# Patient Record
Sex: Female | Born: 1960 | Race: White | Hispanic: No | Marital: Married | Smoking: Current every day smoker
Health system: Southern US, Community
[De-identification: ages and names within clinical notes are randomized; demographics above are authoritative.]

## PROBLEM LIST (undated history)

## (undated) DIAGNOSIS — F1120 Opioid dependence, uncomplicated: Secondary | ICD-10-CM

## (undated) DIAGNOSIS — M21372 Foot drop, left foot: Secondary | ICD-10-CM

## (undated) DIAGNOSIS — J45909 Unspecified asthma, uncomplicated: Secondary | ICD-10-CM

## (undated) DIAGNOSIS — I10 Essential (primary) hypertension: Secondary | ICD-10-CM

## (undated) DIAGNOSIS — M21371 Foot drop, right foot: Secondary | ICD-10-CM

## (undated) DIAGNOSIS — K219 Gastro-esophageal reflux disease without esophagitis: Secondary | ICD-10-CM

## (undated) DIAGNOSIS — F419 Anxiety disorder, unspecified: Secondary | ICD-10-CM

## (undated) DIAGNOSIS — M549 Dorsalgia, unspecified: Secondary | ICD-10-CM

## (undated) DIAGNOSIS — M1990 Unspecified osteoarthritis, unspecified site: Secondary | ICD-10-CM

## (undated) DIAGNOSIS — R011 Cardiac murmur, unspecified: Secondary | ICD-10-CM

## (undated) DIAGNOSIS — G8929 Other chronic pain: Secondary | ICD-10-CM

## (undated) DIAGNOSIS — F32A Depression, unspecified: Secondary | ICD-10-CM

## (undated) DIAGNOSIS — C4490 Unspecified malignant neoplasm of skin, unspecified: Secondary | ICD-10-CM

## (undated) DIAGNOSIS — Z87891 Personal history of nicotine dependence: Secondary | ICD-10-CM

## (undated) HISTORY — PX: ABDOMINAL HYSTERECTOMY: SHX81

## (undated) HISTORY — DX: Unspecified malignant neoplasm of skin, unspecified: C44.90

## (undated) HISTORY — PX: ANKLE SURGERY: SHX546

## (undated) HISTORY — PX: APPENDECTOMY: SHX54

## (undated) HISTORY — DX: Foot drop, right foot: M21.371

## (undated) HISTORY — DX: Other chronic pain: G89.29

## (undated) HISTORY — DX: Foot drop, right foot: M21.372

## (undated) HISTORY — DX: Unspecified osteoarthritis, unspecified site: M19.90

## (undated) HISTORY — DX: Dorsalgia, unspecified: M54.9

## (undated) HISTORY — DX: Unspecified asthma, uncomplicated: J45.909

## (undated) HISTORY — PX: TENNIS ELBOW RELEASE/NIRSCHEL PROCEDURE: SHX6651

---

## 2004-10-18 ENCOUNTER — Emergency Department: Payer: Self-pay | Admitting: Emergency Medicine

## 2005-03-29 ENCOUNTER — Ambulatory Visit: Payer: Self-pay | Admitting: Family Medicine

## 2005-04-08 ENCOUNTER — Ambulatory Visit: Payer: Self-pay | Admitting: Podiatry

## 2005-08-12 ENCOUNTER — Ambulatory Visit: Payer: Self-pay | Admitting: Otolaryngology

## 2006-03-29 ENCOUNTER — Ambulatory Visit: Payer: Self-pay | Admitting: Podiatry

## 2006-05-31 ENCOUNTER — Ambulatory Visit: Payer: Self-pay | Admitting: Family Medicine

## 2006-06-17 ENCOUNTER — Ambulatory Visit: Payer: Self-pay | Admitting: Podiatry

## 2006-10-11 ENCOUNTER — Ambulatory Visit: Payer: Self-pay | Admitting: Podiatry

## 2006-10-21 ENCOUNTER — Ambulatory Visit: Payer: Self-pay | Admitting: Family Medicine

## 2006-10-25 ENCOUNTER — Ambulatory Visit: Payer: Self-pay | Admitting: Family Medicine

## 2007-01-25 ENCOUNTER — Other Ambulatory Visit: Payer: Self-pay

## 2007-01-25 ENCOUNTER — Ambulatory Visit: Payer: Self-pay | Admitting: Obstetrics and Gynecology

## 2007-02-06 ENCOUNTER — Inpatient Hospital Stay: Payer: Self-pay | Admitting: Obstetrics and Gynecology

## 2007-04-13 ENCOUNTER — Ambulatory Visit: Payer: Self-pay | Admitting: Family Medicine

## 2007-06-07 ENCOUNTER — Ambulatory Visit: Payer: Self-pay | Admitting: Family Medicine

## 2007-06-13 ENCOUNTER — Ambulatory Visit: Payer: Self-pay

## 2007-06-28 ENCOUNTER — Ambulatory Visit: Payer: Self-pay | Admitting: Pain Medicine

## 2007-07-18 ENCOUNTER — Ambulatory Visit: Payer: Self-pay | Admitting: Pain Medicine

## 2007-08-07 ENCOUNTER — Ambulatory Visit: Payer: Self-pay | Admitting: Physician Assistant

## 2007-08-24 ENCOUNTER — Ambulatory Visit: Payer: Self-pay | Admitting: Pain Medicine

## 2007-09-19 ENCOUNTER — Ambulatory Visit: Payer: Self-pay | Admitting: Pain Medicine

## 2007-10-03 ENCOUNTER — Ambulatory Visit: Payer: Self-pay | Admitting: Physician Assistant

## 2007-10-04 ENCOUNTER — Ambulatory Visit: Payer: Self-pay | Admitting: Physician Assistant

## 2007-10-31 ENCOUNTER — Ambulatory Visit: Payer: Self-pay | Admitting: Family Medicine

## 2008-08-09 HISTORY — PX: BACK SURGERY: SHX140

## 2008-11-27 ENCOUNTER — Ambulatory Visit: Payer: Self-pay | Admitting: Family Medicine

## 2009-07-10 ENCOUNTER — Emergency Department: Payer: Self-pay | Admitting: Emergency Medicine

## 2009-10-02 ENCOUNTER — Emergency Department: Payer: Self-pay | Admitting: Emergency Medicine

## 2009-12-02 ENCOUNTER — Ambulatory Visit: Payer: Self-pay | Admitting: Family Medicine

## 2009-12-31 ENCOUNTER — Ambulatory Visit: Payer: Self-pay | Admitting: Unknown Physician Specialty

## 2010-12-07 ENCOUNTER — Ambulatory Visit: Payer: Self-pay | Admitting: Family Medicine

## 2011-05-12 ENCOUNTER — Ambulatory Visit: Payer: Self-pay | Admitting: Bariatrics

## 2011-06-14 ENCOUNTER — Ambulatory Visit: Payer: Self-pay | Admitting: Bariatrics

## 2011-07-10 ENCOUNTER — Ambulatory Visit: Payer: Self-pay | Admitting: Bariatrics

## 2011-07-29 ENCOUNTER — Ambulatory Visit: Payer: Self-pay | Admitting: Gastroenterology

## 2011-08-02 LAB — PATHOLOGY REPORT

## 2011-10-12 ENCOUNTER — Ambulatory Visit: Payer: Self-pay | Admitting: Bariatrics

## 2011-10-19 ENCOUNTER — Inpatient Hospital Stay: Payer: Self-pay | Admitting: Bariatrics

## 2011-10-20 LAB — CBC WITH DIFFERENTIAL/PLATELET
Basophil #: 0 10*3/uL (ref 0.0–0.1)
Basophil %: 0 %
Eosinophil #: 0 10*3/uL (ref 0.0–0.7)
HCT: 39.8 % (ref 35.0–47.0)
HGB: 13.2 g/dL (ref 12.0–16.0)
Lymphocyte #: 1.2 10*3/uL (ref 1.0–3.6)
Lymphocyte %: 7.6 %
MCH: 29 pg (ref 26.0–34.0)
MCHC: 33.1 g/dL (ref 32.0–36.0)
MCV: 88 fL (ref 80–100)
Monocyte %: 4 %
Neutrophil #: 14.3 10*3/uL — ABNORMAL HIGH (ref 1.4–6.5)
RDW: 13.3 % (ref 11.5–14.5)

## 2011-10-20 LAB — BASIC METABOLIC PANEL
BUN: 17 mg/dL (ref 7–18)
Calcium, Total: 8.5 mg/dL (ref 8.5–10.1)
Chloride: 104 mmol/L (ref 98–107)
Co2: 27 mmol/L (ref 21–32)
Creatinine: 1 mg/dL (ref 0.60–1.30)
EGFR (African American): >60
Potassium: 5.5 mmol/L — ABNORMAL HIGH (ref 3.5–5.1)
Sodium: 141 mmol/L (ref 136–145)

## 2011-12-15 ENCOUNTER — Ambulatory Visit: Payer: Self-pay | Admitting: Family Medicine

## 2012-05-02 ENCOUNTER — Emergency Department: Payer: Self-pay | Admitting: Emergency Medicine

## 2012-05-03 LAB — URINALYSIS, COMPLETE
Bacteria: NONE SEEN
Glucose,UR: NEGATIVE mg/dL (ref 0–75)
Leukocyte Esterase: NEGATIVE
Nitrite: NEGATIVE
Specific Gravity: 1.028 (ref 1.003–1.030)
WBC UR: 1 /HPF (ref 0–5)

## 2012-05-03 LAB — DRUG SCREEN, URINE
Amphetamines, Ur Screen: NEGATIVE (ref ?–1000)
Cannabinoid 50 Ng, Ur ~~LOC~~: NEGATIVE (ref ?–50)
MDMA (Ecstasy)Ur Screen: NEGATIVE (ref ?–500)
Methadone, Ur Screen: NEGATIVE (ref ?–300)
Opiate, Ur Screen: POSITIVE (ref ?–300)
Phencyclidine (PCP) Ur S: NEGATIVE (ref ?–25)
Tricyclic, Ur Screen: NEGATIVE (ref ?–1000)

## 2012-05-31 DIAGNOSIS — N809 Endometriosis, unspecified: Secondary | ICD-10-CM | POA: Insufficient documentation

## 2012-05-31 DIAGNOSIS — G8929 Other chronic pain: Secondary | ICD-10-CM | POA: Insufficient documentation

## 2012-05-31 DIAGNOSIS — K219 Gastro-esophageal reflux disease without esophagitis: Secondary | ICD-10-CM | POA: Insufficient documentation

## 2012-05-31 DIAGNOSIS — F17200 Nicotine dependence, unspecified, uncomplicated: Secondary | ICD-10-CM | POA: Insufficient documentation

## 2012-05-31 DIAGNOSIS — I10 Essential (primary) hypertension: Secondary | ICD-10-CM | POA: Insufficient documentation

## 2012-05-31 DIAGNOSIS — C44320 Squamous cell carcinoma of skin of unspecified parts of face: Secondary | ICD-10-CM | POA: Insufficient documentation

## 2012-05-31 DIAGNOSIS — E785 Hyperlipidemia, unspecified: Secondary | ICD-10-CM | POA: Insufficient documentation

## 2013-05-30 ENCOUNTER — Ambulatory Visit: Payer: Self-pay | Admitting: Family Medicine

## 2013-08-22 ENCOUNTER — Ambulatory Visit: Payer: Self-pay | Admitting: Podiatry

## 2013-08-29 ENCOUNTER — Ambulatory Visit: Payer: Self-pay | Admitting: Podiatry

## 2014-10-08 ENCOUNTER — Other Ambulatory Visit: Payer: Self-pay | Admitting: Urgent Care

## 2014-10-24 ENCOUNTER — Ambulatory Visit: Payer: Self-pay | Admitting: Gastroenterology

## 2014-12-01 NOTE — Op Note (Signed)
PATIENT NAME:  Tara Craig, Tara Craig MR#:  [id number] DATE OF BIRTH:  08-04-1961  DATE OF PROCEDURE:  10/19/2011  PROCEDURES PERFORMED:  1. Laparoscopic Roux-en-Y gastric bypass. 2. Repair of hiatal hernia. 3. Lysis of periumbilical and infraumbilical omental adhesions.   PREOPERATIVE DIAGNOSES: Morbid obesity with a BMI of 41 associated with obstructive sleep apnea, hypertension, hyperlipidemia and gastroesophageal reflux disease associated with presence of a hiatal hernia.   ADDITIONAL INTRAOPERATIVE FINDINGS: Omental and small bowel adhesions to the lower anterior abdominal wall associated with prior hysterectomy.   SURGEON: Venia Carbon. Duke Salvia, MD  ASSISTANT: Darrin Luis, PA  SPECIMENS REMOVED: Portion of jejunum.   DESCRIPTION OF PROCEDURE: The patient was brought to the Operating Room, placed in supine position. General anesthesia obtained with oral tracheal intubation. Foley catheter inserted sterilely. TED hose and Thromboguards applied and a foot board placed at the end of the operative bed. The lower chest and abdomen were then sterilely prepped and draped. A 5 mm Optiview trocar introduced under direct visualization in the left upper quadrant and subcostal area. Under direct visualization four additional trocars were introduced across the upper abdomen. Following the last two trocars being introduced patient had division of broad area of omental and small bowel adhesions to the lower central anterior abdominal wall. This was accomplished with introduction of an additional 5 mm trocar in the left lower quadrant of the abdomen to include both exposure and access to these adhesions. There was no evidence of enterotomy in the small bowel which was monitored as the process was ongoing. The division of omentum accomplished by use of the Harmonic scalpel and small bowel being mobilized by sharp dissection. The omentum was elevated in the upper abdomen at completion of introduction of trocars. The  ligament of Treitz was identified. The bowel was followed distally 50 cm at which point it was divided with a white load Ethicon GIA stapler. The arcade vessels being divided by use of the Harmonic scalpel. The bowel was followed distally an additional 100 cm at which point a side-to-side jejunal-jejunal anastomosis was configured. This was accomplished with enterotomy on the antimesenteric border of the biliary limb and common channel portion of jejunum. A white load stapler used to create a common lumen. The resulting enterotomy closed with a repeat firing of white load GIA stapler. The mesenteric window closed with a running 2-0 Ethibond suture and anti-torsion sutures were placed distal to the anastomosis. The transverse colon omentum divided by use of the Harmonic scalpel and the prior mobilized jejunum was secured to the anterior gastric wall for future use. A Nathanson liver retractor was introduced through a subxiphoid wound elevating the left lobe of the liver. Patient noted to have a hiatal hernia as suggested by preoperative upper GI series. Portions of the gastrohepatic ligament incised by use of the Harmonic scalpel and the herniated peritoneum along the anterior hiatus was divided. Blunt dissection was used to separate the herniated peritoneum and the anterior esophagus away from the overlying pericardium. The patient then had division of portions of the phrenoesophageal ligament and the peritoneum overlying the right crus. Circumferential mobilization of the distal esophagus then performed sweeping the esophagus away from the pleural surfaces and the overlying pericardium. Several lymphovascular attachments were divided by use of the Harmonic scalpel. Eventually 2 cm of esophagus lying comfortably in the abdominal cavity. Three interrupted 2-0 Ethibond sutures used to approximate the crural musculature posteriorly. The patient then had division of the arcade vessels along the lesser curvature of the  stomach, approximately 5 cm inferior to be GE junction. The patient then had a gold load GI stapler fired in a transverse pattern to initiate creation of proximal gastric pouch. Two additional staple loads were placed parallel to the lesser curvature creating a small proximal gastric pouch. Seam guard staple reinforcement systems were used. The patient then had creation of a distal posterior gastrojejunal anastomosis accomplished with enterotomies on the distal posterior stomach and the antimesenteric border of the jejunum. A blue load stapler fired at 3 cm mark was used to create the common lumen. The resulting enterotomy closed with an oversewing of 2-0 Vicryl suture. This was accomplished with a 34 French bougie in place. The suture and staple line was then reinforced with an additional 2-0 Vicryl suture. An omental patch was placed over the area of anastomosis using two prior divided limbs of the omentum. This was secured to the anterior gastric wall with a 2-0 Ethibond suture. Petersen defect closed with a running 2-0 Ethibond suture.   It should be mentioned that prior to omental patch being placed an intraoperative endoscopy was performed. There was no evidence of an air leak in the region of the gastrojejunal anastomosis as confirmed by a saline bath. After confirming hemostasis the pneumoperitoneum relieved, the trocars removed. The wounds injected with 0.25% Marcaine with epinephrine. The wound margin closed with 4-0 Monocryl for the dermis followed by Dermabond.    The patient was allowed to recover having tolerated the procedure well. There was minimal blood loss.   ____________________________ Venia Carbon Duke Salvia, MD mat:cms D: 10/25/2011 07:56:27 ET T: 10/25/2011 09:40:18 ET JOB#: [id number]  cc: Legrand Como A. Duke Salvia, MD, <Dictator> Dory Horn. Eliberto Ivory, MD Ladora Daniel MD ELECTRONICALLY SIGNED 11/02/2011 9:42

## 2015-02-01 ENCOUNTER — Emergency Department: Payer: Self-pay

## 2015-02-01 ENCOUNTER — Encounter: Payer: Self-pay | Admitting: Emergency Medicine

## 2015-02-01 ENCOUNTER — Emergency Department
Admission: EM | Admit: 2015-02-01 | Discharge: 2015-02-02 | Disposition: A | Discharge status: Home / Self Care | Payer: Self-pay | Attending: Emergency Medicine | Admitting: Emergency Medicine

## 2015-02-01 DIAGNOSIS — R451 Restlessness and agitation: Secondary | ICD-10-CM | POA: Insufficient documentation

## 2015-02-01 DIAGNOSIS — R4182 Altered mental status, unspecified: Secondary | ICD-10-CM | POA: Insufficient documentation

## 2015-02-01 DIAGNOSIS — R109 Unspecified abdominal pain: Secondary | ICD-10-CM | POA: Insufficient documentation

## 2015-02-01 DIAGNOSIS — R112 Nausea with vomiting, unspecified: Secondary | ICD-10-CM | POA: Insufficient documentation

## 2015-02-01 DIAGNOSIS — Z720 Tobacco use: Secondary | ICD-10-CM | POA: Insufficient documentation

## 2015-02-01 LAB — CBC WITH DIFFERENTIAL/PLATELET
BASOS ABS: 0.1 10*3/uL (ref 0–0.1)
Basophils Relative: 1 %
Eosinophils Absolute: 0.1 10*3/uL (ref 0–0.7)
Eosinophils Relative: 1 %
HCT: 48 % — ABNORMAL HIGH (ref 35.0–47.0)
Hemoglobin: 15.7 g/dL (ref 12.0–16.0)
LYMPHS ABS: 1.3 10*3/uL (ref 1.0–3.6)
LYMPHS PCT: 11 %
MCH: 30 pg (ref 26.0–34.0)
MCHC: 32.7 g/dL (ref 32.0–36.0)
MCV: 91.8 fL (ref 80.0–100.0)
MONO ABS: 0.6 10*3/uL (ref 0.2–0.9)
Monocytes Relative: 5 %
NEUTROS ABS: 10.1 10*3/uL — AB (ref 1.4–6.5)
Neutrophils Relative %: 82 %
PLATELETS: 269 10*3/uL (ref 150–440)
RBC: 5.23 MIL/uL — ABNORMAL HIGH (ref 3.80–5.20)
RDW: 13.7 % (ref 11.5–14.5)
WBC: 12.2 10*3/uL — ABNORMAL HIGH (ref 3.6–11.0)

## 2015-02-01 LAB — URINE DRUG SCREEN, QUALITATIVE (ARMC ONLY)
Amphetamines, Ur Screen: NOT DETECTED
BENZODIAZEPINE, UR SCRN: NOT DETECTED
Barbiturates, Ur Screen: NOT DETECTED
CANNABINOID 50 NG, UR ~~LOC~~: NOT DETECTED
COCAINE METABOLITE, UR ~~LOC~~: NOT DETECTED
MDMA (ECSTASY) UR SCREEN: NOT DETECTED
Methadone Scn, Ur: NOT DETECTED
Opiate, Ur Screen: NOT DETECTED
Phencyclidine (PCP) Ur S: NOT DETECTED
Tricyclic, Ur Screen: NOT DETECTED

## 2015-02-01 LAB — COMPREHENSIVE METABOLIC PANEL
ALK PHOS: 79 U/L (ref 38–126)
ALT: 19 U/L (ref 14–54)
AST: 24 U/L (ref 15–41)
Albumin: 4.7 g/dL (ref 3.5–5.0)
Anion gap: 11 (ref 5–15)
BUN: 11 mg/dL (ref 6–20)
CO2: 24 mmol/L (ref 22–32)
CREATININE: 0.65 mg/dL (ref 0.44–1.00)
Calcium: 9.2 mg/dL (ref 8.9–10.3)
Chloride: 104 mmol/L (ref 101–111)
GFR calc Af Amer: >60 mL/min (ref >60)
GFR calc non Af Amer: >60 mL/min (ref >60)
Glucose, Bld: 113 mg/dL — ABNORMAL HIGH (ref 65–99)
Potassium: 3.9 mmol/L (ref 3.5–5.1)
SODIUM: 139 mmol/L (ref 135–145)
Total Bilirubin: 0.7 mg/dL (ref 0.3–1.2)
Total Protein: 7.5 g/dL (ref 6.5–8.1)

## 2015-02-01 LAB — URINALYSIS COMPLETE WITH MICROSCOPIC (ARMC ONLY)
Bacteria, UA: NONE SEEN
Bilirubin Urine: NEGATIVE
Glucose, UA: NEGATIVE mg/dL
HGB URINE DIPSTICK: NEGATIVE
LEUKOCYTES UA: NEGATIVE
Nitrite: NEGATIVE
Protein, ur: NEGATIVE mg/dL
RBC / HPF: NONE SEEN RBC/hpf (ref 0–5)
Specific Gravity, Urine: 1.011 (ref 1.005–1.030)
pH: 8 (ref 5.0–8.0)

## 2015-02-01 LAB — LIPASE, BLOOD: Lipase: 33 U/L (ref 22–51)

## 2015-02-01 LAB — ETHANOL: Alcohol, Ethyl (B): <5 mg/dL (ref <5)

## 2015-02-01 MED ORDER — ONDANSETRON HCL 4 MG/2ML IJ SOLN
4.0000 mg | Freq: Once | INTRAMUSCULAR | Status: AC
  Administered 2015-02-01: 4 mg via INTRAVENOUS

## 2015-02-01 MED ORDER — NALOXONE HCL 1 MG/ML IJ SOLN
0.4000 mg | Freq: Once | INTRAMUSCULAR | Status: AC
  Administered 2015-02-01: 0.4 mg via INTRAVENOUS

## 2015-02-01 MED ORDER — NALOXONE HCL 1 MG/ML IJ SOLN
INTRAMUSCULAR | Status: AC
  Filled 2015-02-01: qty 2

## 2015-02-01 MED ORDER — ONDANSETRON HCL 4 MG/2ML IJ SOLN
INTRAMUSCULAR | Status: AC
  Administered 2015-02-01: 4 mg via INTRAVENOUS
  Filled 2015-02-01: qty 2

## 2015-02-01 NOTE — ED Notes (Signed)
Pt went to restroom when RN was not in room . Urine sample not collected. Hat placed in toilet. Family at bedside.

## 2015-02-01 NOTE — ED Provider Notes (Signed)
[facility] Emergency Department Provider Note     Time seen: ----------------------------------------- 5:54 PM on 02/01/2015 -----------------------------------------    I have reviewed the triage vital signs and the nursing notes. Level V caveat: Review of systems and history is difficult to obtain. Patient has altered mental status  HISTORY  Chief Complaint Abdominal Pain and Emesis    HPI KATORIA YETMAN is a 54 y.o. female who presents ER for nausea vomiting and upper abdominal pain. Patient is a poor historian, only complains of nausea. Will not describe her pain. She is constipated as being in the middle of her abdomen. She is brought in hypertensive, noted that she does not have a history of hypertension.   No past medical history on file.  There are no active problems to display for this patient.   No past surgical history on file.  Allergies Review of patient's allergies indicates no known allergies.  Social History History  Substance Use Topics  . Smoking status: Current Every Day Smoker  . Smokeless tobacco: Not on file  . Alcohol Use: No    Review of Systems Constitutional: Negative for fever. Eyes: Negative for visual changes. ENT: Negative for sore throat. Cardiovascular: Negative for chest pain. Respiratory: Negative for shortness of breath. Gastrointestinal: Positive for abdominal pain nausea. Genitourinary: Negative for dysuria. Musculoskeletal: Negative for back pain. Skin: Negative for rash. Neurological: Negative for headaches, positive for weakness  10-point ROS otherwise negative.  ____________________________________________   PHYSICAL EXAM:  VITAL SIGNS: ED Triage Vitals  Enc Vitals Group     BP 02/01/15 1743 199/101 mmHg     Pulse Rate 02/01/15 1743 86     Resp 02/01/15 1743 18     Temp 02/01/15 1743 98.1 F (36.7 C)     Temp Source 02/01/15 1743 Oral     SpO2 02/01/15 1743 96 %     Weight --       Height --      Head Cir --      Peak Flow --      Pain Score 02/01/15 1744 10     Pain Loc --      Pain Edu? --      Excl. in [place]? --     Constitutional: Alert and oriented. Well appearing and in no distress. Eyes: Conjunctivae are normal. PERRL. Normal extraocular movements. ENT   Head: Normocephalic and atraumatic.   Nose: No congestion/rhinnorhea.   Mouth/Throat: Mucous membranes are moist.   Neck: No stridor. Hematological/Lymphatic/Immunilogical: No cervical lymphadenopathy. Cardiovascular: Normal rate, regular rhythm. Normal and symmetric distal pulses are present in all extremities. No murmurs, rubs, or gallops. Respiratory: Normal respiratory effort without tachypnea nor retractions. Breath sounds are clear and equal bilaterally. No wheezes/rales/rhonchi. Gastrointestinal: PeriUmbilical tenderness, no rebound or guarding. Normal bowel sounds. Musculoskeletal: Nontender with normal range of motion in all extremities. No joint effusions.  No lower extremity tenderness nor edema. Neurologic:  No gross focal neurologic deficits are appreciated. . Skin:  Skin is warm, dry and intact. No rash noted. Psychiatric: Bizarre mood and affect, patient will not effectively communicate with me. Unclear etiology. ____________________________________________  EKG: Interpreted by me. Normal sinus rhythm with marked sinus arrhythmia, rate is 69, otherwise normal axis normal intervals, no evidence of hypertrophy or acute infarction.  ____________________________________________  ED COURSE:  Pertinent labs & imaging results that were available during my care of the patient were reviewed by me and considered in my medical decision making (see chart for details). Patient  with likely narcotic withdrawal. Reportedly has chronic pain and has run out of her pain medicine ____________________________________________    LABS (pertinent positives/negatives)  Labs Reviewed  CBC WITH  DIFFERENTIAL/PLATELET - Abnormal; Notable for the following:    WBC 12.2 (*)    RBC 5.23 (*)    HCT 48.0 (*)    Neutro Abs 10.1 (*)    All other components within normal limits  COMPREHENSIVE METABOLIC PANEL - Abnormal; Notable for the following:    Glucose, Bld 113 (*)    All other components within normal limits  LIPASE, BLOOD  URINALYSIS COMPLETEWITH MICROSCOPIC ([facility] ONLY)  ETHANOL  URINE DRUG SCREEN, QUALITATIVE ([facility] ONLY)    RADIOLOGY Images were viewed by me  None  FINDINGS: Multiplanar reconstruction was performed to standardize the imaging plane.  The brainstem, cerebellum, cerebral peduncles, thalamus, basal ganglia, basilar cisterns, and ventricular system appear within normal limits. 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  No intracranial hemorrhage, mass lesion, or acute CVA.  Chronic ethmoid and right sphenoid sinusitis.  IMPRESSION: 1. No acute intracranial findings. 2. Chronic ethmoid and right sphenoid sinusitis. 3. 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  CT abdomen/pelvis IMPRESSION: 1. Prominent stool throughout the colon favors constipation. No dilated bowel identified. 2. Postoperative findings from prior gastric bypass. 3. Aortoiliac atherosclerotic vascular disease. 4. Small amount of free pelvic fluid, etiology uncertain. ____________________________________________  FINAL ASSESSMENT AND PLAN  Abdominal pain and nausea, altered mental status, possible overdose  Plan: Suspect narcotic withdrawal in addition to possible ingestion.. Patient with labs as dictated above. Family notes she has taken methocarbamol as well. This is a possible overdose. We'll IVC the patient until her mental status returns to baseline. Earleen Newport, MD   Earleen Newport, MD 02/01/15  [id number]

## 2015-02-01 NOTE — BH Assessment (Signed)
Assessment Note  Tara Craig is an 54 y.o. female. Patient reports to the ED with the complaint of abdominal pain.  Patient was unable to voice her responses to the TTS. Family members provided information on the patients history and symptoms.  Tara Craig is reported as being depressed with her symptoms steadily increasing.  Her family denied knowledge of her being anxious, homicidal or suicidal.  Her family denied Tara Craig had auditory or visual hallucinations.  She is reported as attending a pain clinic in [place] for back pain. She is currently prescribed Oxycotin and has been taking Robaxin (Methocarbinol).  Axis I: Depressive Disorder NOS Axis II: Deferred Axis III: No past medical history on file. Axis IV: other psychosocial or environmental problems and problems with primary support group Axis V: 41-50 serious symptoms  Past Medical History: No past medical history on file.  No past surgical history on file.  Family History: No family history on file.  Social History:  reports that she has been smoking.  She does not have any smokeless tobacco history on file. She reports that she does not drink alcohol or use illicit drugs.  Additional Social History:  Alcohol / Drug Use History of alcohol / drug use?: No history of alcohol / drug abuse  CIWA: CIWA-Ar BP: (!) 189/95 mmHg Pulse Rate: (!) 53 COWS:    Allergies: No Known Allergies  Home Medications:  (Not in a hospital admission)  OB/GYN Status:  No LMP recorded. Patient has had a hysterectomy.  General Assessment Data Location of Assessment: [facility] ED TTS Assessment: In system Is this a Tele or Face-to-Face Assessment?: Face-to-Face Is this an Initial Assessment or a Re-assessment for this encounter?: Initial Assessment Marital status: Married [employer] name: [name] Is patient pregnant?: No Pregnancy Status: No Living Arrangements: Spouse/significant other Can pt return to current living arrangement?:  Yes Admission Status: Voluntary Is patient capable of signing voluntary admission?: Yes Referral Source: MD  Medical Screening Exam ([place]) Medical Exam completed: Yes  Crisis Care Plan Living Arrangements: Spouse/significant other Name of Psychiatrist: None reported Name of Therapist: None reported  Education Status Is patient currently in school?: Yes Current Grade: 9th Highest grade of school patient has completed: 8th Name of school: [employer], [place], [place] Contact person: n/a  Risk to self with the past 6 months Suicidal Ideation: No Has patient been a risk to self within the past 6 months prior to admission? : No Suicidal Intent: No Has patient had any suicidal intent within the past 6 months prior to admission? : No Is patient at risk for suicide?: No Suicidal Plan?: No Has patient had any suicidal plan within the past 6 months prior to admission? : No Access to Means: No What has been your use of drugs/alcohol within the last 12 months?: none Previous Attempts/Gestures: No How many times?: 0 Other Self Harm Risks: None reported Triggers for Past Attempts: None known Family Suicide History: No Recent stressful life event(s):  (None reported) Persecutory voices/beliefs?: No Depression: Yes Depression Symptoms: Feeling angry/irritable, Despondent Substance abuse history and/or treatment for substance abuse?: No Suicide prevention information given to non-admitted patients: Not applicable  Risk to Others within the past 6 months Homicidal Ideation: No Does patient have any lifetime risk of violence toward others beyond the six months prior to admission? : No Thoughts of Harm to Others: No Current Homicidal Intent: No Current Homicidal Plan: No Access to Homicidal Means: No Identified Victim: None reported History of harm to  others?: No Assessment of Violence: None Noted Violent Behavior Description: None reported Does patient have access to weapons?:  No Criminal Charges Pending?: No Does patient have a court date: No Is patient on probation?: No  Psychosis Hallucinations: None noted Delusions: None noted  Mental Status Report Appearance/Hygiene: In scrubs Eye Contact: Poor Motor Activity: Restlessness (Writhing around in pain) Speech: Unable to assess Level of Consciousness: Alert Mood:  (In pain) Affect: Irritable Anxiety Level: None Thought Processes: Unable to Assess Judgement: Unable to Assess Orientation: Unable to assess Obsessive Compulsive Thoughts/Behaviors: Unable to Assess  Cognitive Functioning Sleep: Decreased  ADLScreening [facility] Assessment Services) Patient's cognitive ability adequate to safely complete daily activities?:  (Unknown) Patient able to express need for assistance with ADLs?:  (Not at this time) Independently performs ADLs?: Yes (appropriate for developmental age) (Generally independent, Currently in pain )  Prior Inpatient Therapy Prior Inpatient Therapy: No  Prior Outpatient Therapy Prior Outpatient Therapy: No Does patient have an ACCT team?: No Does patient have Intensive In-House Services?  : No Does patient have [facility] services? : No Does patient have P4CC services?: No  ADL Screening (condition at time of admission) Patient's cognitive ability adequate to safely complete daily activities?:  (Unknown) Patient able to express need for assistance with ADLs?:  (Not at this time) Independently performs ADLs?: Yes (appropriate for developmental age) (Generally independent, Currently in pain )       Abuse/Neglect Assessment (Assessment to be complete while patient is alone) Physical Abuse: Denies Verbal Abuse: Denies Sexual Abuse: Denies Exploitation of patient/patient's resources: Denies Self-Neglect: Denies Values / Beliefs Cultural Requests During Hospitalization: None Spiritual Requests During Hospitalization: None        Additional Information 1:1 In Past 12 Months?:  No CIRT Risk: No Elopement Risk: No Does patient have medical clearance?: Yes     Disposition:  Disposition Initial Assessment Completed for this Encounter: Yes Disposition of Patient:  (To be seen by the psychiatrist)  On Site Evaluation by:   Reviewed with Physician:    Guerry Minors 02/01/2015 11:44 PM

## 2015-02-01 NOTE — ED Notes (Addendum)
Pt currently responding to friends questions through grunting. Pt refusing to talk but will grunt at appropriate times to acknowledge questions for example, friend asked pt "do you know my name" and pt grunted in response. Pt sitting up in bed and gagging. Clear saliva spit produced from gagging.

## 2015-02-01 NOTE — ED Notes (Signed)
MD and RN at bedside. Family asked to step out of room in order for MD to privately talk to pt. Pt unwilling to talk to MD. After multiple sternal rubs pt opened eyes and began answering simple questions such as "do you want to be IVCd?" Pt still unable to verbally tell MD what is causing grunting and iratic behaviors. Pt informed of need for psych consult and family informed of what happened during interaction as well as need for psych evaluation.

## 2015-02-01 NOTE — ED Notes (Signed)
Pt to ED with c/o n,v and upper abd. Pain " for a couple of days", pt hypertensive in triage, no hx of hypertension

## 2015-02-01 NOTE — ED Notes (Signed)
Pt refused to get off toilet. Pt lifted off toilet and assisted to ambulate back into bed. Pt lying in bed at the moment with family at bedside.

## 2015-02-02 MED ORDER — ACETAMINOPHEN 500 MG PO TABS
1000.0000 mg | ORAL_TABLET | Freq: Once | ORAL | Status: AC
  Administered 2015-02-02: 1000 mg via ORAL

## 2015-02-02 MED ORDER — DOCUSATE SODIUM 100 MG PO CAPS: 100.0000 mg | ORAL_CAPSULE | Freq: Every day | ORAL | Status: DC | PRN

## 2015-02-02 MED ORDER — ACETAMINOPHEN 500 MG PO TABS
ORAL_TABLET | ORAL | Status: AC
  Administered 2015-02-02: 1000 mg via ORAL
  Filled 2015-02-02: qty 2

## 2015-02-02 NOTE — ED Notes (Signed)
BEHAVIORAL HEALTH ROUNDING Patient sleeping: Yes.   Patient alert and oriented: not applicable Behavior appropriate: Yes.  ; If no, describe:  Nutrition and fluids offered: No Toileting and hygiene offered: No Sitter present: no Law enforcement present: Yes  

## 2015-02-02 NOTE — ED Provider Notes (Signed)
-----------------------------------------   8:22 AM on 02/02/2015 -----------------------------------------  Haldol given overnight for agitation with good effect. No further events overnight. Patient rested well in no acute distress. Remains under IVC pending psychiatry evaluation this morning.  Paulette Blanch, MD 02/02/15 [phone]

## 2015-02-02 NOTE — ED Notes (Signed)
Pt on cardiac monitor; resting quietly with eyes closed and resp even and nonlabored; rover outside room and can see pt clearly

## 2015-02-02 NOTE — ED Notes (Signed)
Pt ate 60% of lunch

## 2015-02-02 NOTE — ED Notes (Signed)
ENVIRONMENTAL ASSESSMENT Potentially harmful objects out of patient reach: No. Personal belongings secured: Yes.   Patient dressed in hospital provided attire only: Yes.   Plastic bags out of patient reach: Yes.   Patient care equipment (cords, cables, call bells, lines, and drains) shortened, removed, or accounted for: No. Equipment and supplies removed from bottom of stretcher: Yes.   Potentially toxic materials out of patient reach: Yes.   Sharps container removed or out of patient reach: Yes.

## 2015-02-02 NOTE — ED Notes (Signed)
BEHAVIORAL HEALTH ROUNDING Patient sleeping: Yes.   Patient alert and oriented: not applicable Behavior appropriate: Yes.  ; If no, describe: sleeping Nutrition and fluids offered: No Toileting and hygiene offered: No Sitter present: yes Law enforcement present: Yes

## 2015-02-02 NOTE — ED Notes (Signed)
Pt up to bathroom. No distress noted

## 2015-02-02 NOTE — Discharge Instructions (Signed)

## 2015-02-02 NOTE — ED Notes (Signed)
BEHAVIORAL HEALTH ROUNDING Patient sleeping: Yes.   Patient alert and oriented: not applicable SLEEPING Behavior appropriate: Yes.  ; If no, describe: SLEEPING Nutrition and fluids offered: No SLEEPING Toileting and hygiene offered: NoSLEEPING Sitter present: not applicable Law enforcement present: Yes ODS 

## 2015-02-02 NOTE — ED Notes (Signed)
Pt sleeping in bed, respirations even and unlabored

## 2015-02-02 NOTE — ED Notes (Signed)
Into room 17 and introduced self to patient and husband. Pt moaning and writhing around on the bed at this time and not answering questions. Explained to husband about visitation policies for pts under IVC and husband is understanding. Pt husband took jewelry that Larene Beach, RN removed from the patient. Clothes labled and  left and locked in holding room.

## 2015-02-02 NOTE — Consult Note (Signed)
[facility] Consult   Reason for Consult:  Follow up Referring Physician:  ER Patient Identification: Tara HARLIN MRN:  [id number] Principal Diagnosis: <principal problem not specified> Diagnosis:  There are no active problems to display for this patient.   Total Time spent with patient: 45 minutes  Subjective:   Tara Craig is a 54 y.o. female patient admitted who is married for 17 yrs and lives with husband. Pt reports that she had abdominal pain since 2008 and pain got worse and came to Er for help. She c/o-constipation.Marland Kitchen  HPI:  Pt reports that she never had any previous mental illness and she does not remember what she said and wanted to be out of this place. She had abdominal pain since 2008 that was not checked out and staff reports that [place] showed "constipation. Pt reports that she feels that she is ready to be "out of this crazy place and go home,." Denies any substance abuse. HPI Elements:     Past Medical History: No past medical history on file. No past surgical history on file. Family History: No family history on file. Social History:  History  Alcohol Use No     History  Drug Use No    History   Social History  . Marital Status: Married    Spouse Name: N/A  . Number of Children: N/A  . Years of Education: N/A   Social History Main Topics  . Smoking status: Current Every Day Smoker  . Smokeless tobacco: Not on file  . Alcohol Use: No  . Drug Use: No  . Sexual Activity: Not on file   Other Topics Concern  . None   Social History Narrative  . None   Additional Social History:    History of alcohol / drug use?: No history of alcohol / drug abuse                     Allergies:  No Known Allergies  Labs:  Results for orders placed or performed during the hospital encounter of 02/01/15 (from the past 48 hour(s))  CBC with Differential     Status: Abnormal   Collection Time: 02/01/15  5:51 PM  Result Value Ref Range   WBC 12.2 (H) 3.6 - 11.0 K/uL   RBC 5.23 (H) 3.80 - 5.20 MIL/uL   Hemoglobin 15.7 12.0 - 16.0 g/dL   HCT 48.0 (H) 35.0 - 47.0 %   MCV 91.8 80.0 - 100.0 fL   MCH 30.0 26.0 - 34.0 pg   MCHC 32.7 32.0 - 36.0 g/dL   RDW 13.7 11.5 - 14.5 %   Platelets 269 150 - 440 K/uL   Neutrophils Relative % 82 %   Neutro Abs 10.1 (H) 1.4 - 6.5 K/uL   Lymphocytes Relative 11 %   Lymphs Abs 1.3 1.0 - 3.6 K/uL   Monocytes Relative 5 %   Monocytes Absolute 0.6 0.2 - 0.9 K/uL   Eosinophils Relative 1 %   Eosinophils Absolute 0.1 0 - 0.7 K/uL   Basophils Relative 1 %   Basophils Absolute 0.1 0 - 0.1 K/uL  Comprehensive metabolic panel     Status: Abnormal   Collection Time: 02/01/15  5:51 PM  Result Value Ref Range   Sodium 139 135 - 145 mmol/L   Potassium 3.9 3.5 - 5.1 mmol/L   Chloride 104 101 - 111 mmol/L   CO2 24 22 - 32 mmol/L   Glucose, Bld 113 (H) 65 -  99 mg/dL   BUN 11 6 - 20 mg/dL   Creatinine, Ser 0.65 0.44 - 1.00 mg/dL   Calcium 9.2 8.9 - 10.3 mg/dL   Total Protein 7.5 6.5 - 8.1 g/dL   Albumin 4.7 3.5 - 5.0 g/dL   AST 24 15 - 41 U/L   ALT 19 14 - 54 U/L   Alkaline Phosphatase 79 38 - 126 U/L   Total Bilirubin 0.7 0.3 - 1.2 mg/dL   GFR calc non Af Amer >60 >60 mL/min   GFR calc Af Amer >60 >60 mL/min    Comment: (NOTE) The eGFR has been calculated using the CKD EPI equation. This calculation has not been validated in all clinical situations. eGFR's persistently <60 mL/min signify possible Chronic Kidney Disease.    Anion gap 11 5 - 15  Lipase, blood     Status: None   Collection Time: 02/01/15  5:51 PM  Result Value Ref Range   Lipase 33 22 - 51 U/L  Ethanol     Status: None   Collection Time: 02/01/15  5:51 PM  Result Value Ref Range   Alcohol, Ethyl (B) <5 <5 mg/dL    Comment:        LOWEST DETECTABLE LIMIT FOR SERUM ALCOHOL IS 5 mg/dL FOR MEDICAL PURPOSES ONLY   Urinalysis complete, with microscopic     Status: Abnormal   Collection Time: 02/01/15  8:28 PM  Result  Value Ref Range   Color, Urine YELLOW (A) YELLOW   APPearance HAZY (A) CLEAR   Glucose, UA NEGATIVE NEGATIVE mg/dL   Bilirubin Urine NEGATIVE NEGATIVE   Ketones, ur 1+ (A) NEGATIVE mg/dL   Specific Gravity, Urine 1.011 1.005 - 1.030   Hgb urine dipstick NEGATIVE NEGATIVE   pH 8.0 5.0 - 8.0   Protein, ur NEGATIVE NEGATIVE mg/dL   Nitrite NEGATIVE NEGATIVE   Leukocytes, UA NEGATIVE NEGATIVE   RBC / HPF NONE SEEN 0 - 5 RBC/hpf   WBC, UA 0-5 0 - 5 WBC/hpf   Bacteria, UA NONE SEEN NONE SEEN   Squamous Epithelial / LPF 6-30 (A) NONE SEEN  Urine Drug Screen, Qualitative ([facility] only)     Status: None   Collection Time: 02/01/15  8:28 PM  Result Value Ref Range   Tricyclic, Ur Screen NONE DETECTED NONE DETECTED   Amphetamines, Ur Screen NONE DETECTED NONE DETECTED   MDMA (Ecstasy)Ur Screen NONE DETECTED NONE DETECTED   Cocaine Metabolite,Ur [place] NONE DETECTED NONE DETECTED   Opiate, Ur Screen NONE DETECTED NONE DETECTED   Phencyclidine (PCP) Ur S NONE DETECTED NONE DETECTED   Cannabinoid 50 Ng, Ur [place] NONE DETECTED NONE DETECTED   Barbiturates, Ur Screen NONE DETECTED NONE DETECTED   Benzodiazepine, Ur Scrn NONE DETECTED NONE DETECTED   Methadone Scn, Ur NONE DETECTED NONE DETECTED    Comment: (NOTE) 660  Tricyclics, urine               Cutoff 1000 ng/mL 200  Amphetamines, urine             Cutoff 1000 ng/mL 300  MDMA (Ecstasy), urine           Cutoff 500 ng/mL 400  Cocaine Metabolite, urine       Cutoff 300 ng/mL 500  Opiate, urine                   Cutoff 300 ng/mL 600  Phencyclidine (PCP), urine      Cutoff 25 ng/mL 700  Cannabinoid, urine  Cutoff 50 ng/mL 800  Barbiturates, urine             Cutoff 200 ng/mL 900  Benzodiazepine, urine           Cutoff 200 ng/mL 1000 Methadone, urine                Cutoff 300 ng/mL 1100 1200 The urine drug screen provides only a preliminary, unconfirmed 1300 analytical test result and should not be used for non-medical 1400 purposes.  Clinical consideration and professional judgment should 1500 be applied to any positive drug screen result due to possible 1600 interfering substances. A more specific alternate chemical method 1700 must be used in order to obtain a confirmed analytical result.  1800 Gas chromato graphy / mass spectrometry (GC/MS) is the preferred 1900 confirmatory method.     Vitals: Blood pressure 168/89, pulse 100, temperature 98.1 F (36.7 C), temperature source Oral, resp. rate 18, SpO2 95 %.  Risk to Self: Suicidal Ideation: No Suicidal Intent: No Is patient at risk for suicide?: No Suicidal Plan?: No Access to Means: No What has been your use of drugs/alcohol within the last 12 months?: none How many times?: 0 Other Self Harm Risks: None reported Triggers for Past Attempts: None known Risk to Others: Homicidal Ideation: No Thoughts of Harm to Others: No Current Homicidal Intent: No Current Homicidal Plan: No Access to Homicidal Means: No Identified Victim: None reported History of harm to others?: No Assessment of Violence: None Noted Violent Behavior Description: None reported Does patient have access to weapons?: No Criminal Charges Pending?: No Does patient have a court date: No Prior Inpatient Therapy: Prior Inpatient Therapy: No Prior Outpatient Therapy: Prior Outpatient Therapy: No Does patient have an ACCT team?: No Does patient have Intensive In-House Services?  : No Does patient have [facility] services? : No Does patient have P4[facility] services?: No  No current facility-administered medications for this encounter.   No current outpatient prescriptions on file.    Musculoskeletal: Strength & Muscle Tone: within normal limits Gait & Station: normal Patient leans: N/A  Psychiatric Specialty Exam: Physical Exam  Review of Systems  Constitutional: Negative.   HENT: Negative.   Eyes: Negative.   Respiratory: Negative.   Cardiovascular: Negative.   Gastrointestinal:  Negative.   Genitourinary: Negative.   Musculoskeletal: Negative.   Skin: Negative.   Neurological: Negative.   Endo/Heme/Allergies: Negative.   Psychiatric/Behavioral: The patient is nervous/anxious and has insomnia.     Blood pressure 168/89, pulse 100, temperature 98.1 F (36.7 C), temperature source Oral, resp. rate 18, SpO2 95 %.There is no height or weight on file to calculate BMI.  General Appearance: Casual  Eye Contact::  Fair  Speech:  Clear and Coherent  Volume:  Normal  Mood:  Negative  Affect:  Appropriate  Thought Process:  Negative  Orientation:  Full (Time, Place, and Person)  Thought Content:  Negative  Suicidal Thoughts:  No  Homicidal Thoughts:  No  Memory:  Immediate;   Fair Recent;   Fair Remote;   Fair adequate  Judgement:  Intact  Insight:  Fair  Psychomotor Activity:  Normal  Concentration:  Fair  Recall:  [employer] of Knowledge:Fair  Language: Fair  Akathisia:  No  Handed:  Right  AIMS (if indicated):     Assets:  Architect Housing Social Support Transportation  ADL's:  Intact  Cognition: WNL  Sleep:      Medical Decision Making: Established Problem, Stable/Improving (1)  Treatment Plan Summary: Plan D/C IVC and discharge pt home with family and follow up with PCP for chronic abdominal pain and constipation witout any acute problems according to [place]. Discussed with ER Physician.  Plan:  No evidence of imminent risk to self or others at present.   Disposition: as above  ,  K 02/02/2015 12:21 PM

## 2015-02-02 NOTE — ED Notes (Signed)
BEHAVIORAL HEALTH ROUNDING  Patient sleeping: No.  Patient alert and oriented: unable to determine as pt is refusing to talk or answer questions.  Behavior appropriate: No. ; If no, describe: Pt moaning and writhing around on the bed.  Nutrition and fluids offered: Yes  Toileting and hygiene offered: Yes  Sitter present: not applicable  Law enforcement present: Yes ODS

## 2015-02-02 NOTE — ED Notes (Signed)
BEHAVIORAL HEALTH ROUNDING Patient sleeping: Yes.   Patient alert and oriented: yes Behavior appropriate: Yes.   Nutrition and fluids offered: No, pt sleeping Toileting and hygiene offered: Yes  Sitter present: yes Law enforcement present: Yes

## 2015-02-02 NOTE — ED Notes (Signed)
ENVIRONMENTAL ASSESSMENT  Potentially harmful objects out of patient reach: Yes.  Personal belongings secured: Yes.  Patient dressed in hospital provided attire only: Yes.  Plastic bags out of patient reach: Yes.  Patient care equipment (cords, cables, call bells, lines, and drains) shortened, removed, or accounted for: Yes. Pt on monitor and cords accounted for.  Equipment and supplies removed from bottom of stretcher: Yes.  Potentially toxic materials out of patient reach: Yes.  Sharps container removed or out of patient reach: Yes.

## 2015-02-02 NOTE — ED Provider Notes (Addendum)
-----------------------------------------   12:36 PM on 02/02/2015 -----------------------------------------   BP 168/89 mmHg  Pulse 100  Temp(Src) 98.1 F (36.7 C) (Oral)  Resp 18  SpO2 95%  The patient had no acute events since last update.  Calm and cooperative at this time. No further episodes of agitation. CAT scan reveals constipation but patient also had bowel movement yesterday. We'll discharge with Colace. Per psychiatry the patient is to follow-up with her primary care doctor.   Orbie Pyo, MD 02/02/15 1237  IVC rescinded by Dr. Dillard Cannon. Patient not homicidal or suicidal at this time.  Orbie Pyo, MD 02/02/15 [phone]

## 2015-02-02 NOTE — ED Notes (Signed)
Report to Docia Furl, RN

## 2015-02-02 NOTE — ED Notes (Signed)
Pt resting quietly in bed with eyes closed.

## 2015-02-02 NOTE — ED Notes (Signed)

## 2015-02-02 NOTE — ED Notes (Signed)
BEHAVIORAL HEALTH ROUNDING Patient sleeping: No. Patient alert and oriented: yes Behavior appropriate: Yes.   Nutrition and fluids offered: Yes  Toileting and hygiene offered: Yes  Sitter present: yes Law enforcement present: Yes   Pt given lunch tray

## 2015-09-26 ENCOUNTER — Emergency Department: Payer: No Typology Code available for payment source

## 2015-09-26 ENCOUNTER — Encounter: Payer: Self-pay | Admitting: Emergency Medicine

## 2015-09-26 ENCOUNTER — Emergency Department
Admission: EM | Admit: 2015-09-26 | Discharge: 2015-09-26 | Disposition: A | Discharge status: Home / Self Care | Payer: No Typology Code available for payment source | Attending: Emergency Medicine | Admitting: Emergency Medicine

## 2015-09-26 DIAGNOSIS — S6992XA Unspecified injury of left wrist, hand and finger(s), initial encounter: Secondary | ICD-10-CM | POA: Diagnosis present

## 2015-09-26 DIAGNOSIS — Y9389 Activity, other specified: Secondary | ICD-10-CM | POA: Diagnosis not present

## 2015-09-26 DIAGNOSIS — S60312A Abrasion of left thumb, initial encounter: Secondary | ICD-10-CM | POA: Diagnosis not present

## 2015-09-26 DIAGNOSIS — Y92410 Unspecified street and highway as the place of occurrence of the external cause: Secondary | ICD-10-CM | POA: Diagnosis not present

## 2015-09-26 DIAGNOSIS — S60222A Contusion of left hand, initial encounter: Secondary | ICD-10-CM

## 2015-09-26 DIAGNOSIS — Y998 Other external cause status: Secondary | ICD-10-CM | POA: Insufficient documentation

## 2015-09-26 DIAGNOSIS — F17200 Nicotine dependence, unspecified, uncomplicated: Secondary | ICD-10-CM | POA: Insufficient documentation

## 2015-09-26 DIAGNOSIS — S60012A Contusion of left thumb without damage to nail, initial encounter: Secondary | ICD-10-CM | POA: Diagnosis not present

## 2015-09-26 MED ORDER — ACETAMINOPHEN 325 MG PO TABS
650.0000 mg | ORAL_TABLET | Freq: Once | ORAL | Status: AC
  Administered 2015-09-26: 650 mg via ORAL
  Filled 2015-09-26: qty 2
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
## 2015-10-09 ENCOUNTER — Ambulatory Visit (INDEPENDENT_AMBULATORY_CARE_PROVIDER_SITE_OTHER): Payer: Self-pay | Admitting: Family Medicine

## 2015-10-09 ENCOUNTER — Encounter: Payer: Self-pay | Admitting: Family Medicine

## 2015-10-09 VITALS — BP 168/92 | HR 88 | Resp 15 | Ht 63.0 in | Wt 115.4 lb

## 2015-10-09 DIAGNOSIS — S6992XA Unspecified injury of left wrist, hand and finger(s), initial encounter: Secondary | ICD-10-CM

## 2015-10-09 NOTE — Patient Instructions (Addendum)
ACE wrap to L hand/wrist.  May take Aleve or Advil prn
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
## 2016-06-27 IMAGING — CT CT HEAD W/O CM
1 series · 16 of 28 positions shown, 20 images · non-contrast
Comparison: 08/12/2005

CLINICAL DATA: Nausea and vomiting for 2 days.

EXAM:
CT HEAD WITHOUT CONTRAST
TECHNIQUE: Contiguous axial images were obtained from the base of the skull
through the vertex without intravenous contrast.

[Series 4: head wo · axial · 0.41mm/px · z∈[+493,+609]mm · 16 of 28 slices shown, 20 images]
[im 2/28  brain]
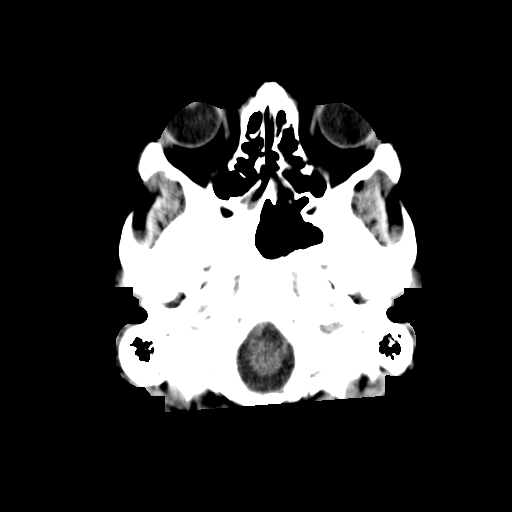
[im 2/28  bone]
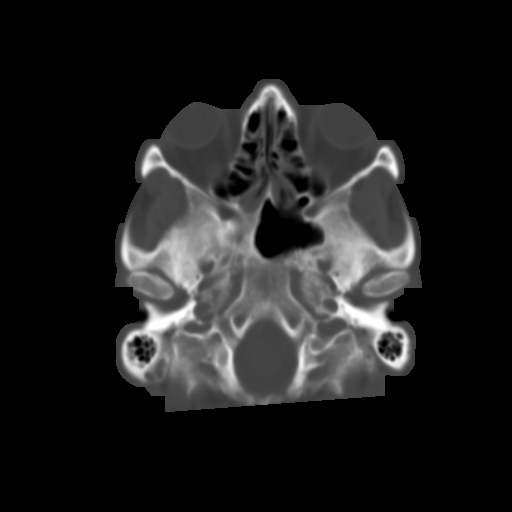
[im 4/28  brain]
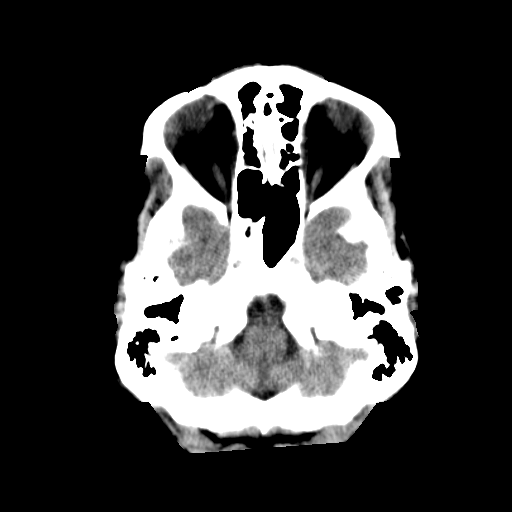
[im 6/28  brain]
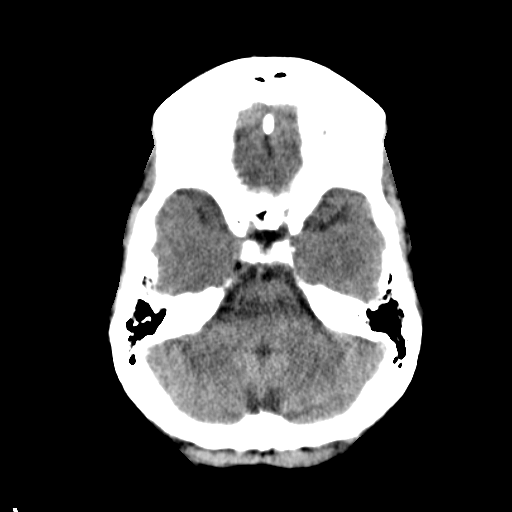
[im 7/28  brain]
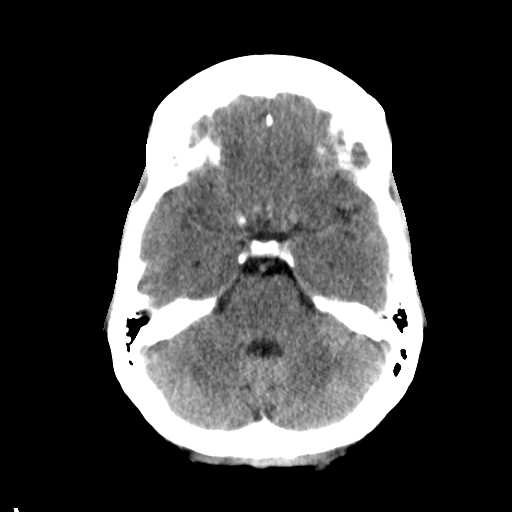
[im 9/28  brain]
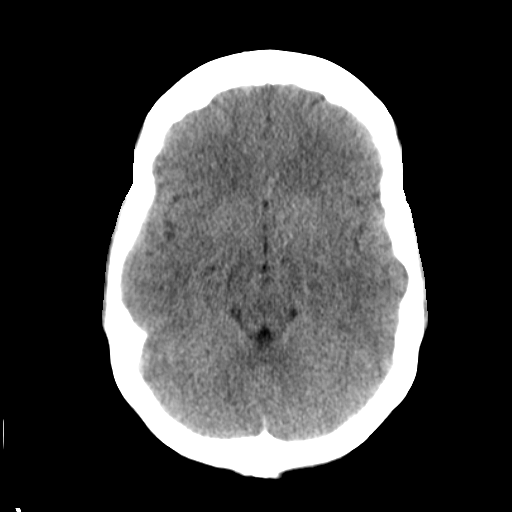
[im 9/28  bone]
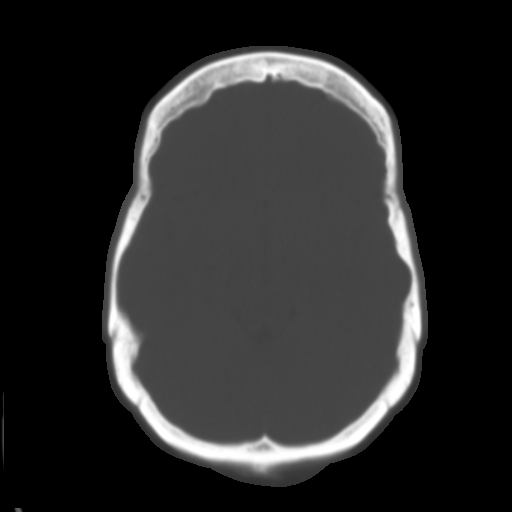
[im 10/28  brain]
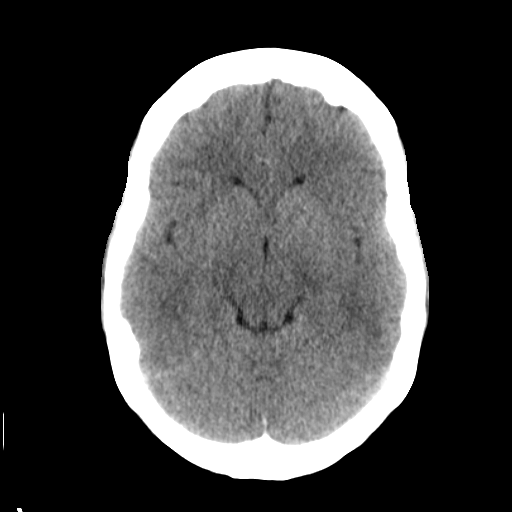
[im 12/28  brain]
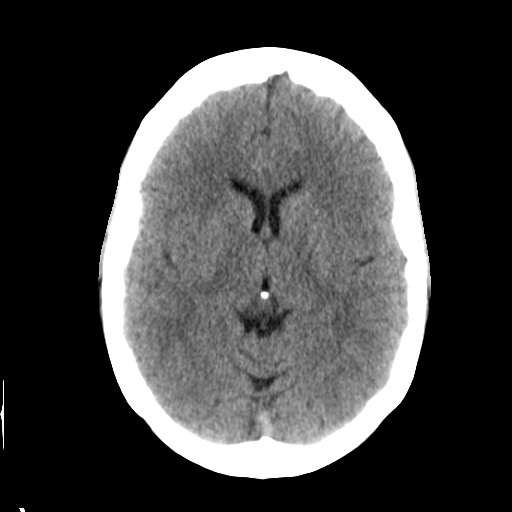
[im 14/28  brain]
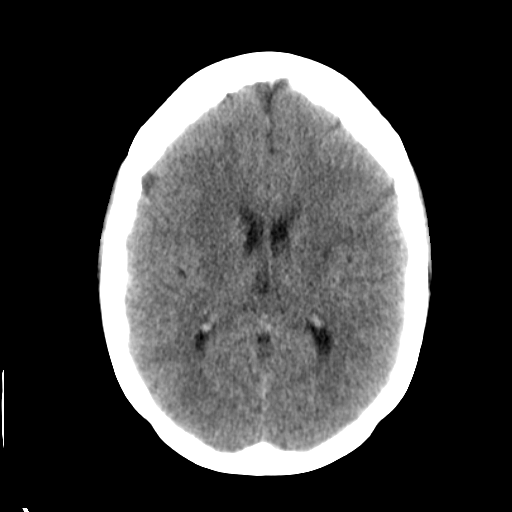
[im 15/28  brain]
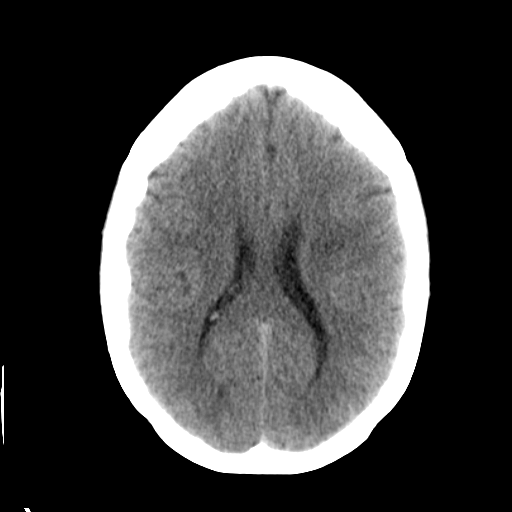
[im 15/28  bone]
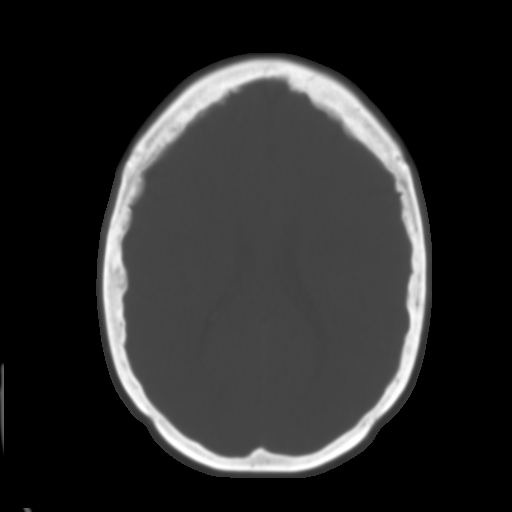
[im 17/28  brain]
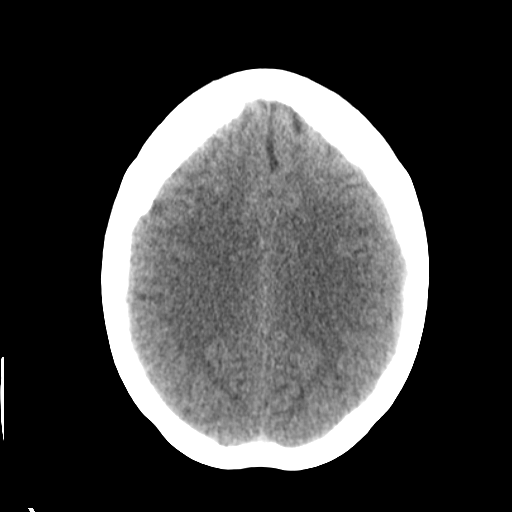
[im 19/28  brain]
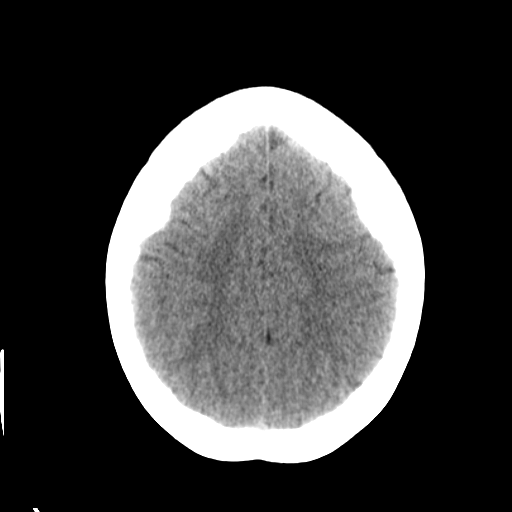
[im 20/28  brain]
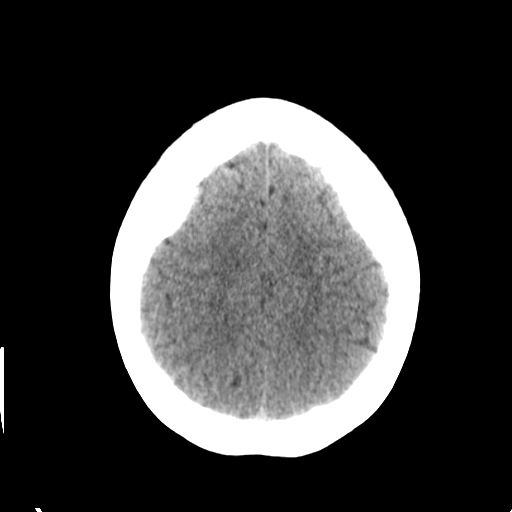
[im 22/28  brain]
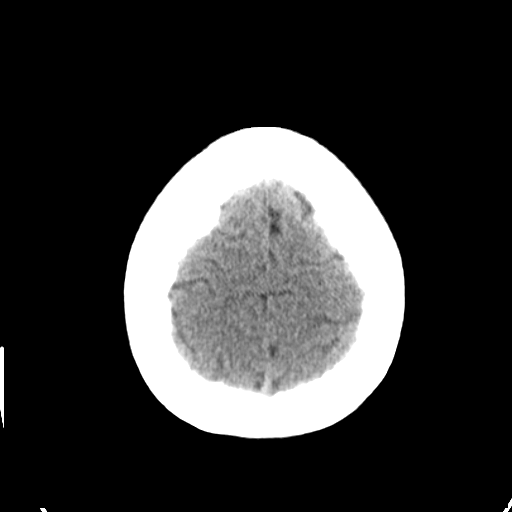
[im 22/28  bone]
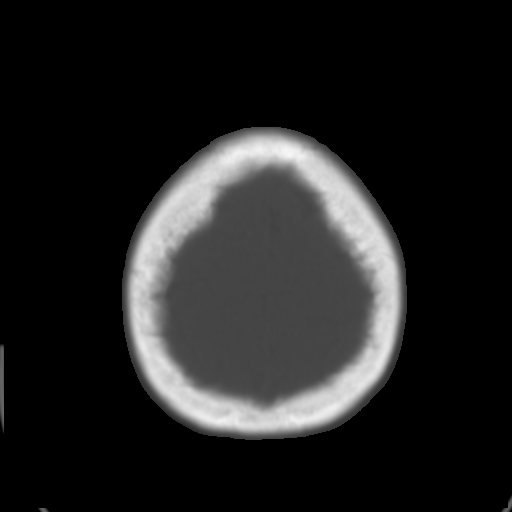
[im 23/28  brain]
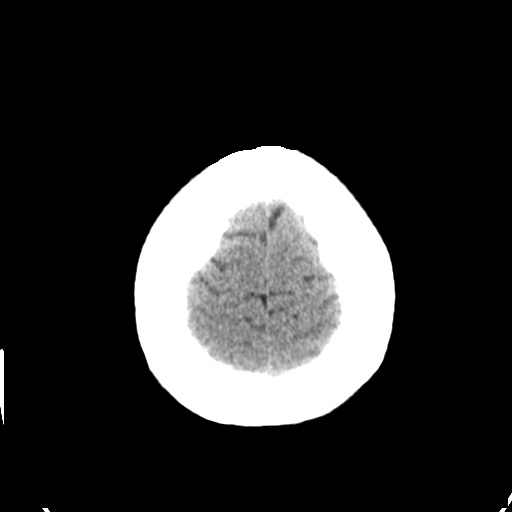
[im 25/28  brain]
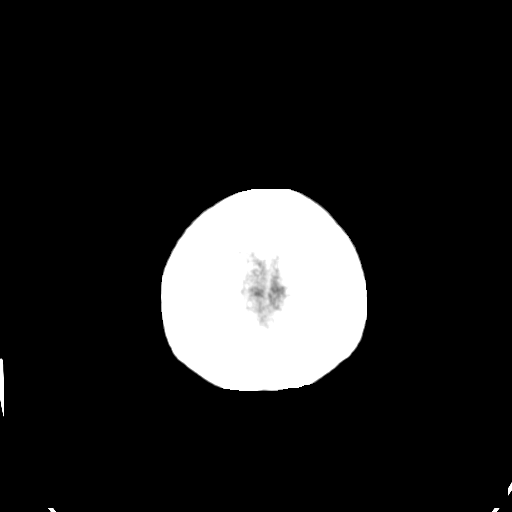
[im 27/28  brain]
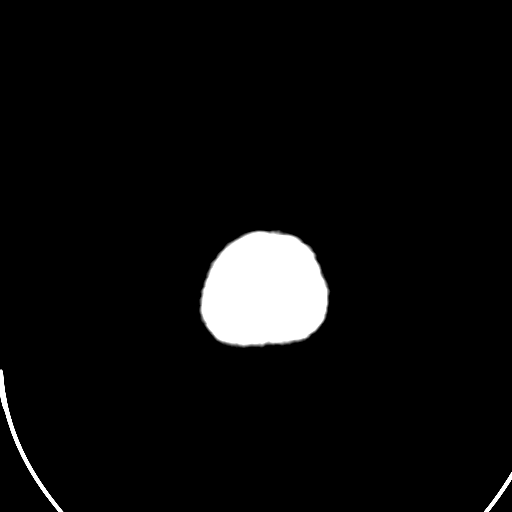

[16 of 28 positions shown; findings below may reference images not displayed]

FINDINGS: Multiplanar reconstruction was performed to standardize the imaging
plane.

The brainstem, cerebellum, cerebral peduncles, thalamus, basal
ganglia, basilar cisterns, and ventricular system appear within
normal limits. Very minimal periventricular white matter hypodensity
is observed, slightly greater on the left than the right, favoring
chronic ischemic microvascular white matter disease.

No intracranial hemorrhage, mass lesion, or acute CVA.

Chronic ethmoid and right sphenoid sinusitis.
IMPRESSION: 1. No acute intracranial findings.
2. Chronic ethmoid and right sphenoid sinusitis.
3. Minimal chronic ischemic microvascular white matter disease.

## 2016-06-27 IMAGING — CT CT ABD-PELV W/O CM
1 of 2 series · 15 of 32 positions shown, 19 images · non-contrast
Comparison: Multiple exams, including 07/10/2009

CLINICAL DATA: Nausea, vomiting, and upper abdominal pain for
several days. Hypertension. History of gastric bypass.

EXAM:
CT ABDOMEN AND PELVIS WITHOUT CONTRAST
TECHNIQUE: Multidetector CT imaging of the abdomen and pelvis was performed
following the standard protocol without IV contrast.

[Series 2: routine abd pel without · axial · non-contrast · 0.63mm/px · z∈[-473,-73]mm · 15 of 88 slices shown, 19 images]
[im 4/88  soft-tissue]
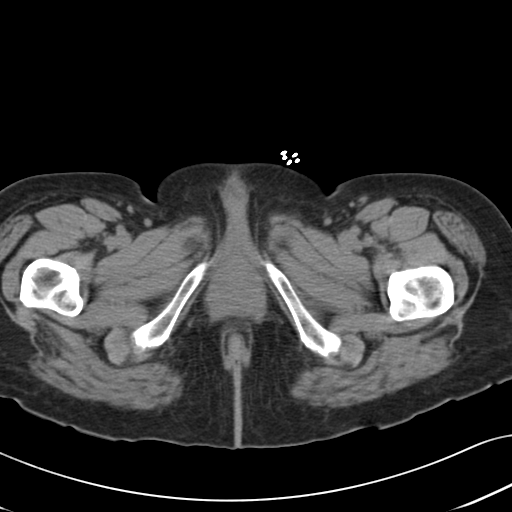
[im 4/88  bone]
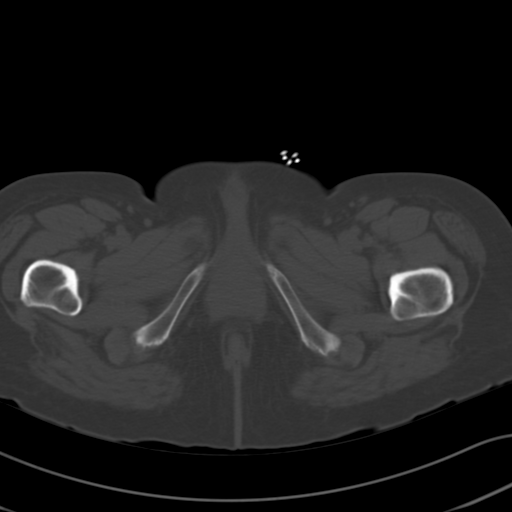
[im 11/88  soft-tissue]
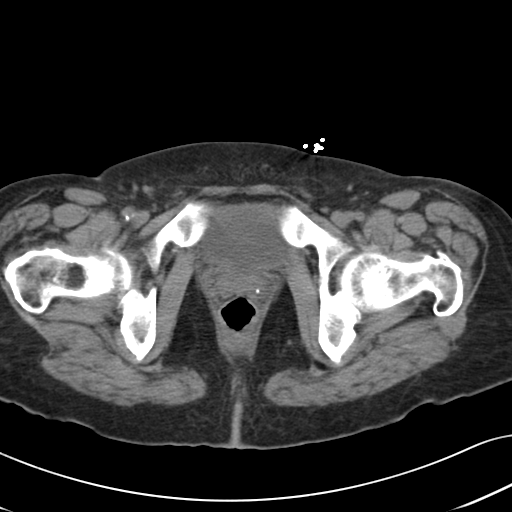
[im 19/88  soft-tissue]
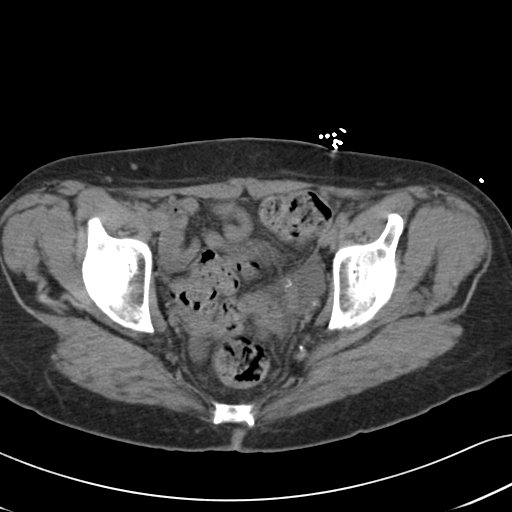
[im 26/88  soft-tissue]
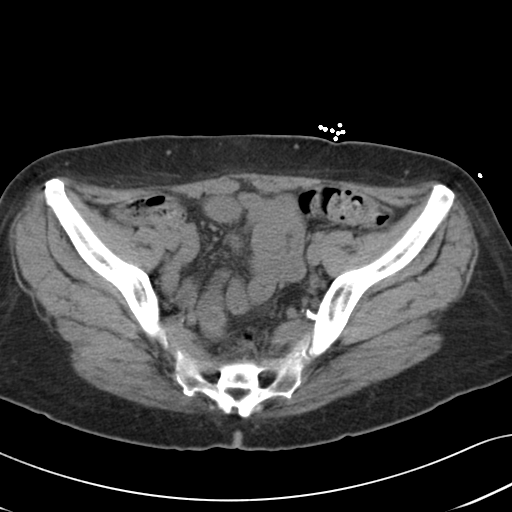
[im 30/88  soft-tissue]
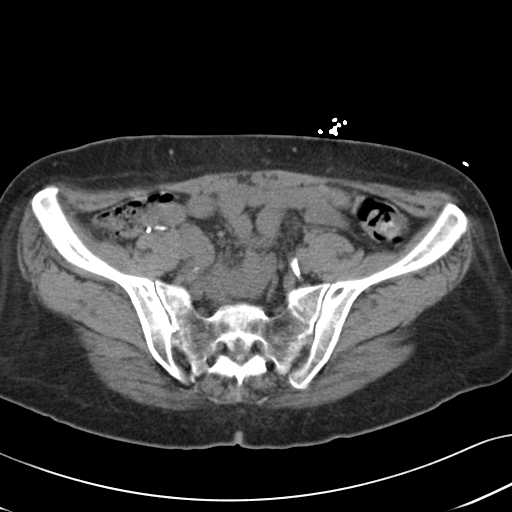
[im 37/88  soft-tissue]
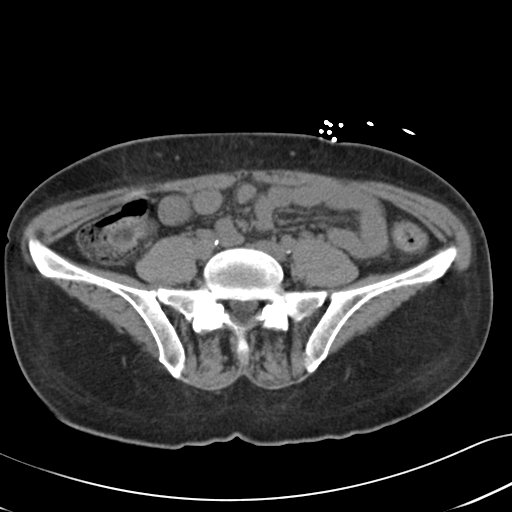
[im 44/88  soft-tissue]
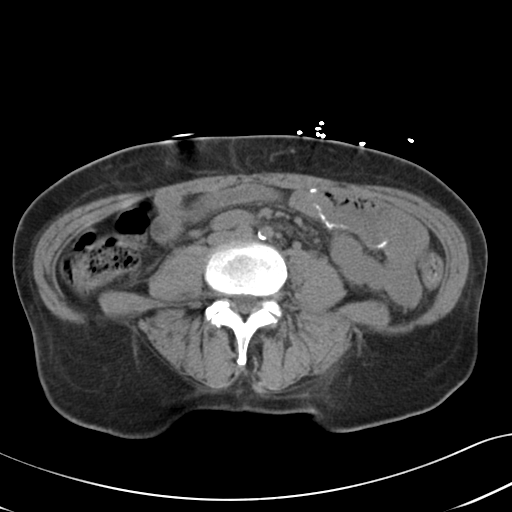
[im 51/88  soft-tissue]
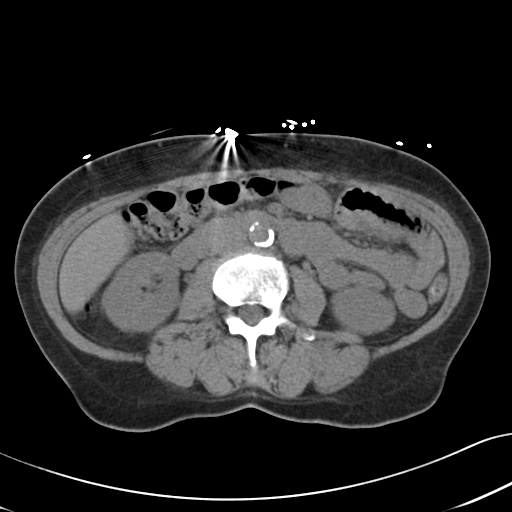
[im 59/88  soft-tissue]
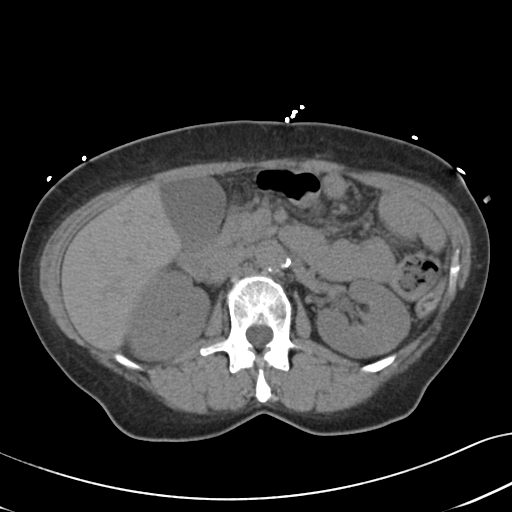
[im 59/88  bone]
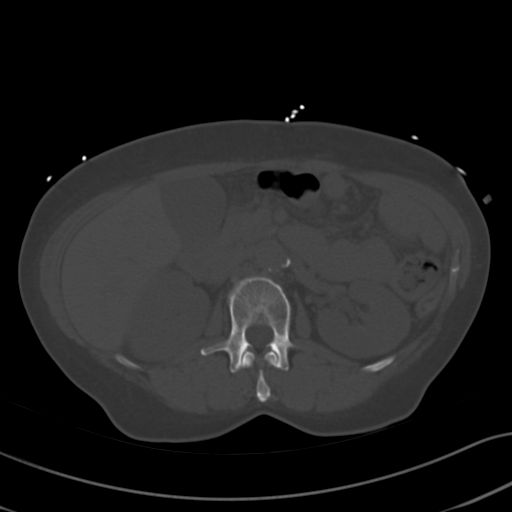
[im 62/88  soft-tissue]
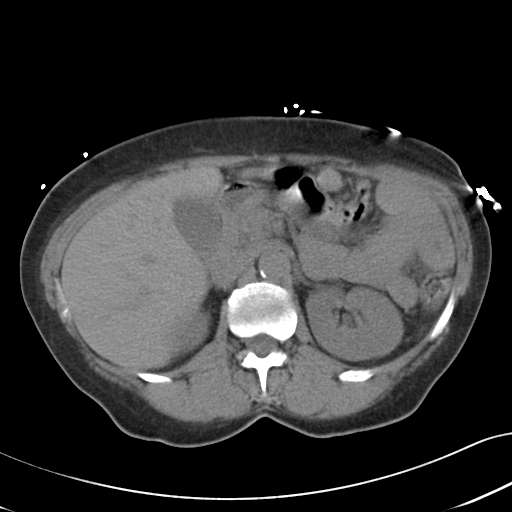
[im 69/88  soft-tissue]
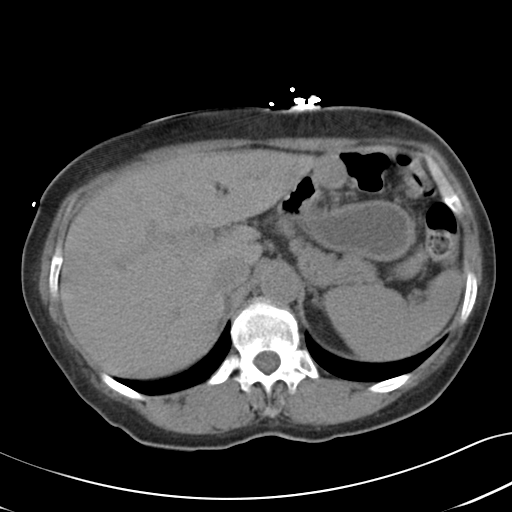
[im 73/88  lung]
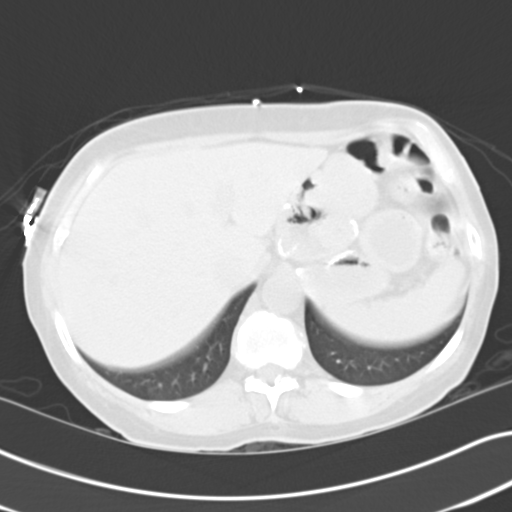
[im 77/88  soft-tissue]
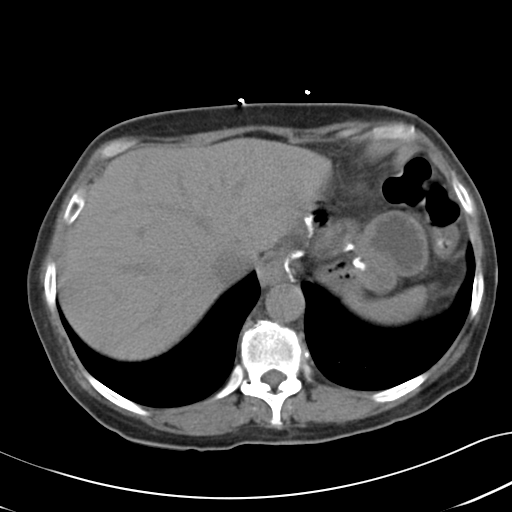
[im 77/88  lung]
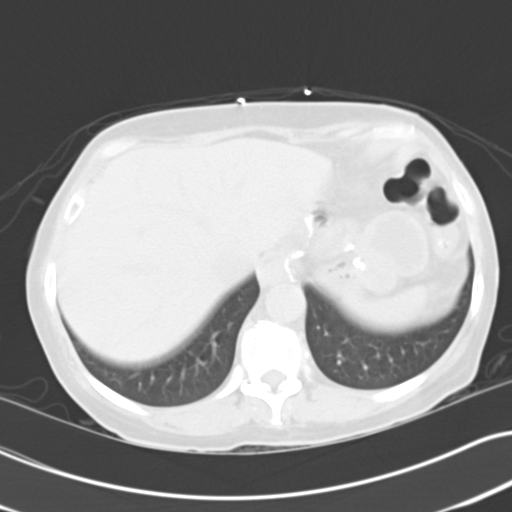
[im 80/88  lung]
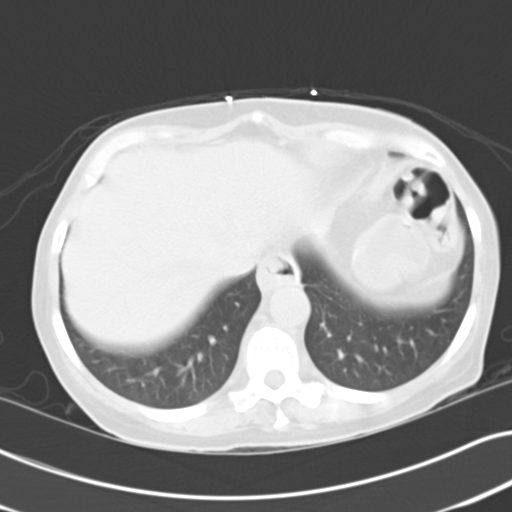
[im 84/88  soft-tissue]
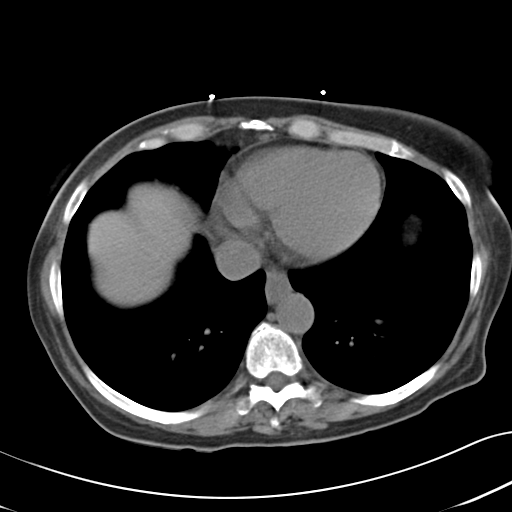
[im 84/88  lung]
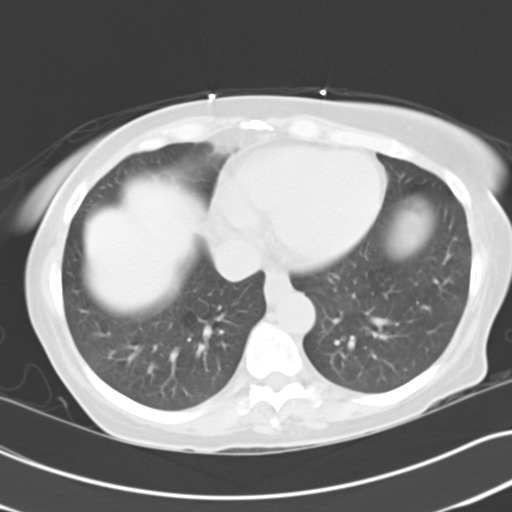

[15 of 32 positions shown; findings below may reference images not displayed]

FINDINGS: Lower chest:  Unremarkable

Hepatobiliary: Unremarkable

Pancreas: Unremarkable

Spleen: Unremarkable

Adrenals/Urinary Tract: Unremarkable

Stomach/Bowel: Postoperative findings from gastric bypass. Appendix
not well seen but no right lower quadrant inflammatory process
noted.

Prominent stool throughout the colon favors constipation.

Vascular/Lymphatic: Aortoiliac atherosclerotic vascular disease.

Reproductive: Uterus absent.  Ovaries not well seen.

Other: Small amount of free pelvic fluid.

Musculoskeletal: Unremarkable
IMPRESSION: 1. Prominent stool throughout the colon favors constipation. No
dilated bowel identified.
2. Postoperative findings from prior gastric bypass.
3.  Aortoiliac atherosclerotic vascular disease.
4. Small amount of free pelvic fluid, etiology uncertain.

## 2017-05-02 DIAGNOSIS — F5101 Primary insomnia: Secondary | ICD-10-CM | POA: Insufficient documentation

## 2017-05-02 DIAGNOSIS — M159 Polyosteoarthritis, unspecified: Secondary | ICD-10-CM | POA: Insufficient documentation

## 2017-05-02 DIAGNOSIS — J4520 Mild intermittent asthma, uncomplicated: Secondary | ICD-10-CM | POA: Diagnosis present

## 2017-05-02 DIAGNOSIS — F332 Major depressive disorder, recurrent severe without psychotic features: Secondary | ICD-10-CM | POA: Insufficient documentation

## 2017-05-02 DIAGNOSIS — F1120 Opioid dependence, uncomplicated: Secondary | ICD-10-CM | POA: Diagnosis present

## 2017-05-03 DIAGNOSIS — Z9884 Bariatric surgery status: Secondary | ICD-10-CM | POA: Insufficient documentation

## 2017-07-30 ENCOUNTER — Encounter: Payer: Self-pay | Admitting: Emergency Medicine

## 2017-07-30 ENCOUNTER — Emergency Department
Admission: EM | Admit: 2017-07-30 | Discharge: 2017-07-31 | Disposition: A | Discharge status: Home / Self Care | Payer: Self-pay | Attending: Emergency Medicine | Admitting: Emergency Medicine

## 2017-07-30 ENCOUNTER — Other Ambulatory Visit: Payer: Self-pay

## 2017-07-30 DIAGNOSIS — Z85828 Personal history of other malignant neoplasm of skin: Secondary | ICD-10-CM | POA: Insufficient documentation

## 2017-07-30 DIAGNOSIS — F10920 Alcohol use, unspecified with intoxication, uncomplicated: Secondary | ICD-10-CM | POA: Insufficient documentation

## 2017-07-30 DIAGNOSIS — J45909 Unspecified asthma, uncomplicated: Secondary | ICD-10-CM | POA: Insufficient documentation

## 2017-07-30 DIAGNOSIS — F17210 Nicotine dependence, cigarettes, uncomplicated: Secondary | ICD-10-CM | POA: Insufficient documentation

## 2017-07-30 DIAGNOSIS — G8929 Other chronic pain: Secondary | ICD-10-CM | POA: Insufficient documentation

## 2017-07-30 DIAGNOSIS — Z79899 Other long term (current) drug therapy: Secondary | ICD-10-CM | POA: Insufficient documentation

## 2017-07-30 DIAGNOSIS — M549 Dorsalgia, unspecified: Secondary | ICD-10-CM | POA: Insufficient documentation

## 2017-07-30 LAB — URINE DRUG SCREEN, QUALITATIVE (ARMC ONLY)
Amphetamines, Ur Screen: NOT DETECTED
BARBITURATES, UR SCREEN: NOT DETECTED
BENZODIAZEPINE, UR SCRN: NOT DETECTED
CANNABINOID 50 NG, UR ~~LOC~~: NOT DETECTED
Cocaine Metabolite,Ur ~~LOC~~: NOT DETECTED
MDMA (Ecstasy)Ur Screen: NOT DETECTED
METHADONE SCREEN, URINE: NOT DETECTED
OPIATE, UR SCREEN: NOT DETECTED
PHENCYCLIDINE (PCP) UR S: NOT DETECTED
Tricyclic, Ur Screen: NOT DETECTED

## 2017-07-30 LAB — CBC WITH DIFFERENTIAL/PLATELET
BASOS PCT: 2 %
Basophils Absolute: 0.1 10*3/uL (ref 0–0.1)
EOS PCT: 5 %
Eosinophils Absolute: 0.3 10*3/uL (ref 0–0.7)
HEMATOCRIT: 43.7 % (ref 35.0–47.0)
Hemoglobin: 14 g/dL (ref 12.0–16.0)
Lymphocytes Relative: 26 %
Lymphs Abs: 1.9 10*3/uL (ref 1.0–3.6)
MCH: 29.4 pg (ref 26.0–34.0)
MCHC: 32.1 g/dL (ref 32.0–36.0)
MCV: 91.6 fL (ref 80.0–100.0)
MONO ABS: 0.5 10*3/uL (ref 0.2–0.9)
MONOS PCT: 7 %
NEUTROS ABS: 4.4 10*3/uL (ref 1.4–6.5)
Neutrophils Relative %: 60 %
PLATELETS: 319 10*3/uL (ref 150–440)
RBC: 4.77 MIL/uL (ref 3.80–5.20)
RDW: 14.4 % (ref 11.5–14.5)
WBC: 7.2 10*3/uL (ref 3.6–11.0)

## 2017-07-30 LAB — COMPREHENSIVE METABOLIC PANEL
ALBUMIN: 4.1 g/dL (ref 3.5–5.0)
ALT: 34 U/L (ref 14–54)
ANION GAP: 7 (ref 5–15)
AST: 29 U/L (ref 15–41)
Alkaline Phosphatase: 77 U/L (ref 38–126)
BILIRUBIN TOTAL: 0.4 mg/dL (ref 0.3–1.2)
BUN: 12 mg/dL (ref 6–20)
CO2: 27 mmol/L (ref 22–32)
Calcium: 8.6 mg/dL — ABNORMAL LOW (ref 8.9–10.3)
Chloride: 108 mmol/L (ref 101–111)
Creatinine, Ser: 0.72 mg/dL (ref 0.44–1.00)
GFR calc non Af Amer: >60 mL/min (ref >60)
GLUCOSE: 102 mg/dL — AB (ref 65–99)
POTASSIUM: 3.8 mmol/L (ref 3.5–5.1)
Sodium: 142 mmol/L (ref 135–145)
TOTAL PROTEIN: 7.3 g/dL (ref 6.5–8.1)

## 2017-07-30 NOTE — ED Triage Notes (Signed)
Pt refusing to get blood work "I don't care I am drunk I don't want blood work" pt complaint with changing to hospital scrubs
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
## 2017-07-31 LAB — ETHANOL: Alcohol, Ethyl (B): 108 mg/dL — ABNORMAL HIGH (ref <10)
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
## 2017-07-31 NOTE — Discharge Instructions (Signed)
You have been seen in the emergency department for a  psychiatric concern. You have been evaluated both medically as well as psychiatrically. Please follow-up with your outpatient resources provided. Return to the emergency department for any worsening symptoms, or any thoughts of hurting yourself or anyone else so that we may attempt to help you. 

## 2017-07-31 NOTE — ED Notes (Signed)
BEHAVIORAL HEALTH ROUNDING  Patient sleeping: No.  Patient alert and oriented: yes  Behavior appropriate: Yes. ; If no, describe:  Nutrition and fluids offered: Yes  Toileting and hygiene offered: Yes  Sitter present: not applicable, Q 15 min safety rounds and observation.  Law enforcement present: Yes ODS  
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
## 2017-07-31 NOTE — ED Notes (Signed)

## 2017-09-27 DIAGNOSIS — M5416 Radiculopathy, lumbar region: Secondary | ICD-10-CM | POA: Insufficient documentation

## 2017-10-17 ENCOUNTER — Emergency Department
Admission: EM | Admit: 2017-10-17 | Discharge: 2017-10-17 | Disposition: A | Discharge status: Home / Self Care | Payer: Self-pay | Attending: Emergency Medicine | Admitting: Emergency Medicine

## 2017-10-17 ENCOUNTER — Emergency Department: Payer: Self-pay

## 2017-10-17 ENCOUNTER — Other Ambulatory Visit: Payer: Self-pay

## 2017-10-17 ENCOUNTER — Encounter: Payer: Self-pay | Admitting: Emergency Medicine

## 2017-10-17 DIAGNOSIS — G8929 Other chronic pain: Secondary | ICD-10-CM | POA: Diagnosis present

## 2017-10-17 DIAGNOSIS — T4271XA Poisoning by unspecified antiepileptic and sedative-hypnotic drugs, accidental (unintentional), initial encounter: Secondary | ICD-10-CM

## 2017-10-17 DIAGNOSIS — F17210 Nicotine dependence, cigarettes, uncomplicated: Secondary | ICD-10-CM | POA: Insufficient documentation

## 2017-10-17 DIAGNOSIS — T887XXA Unspecified adverse effect of drug or medicament, initial encounter: Secondary | ICD-10-CM | POA: Insufficient documentation

## 2017-10-17 DIAGNOSIS — Y69 Unspecified misadventure during surgical and medical care: Secondary | ICD-10-CM | POA: Insufficient documentation

## 2017-10-17 DIAGNOSIS — M549 Dorsalgia, unspecified: Secondary | ICD-10-CM

## 2017-10-17 DIAGNOSIS — R4182 Altered mental status, unspecified: Secondary | ICD-10-CM | POA: Insufficient documentation

## 2017-10-17 DIAGNOSIS — F4325 Adjustment disorder with mixed disturbance of emotions and conduct: Secondary | ICD-10-CM

## 2017-10-17 DIAGNOSIS — T50901A Poisoning by unspecified drugs, medicaments and biological substances, accidental (unintentional), initial encounter: Secondary | ICD-10-CM | POA: Insufficient documentation

## 2017-10-17 DIAGNOSIS — J45909 Unspecified asthma, uncomplicated: Secondary | ICD-10-CM | POA: Insufficient documentation

## 2017-10-17 LAB — COMPREHENSIVE METABOLIC PANEL
ALK PHOS: 77 U/L (ref 38–126)
ALT: 15 U/L (ref 14–54)
ANION GAP: 10 (ref 5–15)
AST: 15 U/L (ref 15–41)
Albumin: 3 g/dL — ABNORMAL LOW (ref 3.5–5.0)
BILIRUBIN TOTAL: 0.7 mg/dL (ref 0.3–1.2)
BUN: 14 mg/dL (ref 6–20)
CALCIUM: 8.6 mg/dL — AB (ref 8.9–10.3)
CO2: 21 mmol/L — ABNORMAL LOW (ref 22–32)
Chloride: 110 mmol/L (ref 101–111)
Creatinine, Ser: 0.82 mg/dL (ref 0.44–1.00)
GFR calc non Af Amer: >60 mL/min (ref >60)
Glucose, Bld: 112 mg/dL — ABNORMAL HIGH (ref 65–99)
Potassium: 3.6 mmol/L (ref 3.5–5.1)
Sodium: 141 mmol/L (ref 135–145)
Total Protein: 7.3 g/dL (ref 6.5–8.1)

## 2017-10-17 LAB — URINE DRUG SCREEN, QUALITATIVE (ARMC ONLY)
Amphetamines, Ur Screen: NOT DETECTED
BENZODIAZEPINE, UR SCRN: NOT DETECTED
Barbiturates, Ur Screen: NOT DETECTED
CANNABINOID 50 NG, UR ~~LOC~~: NOT DETECTED
Cocaine Metabolite,Ur ~~LOC~~: NOT DETECTED
MDMA (Ecstasy)Ur Screen: NOT DETECTED
METHADONE SCREEN, URINE: NOT DETECTED
OPIATE, UR SCREEN: NOT DETECTED
Phencyclidine (PCP) Ur S: NOT DETECTED
Tricyclic, Ur Screen: NOT DETECTED

## 2017-10-17 LAB — ETHANOL: Alcohol, Ethyl (B): <10 mg/dL (ref <10)

## 2017-10-17 LAB — URINALYSIS, COMPLETE (UACMP) WITH MICROSCOPIC
Bacteria, UA: NONE SEEN
Bilirubin Urine: NEGATIVE
GLUCOSE, UA: NEGATIVE mg/dL
Hgb urine dipstick: NEGATIVE
KETONES UR: NEGATIVE mg/dL
LEUKOCYTES UA: NEGATIVE
Nitrite: NEGATIVE
PH: 6 (ref 5.0–8.0)
Protein, ur: NEGATIVE mg/dL
SPECIFIC GRAVITY, URINE: 1.013 (ref 1.005–1.030)
SQUAMOUS EPITHELIAL / LPF: NONE SEEN

## 2017-10-17 LAB — CBC
HEMATOCRIT: 38.4 % (ref 35.0–47.0)
Hemoglobin: 12.5 g/dL (ref 12.0–16.0)
MCH: 28.2 pg (ref 26.0–34.0)
MCHC: 32.6 g/dL (ref 32.0–36.0)
MCV: 86.6 fL (ref 80.0–100.0)
Platelets: 507 10*3/uL — ABNORMAL HIGH (ref 150–440)
RBC: 4.43 MIL/uL (ref 3.80–5.20)
RDW: 15.1 % — ABNORMAL HIGH (ref 11.5–14.5)
WBC: 15.1 10*3/uL — ABNORMAL HIGH (ref 3.6–11.0)

## 2017-10-17 LAB — SALICYLATE LEVEL

## 2017-10-17 LAB — ACETAMINOPHEN LEVEL

## 2017-10-17 MED ORDER — ACETAMINOPHEN 325 MG PO TABS
650.0000 mg | ORAL_TABLET | Freq: Once | ORAL | Status: AC
  Administered 2017-10-17: 650 mg via ORAL
  Filled 2017-10-17: qty 2

## 2017-10-17 MED ORDER — IBUPROFEN 600 MG PO TABS: 600.0000 mg | ORAL_TABLET | Freq: Once | ORAL | Status: DC

## 2017-10-17 MED ORDER — LORAZEPAM 2 MG/ML IJ SOLN
1.0000 mg | Freq: Once | INTRAMUSCULAR | Status: AC
  Administered 2017-10-17: 1 mg via INTRAVENOUS
  Filled 2017-10-17: qty 1
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
## 2017-10-17 NOTE — ED Notes (Signed)
ENVIRONMENTAL ASSESSMENT  Potentially harmful objects out of patient reach: Yes.  Personal belongings secured: Yes.  Patient dressed in hospital provided attire only: Yes.  Plastic bags out of patient reach: Yes.  Patient care equipment (cords, cables, call bells, lines, and drains) shortened, removed, or accounted for: Yes.  Equipment and supplies removed from bottom of stretcher: Yes.  Potentially toxic materials out of patient reach: Yes.  Sharps container removed or out of patient reach: Yes.   BEHAVIORAL HEALTH ROUNDING  Patient sleeping: No.  Patient alert and oriented: yes  Behavior appropriate: Yes. ; If no, describe:  Nutrition and fluids offered: Yes  Toileting and hygiene offered: Yes  Sitter present: not applicable, Q 15 min safety rounds and observation.  Law enforcement present: Yes ODS  Pt ready for discharge. Pt given phone to call her husband to pick her up.

## 2017-10-17 NOTE — Consult Note (Signed)
[facility] Consult   Reason for Consult: Consult for 57 year old woman brought into the emergency room with altered mental status Referring Physician: Jimmye Norman Patient Identification: Tara Craig MRN:  [id number] Principal Diagnosis: Sedative overdose Diagnosis:   Patient Active Problem List   Diagnosis Date Noted  . Sedative overdose [T42.71XA] 10/17/2017  . Adjustment disorder with mixed disturbance of emotions and conduct [F43.25] 10/17/2017  . Back pain, chronic [M54.9, G89.29] 05/31/2012  . Endometriosis [N80.9] 05/31/2012  . Acid reflux [K21.9] 05/31/2012  . HLD (hyperlipidemia) [E78.5] 05/31/2012  . BP (high blood pressure) [I10] 05/31/2012  . Current smoker [F17.200] 05/31/2012  . SCC (squamous cell carcinoma), face [C44.320] 05/31/2012    Total Time spent with patient: 1 hour  Subjective:   Tara Craig is a 56 y.o. female patient admitted with "I go to the pain clinic" patient interviewed chart reviewed this is a 57 year old woman who came into the emergency room when her husband called 911 last night.Marland Kitchen  HPI: Husband gives the history that yesterday afternoon he and the patient were running some errands and she seemed to be in her normal condition.  He thought she was taking her regular medicine and was away from her for a short period of time when he came back she appeared to be intoxicated by his description.  She was nodding off with her head falling down on a table.  Later on she seemed to be staggering and falling against a wall.  Apparently later in the night she got up and actually fell in the bathroom at which point he called 911.  Patient has been quiet and withdrawn here in the emergency room but did communicate with me.  She tells me that she took pills yesterday more than what she normally takes.  She says that she took some "muscle relaxer" that she got from somebody that is not prescribed for her.  She says she did this because she wanted some  relief from her pain.  Patient denies that there was any suicidal thought or intent to it.  Patient does not describe any recent change to her mood.  No evidence psychosis.  She says she has been drinking more alcohol recently than previously but cannot quantify how much yesterday and does not appear to have had any alcohol in her system when she presented.  Patient indicates that she has a stressful life at home.  She has chronic pain issues that she goes to the pain clinic for and gets narcotic pain medicines.  Social history: Does not work outside the home.  Lives with her husband plus what sounds like at least 2 other couples and children all in the same home.  Medical history: Chronic pain issues hyperlipidemia high blood pressure history of squamous cell cancer.  Substance abuse history: Note evidence reported in the chart of treatment for substance abuse problems in the past.  Patient does say she has been drinking recently no evidence of drugs or alcohol in the lab screening.  Past Psychiatric History: Patient does have past psychiatric admissions but there is nothing in our records or in the care everywhere records of any admission.  Denies that she has had any suicidal ideation any time recently or ever tried to kill herself in the past.  Chart indicates that she takes Cymbalta prescribed by Dr. Randel Books.  Risk to Self: Suicidal Ideation: (UTA) Suicidal Intent: (UTA) Is patient at risk for suicide?: (UTA) Suicidal Plan?: (UTA) Access to Means: (UTA) What has been  your use of drugs/alcohol within the last 12 months?: Reports of none How many times?: 0 Other Self Harm Risks: Reports of none Triggers for Past Attempts: None known Intentional Self Injurious Behavior: None Risk to Others: Homicidal Ideation: (UTA) Thoughts of Harm to Others: (UTA) Current Homicidal Intent: (UTA) Current Homicidal Plan: (UTA) Access to Homicidal Means: (UTA) Identified Victim: UTA History of harm to  others?: No(per husband) Assessment of Violence: None Noted Violent Behavior Description: Reports of none, per husband Does patient have access to weapons?: No Criminal Charges Pending?: No Does patient have a court date: No Prior Inpatient Therapy: Prior Inpatient Therapy: No Prior Outpatient Therapy: Prior Outpatient Therapy: Yes Prior Therapy Dates: Currently Prior Therapy Facilty/Provider(s): [facility] Reason for Treatment: Depression Does patient have an ACCT team?: No Does patient have Intensive In-House Services?  : No Does patient have [facility] services? : No Does patient have P4CC services?: No  Past Medical History:  Past Medical History:  Diagnosis Date  . Arthritis   . Asthma   . Back pain, chronic   . Bilateral foot-drop   . Skin cancer     Past Surgical History:  Procedure Laterality Date  . ABDOMINAL HYSTERECTOMY    . APPENDECTOMY    . BACK SURGERY  2010  . TENNIS ELBOW RELEASE/NIRSCHEL PROCEDURE     Family History:  Family History  Problem Relation Age of Onset  . Cancer Mother        lung  . Heart disease Father    Family Psychiatric  History: Does not know of any Social History:  Social History   Substance and Sexual Activity  Alcohol Use No     Social History   Substance and Sexual Activity  Drug Use No    Social History   Socioeconomic History  . Marital status: Married    Spouse name: None  . Number of children: None  . Years of education: None  . Highest education level: None  Social Needs  . Financial resource strain: None  . Food insecurity - worry: None  . Food insecurity - inability: None  . Transportation needs - medical: None  . Transportation needs - non-medical: None  Occupational History  . None  Tobacco Use  . Smoking status: Current Every Day Smoker    Packs/day: 1.50    Types: Cigarettes  . Smokeless tobacco: Never Used  Substance and Sexual Activity  . Alcohol use: No  . Drug use: No  . Sexual activity: None   Other Topics Concern  . None  Social History Narrative  . None   Additional Social History:    Allergies:   Allergies  Allergen Reactions  . Tramadol Nausea And Vomiting    Labs:  Results for orders placed or performed during the hospital encounter of 10/17/17 (from the past 48 hour(s))  Comprehensive metabolic panel     Status: Abnormal   Collection Time: 10/17/17  8:46 AM  Result Value Ref Range   Sodium 141 135 - 145 mmol/L   Potassium 3.6 3.5 - 5.1 mmol/L   Chloride 110 101 - 111 mmol/L   CO2 21 (L) 22 - 32 mmol/L   Glucose, Bld 112 (H) 65 - 99 mg/dL   BUN 14 6 - 20 mg/dL   Creatinine, Ser 0.82 0.44 - 1.00 mg/dL   Calcium 8.6 (L) 8.9 - 10.3 mg/dL   Total Protein 7.3 6.5 - 8.1 g/dL   Albumin 3.0 (L) 3.5 - 5.0 g/dL   AST 15 15 -  41 U/L   ALT 15 14 - 54 U/L   Alkaline Phosphatase 77 38 - 126 U/L   Total Bilirubin 0.7 0.3 - 1.2 mg/dL   GFR calc non Af Amer >60 >60 mL/min   GFR calc Af Amer >60 >60 mL/min    Comment: (NOTE) The eGFR has been calculated using the CKD EPI equation. This calculation has not been validated in all clinical situations. eGFR's persistently <60 mL/min signify possible Chronic Kidney Disease.    Anion gap 10 5 - 15    Comment: Performed at [facility], [address] [place], [place] [address]  Ethanol     Status: None   Collection Time: 10/17/17  8:46 AM  Result Value Ref Range   Alcohol, Ethyl (B) <10 <10 mg/dL    Comment:        LOWEST DETECTABLE LIMIT FOR SERUM ALCOHOL IS 10 mg/dL FOR MEDICAL PURPOSES ONLY Performed at [facility], [address], [place], [place] [address]   Salicylate level     Status: None   Collection Time: 10/17/17  8:46 AM  Result Value Ref Range   Salicylate Lvl <5.6 2.8 - 30.0 mg/dL    Comment: Performed at [facility], [address] [place], [place] [address]  Acetaminophen level     Status: Abnormal   Collection Time: 10/17/17  8:46 AM  Result Value Ref Range    Acetaminophen (Tylenol), Serum <10 (L) 10 - 30 ug/mL    Comment:        THERAPEUTIC CONCENTRATIONS VARY SIGNIFICANTLY. A RANGE OF 10-30 ug/mL MAY BE AN EFFECTIVE CONCENTRATION FOR MANY PATIENTS. HOWEVER, SOME ARE BEST TREATED AT CONCENTRATIONS OUTSIDE THIS RANGE. ACETAMINOPHEN CONCENTRATIONS >150 ug/mL AT 4 HOURS AFTER INGESTION AND >50 ug/mL AT 12 HOURS AFTER INGESTION ARE OFTEN ASSOCIATED WITH TOXIC REACTIONS. Performed at [facility], [address] [place], [place] [address]   cbc     Status: Abnormal   Collection Time: 10/17/17  8:46 AM  Result Value Ref Range   WBC 15.1 (H) 3.6 - 11.0 K/uL   RBC 4.43 3.80 - 5.20 MIL/uL   Hemoglobin 12.5 12.0 - 16.0 g/dL   HCT 38.4 35.0 - 47.0 %   MCV 86.6 80.0 - 100.0 fL   MCH 28.2 26.0 - 34.0 pg   MCHC 32.6 32.0 - 36.0 g/dL   RDW 15.1 (H) 11.5 - 14.5 %   Platelets 507 (H) 150 - 440 K/uL    Comment: Performed at [facility], [address] [place],  [address]  Urine Drug Screen, Qualitative     Status: None   Collection Time: 10/17/17 10:38 AM  Result Value Ref Range   Tricyclic, Ur Screen NONE DETECTED NONE DETECTED   Amphetamines, Ur Screen NONE DETECTED NONE DETECTED   MDMA (Ecstasy)Ur Screen NONE DETECTED NONE DETECTED   Cocaine Metabolite,Ur [place] NONE DETECTED NONE DETECTED   Opiate, Ur Screen NONE DETECTED NONE DETECTED   Phencyclidine (PCP) Ur S NONE DETECTED NONE DETECTED   Cannabinoid 50 Ng, Ur [place] NONE DETECTED NONE DETECTED   Barbiturates, Ur Screen NONE DETECTED NONE DETECTED   Benzodiazepine, Ur Scrn NONE DETECTED NONE DETECTED   Methadone Scn, Ur NONE DETECTED NONE DETECTED    Comment: (NOTE) Tricyclics + metabolites, urine    Cutoff 1000 ng/mL Amphetamines + metabolites, urine  Cutoff 1000 ng/mL MDMA (Ecstasy), urine              Cutoff 500 ng/mL Cocaine Metabolite, urine  Cutoff 300 ng/mL Opiate + metabolites, urine        Cutoff 300 ng/mL Phencyclidine (PCP), urine          Cutoff 25 ng/mL Cannabinoid, urine                 Cutoff 50 ng/mL Barbiturates + metabolites, urine  Cutoff 200 ng/mL Benzodiazepine, urine              Cutoff 200 ng/mL Methadone, urine                   Cutoff 300 ng/mL The urine drug screen provides only a preliminary, unconfirmed analytical test result and should not be used for non-medical purposes. Clinical consideration and professional judgment should be applied to any positive drug screen result due to possible interfering substances. A more specific alternate chemical method must be used in order to obtain a confirmed analytical result. Gas chromatography / mass spectrometry (GC/MS) is the preferred confirmat ory method. Performed at [facility], [address] [place], [place] [address]   Urinalysis, Complete w Microscopic     Status: Abnormal   Collection Time: 10/17/17 10:38 AM  Result Value Ref Range   Color, Urine YELLOW (A) YELLOW   APPearance CLEAR (A) CLEAR   Specific Gravity, Urine 1.013 1.005 - 1.030   pH 6.0 5.0 - 8.0   Glucose, UA NEGATIVE NEGATIVE mg/dL   Hgb urine dipstick NEGATIVE NEGATIVE   Bilirubin Urine NEGATIVE NEGATIVE   Ketones, ur NEGATIVE NEGATIVE mg/dL   Protein, ur NEGATIVE NEGATIVE mg/dL   Nitrite NEGATIVE NEGATIVE   Leukocytes, UA NEGATIVE NEGATIVE   RBC / HPF 0-5 0 - 5 RBC/hpf   WBC, UA 0-5 0 - 5 WBC/hpf   Bacteria, UA NONE SEEN NONE SEEN   Squamous Epithelial / LPF NONE SEEN NONE SEEN    Comment: Performed at [facility], [address] [place], [place] [address]    No current facility-administered medications for this encounter.    Current Outpatient Medications  Medication Sig Dispense Refill  . busPIRone (BUSPAR) 15 MG tablet Take 15 mg by mouth 3 (three) times daily.    . cyanocobalamin 1000 MCG tablet Take 1,000 mcg by mouth daily.    . diclofenac sodium (VOLTAREN) 1 % GEL Apply topically as directed.     . DULoxetine (CYMBALTA) 30 MG capsule Take 30 mg  by mouth daily.    Marland Kitchen gabapentin (NEURONTIN) 300 MG capsule Take 900 mg by mouth 3 (three) times daily.    . mirtazapine (REMERON) 30 MG tablet Take 30 mg by mouth at bedtime.    Marland Kitchen omeprazole (PRILOSEC) 40 MG capsule Take 40 mg by mouth 2 (two) times daily.    Marland Kitchen topiramate (TOPAMAX) 50 MG tablet Take 1 tablets ('75MG'$ ) by mouth every morning and 1 tablet ('50MG'$ ) by mouth every evening      Musculoskeletal: Strength & Muscle Tone: decreased Gait & Station: unable to stand Patient leans: N/A  Psychiatric Specialty Exam: Physical Exam  Nursing note and vitals reviewed. Constitutional: She appears well-developed and well-nourished.  HENT:  Head: Normocephalic and atraumatic.  Eyes: Conjunctivae are normal. Pupils are equal, round, and reactive to light.  Neck: Normal range of motion.  Cardiovascular: Regular rhythm and normal heart sounds.  Respiratory: Effort normal. No respiratory distress.  GI: Soft. She exhibits no distension.  Musculoskeletal: Normal range of motion.  Neurological: She is alert.  Skin: Skin is warm and dry.  Psychiatric: Her affect  is blunt. Her speech is delayed. She is slowed and withdrawn. Thought content is not paranoid. Cognition and memory are impaired. She expresses impulsivity. She expresses no homicidal and no suicidal ideation.    Review of Systems  Constitutional: Negative.   HENT: Negative.   Eyes: Negative.   Respiratory: Negative.   Cardiovascular: Negative.   Gastrointestinal: Negative.   Musculoskeletal: Positive for back pain and joint pain.  Skin: Negative.   Neurological: Negative.   Psychiatric/Behavioral: Positive for memory loss and substance abuse. Negative for depression, hallucinations and suicidal ideas. The patient is not nervous/anxious and does not have insomnia.     Blood pressure (!) 159/93, pulse 80, temperature 98.7 F (37.1 C), temperature source Oral, resp. rate 15, height '5\' 3"'$  (1.6 m), weight 52.2 kg (115 lb), SpO2 99 %.Body  mass index is 20.37 kg/m.  General Appearance: Disheveled  Eye Contact:  Fair  Speech:  Slow  Volume:  Decreased  Mood:  Dysphoric  Affect:  Constricted  Thought Process:  Goal Directed  Orientation:  Full (Time, Place, and Person)  Thought Content:  Logical  Suicidal Thoughts:  No  Homicidal Thoughts:  No  Memory:  Immediate;   Fair Recent;   Fair Remote;   Fair  Judgement:  Fair  Insight:  Fair  Psychomotor Activity:  Decreased  Concentration:  Concentration: Fair  Recall:  [employer] of Knowledge:  Fair  Language:  Fair  Akathisia:  No  Handed:  Right  AIMS (if indicated):     Assets:  Desire for Improvement Housing Resilience Social Support  ADL's:  Intact  Cognition:  Impaired,  Mild  Sleep:        Treatment Plan Summary: Medication management and Plan 57 year old woman with a history of anxiety and depression and appears to have taken an overdose of muscle relaxers but there is no evidence that she was trying to kill herself.  Patient does not meet commitment criteria.  Does not appear to be acutely dangerous.  Patient educated about the dangers of miss use of muscle relaxers and how they can have the same overdose effects of other sedatives.  Case reviewed with emergency room physician.  I suggest patient can be discharged home once she is medically stabilized.  She can follow-up with her usual outpatient provider.  No new prescriptions required.  Disposition: No evidence of imminent risk to self or others at present.   Patient does not meet criteria for psychiatric inpatient admission. Supportive therapy provided about ongoing stressors.  Alethia Berthold, MD 10/17/2017 3:13 PM

## 2017-10-17 NOTE — ED Notes (Signed)
Officer Teague returned receipt and key for pt's jewelry. Key #9 placed in pyxis under "key"

## 2017-10-17 NOTE — ED Notes (Signed)
Pt's jewelry given to Officer Teague to secure.

## 2017-10-17 NOTE — ED Provider Notes (Signed)
[facility] Emergency Department Provider Note       Time seen: ----------------------------------------- 9:14 AM on 10/17/2017 -----------------------------------------   I have reviewed the triage vital signs and the nursing notes.  HISTORY   Chief Complaint Suicidal    HPI Tara Craig is a 57 y.o. female with a history of arthritis, asthma, chronic back pain, GERD who presents to the ED for suicidal ideation and behavioral issues.  Patient's husband called EMS due to her being her head on the table and expressing suicidal ideation all night.  Patient reports to taking 2 pills of someone's muscle relaxant at some point prior to arrival.  She denies overdosing on any other substance.  Past Medical History:  Diagnosis Date  . Arthritis   . Asthma   . Back pain, chronic   . Bilateral foot-drop   . Skin cancer     Patient Active Problem List   Diagnosis Date Noted  . Back pain, chronic 05/31/2012  . Endometriosis 05/31/2012  . Acid reflux 05/31/2012  . HLD (hyperlipidemia) 05/31/2012  . BP (high blood pressure) 05/31/2012  . Current smoker 05/31/2012  . SCC (squamous cell carcinoma), face 05/31/2012    Past Surgical History:  Procedure Laterality Date  . ABDOMINAL HYSTERECTOMY    . APPENDECTOMY    . BACK SURGERY  2010  . TENNIS ELBOW RELEASE/NIRSCHEL PROCEDURE      Allergies Tramadol  Social History Social History   Tobacco Use  . Smoking status: Current Every Day Smoker    Packs/day: 1.50    Types: Cigarettes  . Smokeless tobacco: Never Used  Substance Use Topics  . Alcohol use: No  . Drug use: No   Review of Systems Constitutional: Negative for fever. Cardiovascular: Negative for chest pain. Respiratory: Negative for shortness of breath. Gastrointestinal: Negative for abdominal pain, vomiting and diarrhea. Musculoskeletal: Negative for back pain. Skin: Positive for scalp contusion Neurological: Negative for headaches,  focal weakness or numbness. Psychiatric: Positive for suicidal ideation, possible overdose  All systems negative/normal/unremarkable except as stated in the HPI  ____________________________________________   PHYSICAL EXAM:  VITAL SIGNS: ED Triage Vitals  Enc Vitals Group     BP 10/17/17 0852 (!) 176/114     Pulse Rate 10/17/17 0852 95     Resp 10/17/17 0852 16     Temp 10/17/17 0852 98.9 F (37.2 C)     Temp Source 10/17/17 0852 Oral     SpO2 10/17/17 0847 99 %     Weight 10/17/17 0853 115 lb (52.2 kg)     Height 10/17/17 0853 5\' 3"  (1.6 m)     Head Circumference --      Peak Flow --      Pain Score --      Pain Loc --      Pain Edu? --      Excl. in [place]? --    Constitutional: Alert and oriented.  Tearful, mild distress Eyes: Conjunctivae are normal. Normal extraocular movements. ENT   Head: Normocephalic, left frontal scalp hematoma   Nose: No congestion/rhinnorhea.   Mouth/Throat: Mucous membranes are moist.   Neck: No stridor. Cardiovascular: Normal rate, regular rhythm. No murmurs, rubs, or gallops. Respiratory: Normal respiratory effort without tachypnea nor retractions. Breath sounds are clear and equal bilaterally. No wheezes/rales/rhonchi. Gastrointestinal: Soft and nontender. Normal bowel sounds Musculoskeletal: Nontender with normal range of motion in extremities. No lower extremity tenderness nor edema. Neurologic:  Normal speech and language. No gross focal neurologic deficits are  appreciated.  Skin:  Skin is warm, dry and intact. No rash noted. Psychiatric: Tearful, depressed mood and affect ____________________________________________  ED COURSE:  As part of my medical decision making, I reviewed the following data within the [place] History obtained from family if available, nursing notes, old chart and ekg, as well as notes from prior ED visits. Patient presented for suicidal ideation and possible overdose, we will assess  with labs and imaging as indicated at this time.   Procedures ____________________________________________   LABS (pertinent positives/negatives)  Labs Reviewed  COMPREHENSIVE METABOLIC PANEL - Abnormal; Notable for the following components:      Result Value   CO2 21 (*)    Glucose, Bld 112 (*)    Calcium 8.6 (*)    Albumin 3.0 (*)    All other components within normal limits  ACETAMINOPHEN LEVEL - Abnormal; Notable for the following components:   Acetaminophen (Tylenol), Serum <10 (*)    All other components within normal limits  CBC - Abnormal; Notable for the following components:   WBC 15.1 (*)    RDW 15.1 (*)    Platelets 507 (*)    All other components within normal limits  URINALYSIS, COMPLETE (UACMP) WITH MICROSCOPIC - Abnormal; Notable for the following components:   Color, Urine YELLOW (*)    APPearance CLEAR (*)    All other components within normal limits  ETHANOL  SALICYLATE LEVEL  URINE DRUG SCREEN, QUALITATIVE ([facility] ONLY)    RADIOLOGY Images were viewed by me  CT head   IMPRESSION: Bilateral maxillary mucous retention cysts. No acute intracranial abnormality seen. ____________________________________________  DIFFERENTIAL DIAGNOSIS   Overdose, depression, suicidal ideation, dehydration, subdural hematoma  FINAL ASSESSMENT AND PLAN  Overdose, depression   Plan: The patient had presented for suicidal ideation and behavioral issues and possible overdose. Patient's labs did not reveal any specific etiology. Patient's imaging was negative.  Patient is medically stable for psychiatric evaluation   Laurence Aly, MD   Note: This note was generated in part or whole with voice recognition software. Voice recognition is usually quite accurate but there are transcription errors that can and very often do occur. I apologize for any typographical errors that were not detected and corrected.     Earleen Newport, MD 10/17/17 [phone]

## 2017-10-17 NOTE — ED Notes (Signed)
Pt  Vol  Seen  By  Dr  Weber Cooks

## 2017-10-17 NOTE — ED Notes (Signed)
Husband here and brought pt clean clothes. Pt given clothes to dress.

## 2017-10-17 NOTE — ED Notes (Signed)
Walked patient to bathroom,patient had urine for output.

## 2017-10-17 NOTE — ED Notes (Signed)
Called lab re: adding UA

## 2017-10-17 NOTE — Discharge Instructions (Signed)
Return to the emergency department for any new or worsening symptoms including weakness, lightheadedness, or any mental health symptoms such as feeling like you want to hurt yourself or anyone else.

## 2017-10-17 NOTE — ED Provider Notes (Signed)
-----------------------------------------   4:13 PM on 10/17/2017 -----------------------------------------  I took over care of this patient from Dr. Jimmye Norman.  Per Dr. Jimmye Norman, the patient was medically cleared but awaiting evaluation by psychiatry.  Dr. Weber Cooks from psychiatry evaluated the patient and has cleared her for discharge from a psychiatric perspective.  On reassessment, the patient is awake, but she still appears somewhat sleepy and per RN urinated on the floor.  She is significantly more awake than when she arrived in the ED as the medication wears off.  Based on the results of the workup and recommendations psychiatry, once she is more awake she will be appropriate for discharge home.  We will continue to observe and plan for discharge when she is adequately awake.    ----------------------------------------- 6:56 PM on 10/17/2017 -----------------------------------------  The patient is now fully awake, ate a meal, and appears more alert.  She states she would like to go home, and at this time she appears safe to do so.  Return precautions given and the patient expressed understanding.    Arta Silence, MD 10/17/17 1857

## 2017-10-17 NOTE — ED Notes (Signed)
Pt has started to state that she believes her sister in law is in the area, stating that she can hear her and accusing this nurse of being a bad liar - smiling all the while. She also believes that her husband was in the area and that he left "a few minutes ago." Pt was assured that her husband left hours ago, and she responded, smiling, that "you are not a good liar."

## 2018-10-13 ENCOUNTER — Emergency Department
Admission: EM | Admit: 2018-10-13 | Discharge: 2018-10-14 | Disposition: A | Discharge status: Home / Self Care | Payer: Self-pay | Attending: Emergency Medicine | Admitting: Emergency Medicine

## 2018-10-13 ENCOUNTER — Emergency Department: Payer: Self-pay

## 2018-10-13 ENCOUNTER — Encounter: Payer: Self-pay | Admitting: Emergency Medicine

## 2018-10-13 ENCOUNTER — Other Ambulatory Visit: Payer: Self-pay

## 2018-10-13 DIAGNOSIS — M545 Low back pain, unspecified: Secondary | ICD-10-CM

## 2018-10-13 DIAGNOSIS — F17210 Nicotine dependence, cigarettes, uncomplicated: Secondary | ICD-10-CM | POA: Insufficient documentation

## 2018-10-13 DIAGNOSIS — R51 Headache: Secondary | ICD-10-CM | POA: Insufficient documentation

## 2018-10-13 DIAGNOSIS — W010XXA Fall on same level from slipping, tripping and stumbling without subsequent striking against object, initial encounter: Secondary | ICD-10-CM | POA: Insufficient documentation

## 2018-10-13 DIAGNOSIS — J45909 Unspecified asthma, uncomplicated: Secondary | ICD-10-CM | POA: Insufficient documentation

## 2018-10-13 DIAGNOSIS — W19XXXA Unspecified fall, initial encounter: Secondary | ICD-10-CM

## 2018-10-13 DIAGNOSIS — Z85828 Personal history of other malignant neoplasm of skin: Secondary | ICD-10-CM | POA: Insufficient documentation

## 2018-10-13 LAB — COMPREHENSIVE METABOLIC PANEL
ALK PHOS: 78 U/L (ref 38–126)
ALT: 19 U/L (ref 0–44)
AST: 32 U/L (ref 15–41)
Albumin: 3.8 g/dL (ref 3.5–5.0)
Anion gap: 9 (ref 5–15)
BILIRUBIN TOTAL: 0.5 mg/dL (ref 0.3–1.2)
BUN: 11 mg/dL (ref 6–20)
CO2: 26 mmol/L (ref 22–32)
Calcium: 8.9 mg/dL (ref 8.9–10.3)
Chloride: 111 mmol/L (ref 98–111)
Creatinine, Ser: 0.62 mg/dL (ref 0.44–1.00)
GFR calc Af Amer: >60 mL/min (ref >60)
GFR calc non Af Amer: >60 mL/min (ref >60)
Glucose, Bld: 78 mg/dL (ref 70–99)
Potassium: 4 mmol/L (ref 3.5–5.1)
Sodium: 146 mmol/L — ABNORMAL HIGH (ref 135–145)
TOTAL PROTEIN: 6.9 g/dL (ref 6.5–8.1)

## 2018-10-13 LAB — CBC WITH DIFFERENTIAL/PLATELET
Abs Immature Granulocytes: 0.02 10*3/uL (ref 0.00–0.07)
Basophils Absolute: 0.1 10*3/uL (ref 0.0–0.1)
Basophils Relative: 1 %
Eosinophils Absolute: 0.2 10*3/uL (ref 0.0–0.5)
Eosinophils Relative: 2 %
HEMATOCRIT: 44.5 % (ref 36.0–46.0)
Hemoglobin: 14.4 g/dL (ref 12.0–15.0)
Immature Granulocytes: 0 %
LYMPHS ABS: 2.3 10*3/uL (ref 0.7–4.0)
Lymphocytes Relative: 24 %
MCH: 30 pg (ref 26.0–34.0)
MCHC: 32.4 g/dL (ref 30.0–36.0)
MCV: 92.7 fL (ref 80.0–100.0)
Monocytes Absolute: 0.7 10*3/uL (ref 0.1–1.0)
Monocytes Relative: 7 %
Neutro Abs: 6.5 10*3/uL (ref 1.7–7.7)
Neutrophils Relative %: 66 %
Platelets: 289 10*3/uL (ref 150–400)
RBC: 4.8 MIL/uL (ref 3.87–5.11)
RDW: 13.6 % (ref 11.5–15.5)
WBC: 9.8 10*3/uL (ref 4.0–10.5)
nRBC: 0 % (ref 0.0–0.2)

## 2018-10-13 LAB — TROPONIN I: Troponin I: <0.03 ng/mL (ref <0.03)

## 2018-10-13 LAB — ETHANOL: Alcohol, Ethyl (B): <10 mg/dL (ref <10)

## 2018-10-13 MED ORDER — DIAZEPAM 5 MG PO TABS: 5.0000 mg | ORAL_TABLET | Freq: Three times a day (TID) | ORAL | 0 refills | Status: DC | PRN

## 2018-10-13 MED ORDER — HYDROMORPHONE HCL 1 MG/ML IJ SOLN
1.0000 mg | Freq: Once | INTRAMUSCULAR | Status: AC
  Administered 2018-10-13: 1 mg via INTRAVENOUS
  Filled 2018-10-13: qty 1

## 2018-10-13 MED ORDER — OXYCODONE-ACETAMINOPHEN 5-325 MG PO TABS: 1.0000 | ORAL_TABLET | Freq: Three times a day (TID) | ORAL | 0 refills | Status: DC | PRN

## 2018-10-13 MED ORDER — ONDANSETRON HCL 4 MG/2ML IJ SOLN
4.0000 mg | Freq: Once | INTRAMUSCULAR | Status: AC
  Administered 2018-10-13: 4 mg via INTRAVENOUS
  Filled 2018-10-13: qty 2

## 2018-10-13 NOTE — ED Triage Notes (Addendum)
Patient presents to Emergency Department via [facility] from [employer] with complaints of back pain after falling in water in restaurant near game area.  Pt reports no LOC but did strike head on floor.  EMS gave 100 mcg Fentanyl,   4 mg Zofran IV  Pain on palpation to mid back, surgical scars to lower back  PER EMS: pool of water pt slipped in had no skid mark to determine angle of fall   History of lower back pain and surgery

## 2018-10-13 NOTE — ED Notes (Signed)
2 white colored earrings given to husband

## 2018-10-13 NOTE — ED Notes (Signed)
Pt without obvious deformity, no bleeding, no hematoma to head observed  C-collar on and aligned  Pt takes gabapentin TID, BCs, and tylenol for chronic back pain

## 2018-10-13 NOTE — ED Provider Notes (Signed)
[facility] Emergency Department Provider Note       Time seen: ----------------------------------------- 10:06 PM on 10/13/2018 -----------------------------------------   I have reviewed the triage vital signs and the nursing notes.  HISTORY   Chief Complaint Fall and Back Pain    HPI Tara Craig is a 58 y.o. female with a history of arthritis, asthma, skin cancer, sedative overdose who presents to the ED for severe back pain.  Patient presents from a [employer] after a fall.  Patient states there was water on the floor in the restaurant and she fell.  She reports no loss of consciousness but did strike her head on the floor.  She is complaining of severe low back pain and presents to the ER vomiting.  She reports chronic low back pain with previous back surgery.  Past Medical History:  Diagnosis Date  . Arthritis   . Asthma   . Back pain, chronic   . Bilateral foot-drop   . Skin cancer     Patient Active Problem List   Diagnosis Date Noted  . Sedative overdose 10/17/2017  . 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 Back pain, chronic 05/31/2012  . Endometriosis 05/31/2012  . Acid reflux 05/31/2012  . HLD (hyperlipidemia) 05/31/2012  . BP (high blood pressure) 05/31/2012  . Current smoker 05/31/2012  . SCC (squamous cell carcinoma), face 05/31/2012    Past Surgical History:  Procedure Laterality Date  . ABDOMINAL HYSTERECTOMY    . APPENDECTOMY    . BACK SURGERY  2010  . TENNIS ELBOW RELEASE/NIRSCHEL PROCEDURE      Allergies Tramadol  Social History Social History   Tobacco Use  . Smoking status: Current Every Day Smoker    Packs/day: 1.50    Types: Cigarettes  . Smokeless tobacco: Never Used  Substance Use Topics  . Alcohol use: No  . Drug use: No    Review of Systems Constitutional: Negative for fever. Cardiovascular: Negative for chest pain. Respiratory: Negative for shortness  of breath. Gastrointestinal: Negative for abdominal pain, positive for vomiting Musculoskeletal: Positive for low back pain Skin: Negative for rash. Neurological: Positive for headache  All systems negative/normal/unremarkable except as stated in the HPI  ____________________________________________   PHYSICAL EXAM:  VITAL SIGNS: ED Triage Vitals  Enc Vitals Group     BP      Pulse      Resp      Temp      Temp src      SpO2      Weight      Height      Head Circumference      Peak Flow      Pain Score      Pain Loc      Pain Edu?      Excl. in [place]?    Constitutional: Alert and oriented.  Mild to moderate distress Eyes: Conjunctivae are normal. Normal extraocular movements. ENT      Head: Normocephalic and atraumatic.      Nose: No congestion/rhinnorhea.      Mouth/Throat: Mucous membranes are moist.      Neck: No stridor. Cardiovascular: Normal rate, regular rhythm. No murmurs, rubs, or gallops. Respiratory: Normal respiratory effort without tachypnea nor retractions. Breath sounds are clear and equal bilaterally. No wheezes/rales/rhonchi. Gastrointestinal: Soft and nontender. Normal bowel sounds Musculoskeletal: Nontender with normal range of motion in extremities. No lower extremity tenderness nor edema. Neurologic:  Normal speech and language.  No gross focal neurologic deficits are appreciated.  Skin:  Skin is warm, dry and intact. No rash noted. Psychiatric: Somewhat agitated mood ____________________________________________  EKG: Interpreted by me.  Sinus rhythm the rate of 91 bpm, normal PR interval, normal QRS, normal QT  ____________________________________________  ED COURSE:  As part of my medical decision making, I reviewed the following data within the [place] History obtained from family if available, nursing notes, old chart and ekg, as well as notes from prior ED visits. Patient presented for a fall that occurred while she was  at a [employer], we will assess with labs and imaging as indicated at this time.   Procedures ____________________________________________   LABS (pertinent positives/negatives)  Labs Reviewed  COMPREHENSIVE METABOLIC PANEL - Abnormal; Notable for the following components:      Result Value   Sodium 146 (*)    All other components within normal limits  CBC WITH DIFFERENTIAL/PLATELET  TROPONIN I  ETHANOL  URINE DRUG SCREEN, QUALITATIVE ([facility] ONLY)    RADIOLOGY Images were viewed by me  CT head, C-spine, thoracic spine x-rays, lumbar spine x-rays IMPRESSION: 1. No acute intracranial abnormality. 2. Interbody spacers at C5-6 and C6-7 with ACDF plate and screws at Z6-0. No complicating features. 3. No acute cervical spine fracture or listhesis.  IMPRESSION: No acute lumbar spine fracture or suspicious osseous lesions. Multilevel degenerative facet arthropathy greatest at L5-S1. ____________________________________________   DIFFERENTIAL DIAGNOSIS   Fall, strain, spasm, fracture, subdural  FINAL ASSESSMENT AND PLAN  Fall, back pain   Plan: The patient had presented for slip and fall at a [employer]. Patient's labs did not reveal any acute process. Patient's imaging was reassuring.  She will be discharged with pain medicine and muscle relaxants.  She is cleared for outpatient follow-up.   Laurence Aly, MD    Note: This note was generated in part or whole with voice recognition software. Voice recognition is usually quite accurate but there are transcription errors that can and very often do occur. I apologize for any typographical errors that were not detected and corrected.     Earleen Newport, MD 10/13/18 [phone]

## 2018-10-13 NOTE — ED Notes (Signed)
Family at bedside. 

## 2018-10-13 NOTE — ED Notes (Signed)
ED Provider at bedside.  Family gone to lobby

## 2018-10-13 NOTE — ED Notes (Addendum)
Patient transported to CT and DG 

## 2018-10-14 NOTE — ED Notes (Signed)
Per phone conversation with spouse of the pt. Enquired on if the pt still had the IV in. " no we took it out before we left and put it in the box on the wall"

## 2018-10-14 NOTE — ED Notes (Signed)
DC training and review of prescriptions and medication att, call into room 6 for critical pt coming to room, excused myself from pt's room   initially looked for another RN to conduct DC, none available

## 2018-10-14 NOTE — ED Notes (Signed)
Tara Craig, NT found cleaning room att  Charge, Tara Craig, called  No evidence of IV removal found

## 2018-11-06 ENCOUNTER — Ambulatory Visit: Payer: Self-pay | Admitting: Nurse Practitioner

## 2018-11-19 ENCOUNTER — Encounter: Payer: Self-pay | Admitting: Emergency Medicine

## 2018-11-19 ENCOUNTER — Emergency Department
Admission: EM | Admit: 2018-11-19 | Discharge: 2018-11-19 | Disposition: A | Discharge status: Home / Self Care | Payer: Self-pay | Attending: Emergency Medicine | Admitting: Emergency Medicine

## 2018-11-19 ENCOUNTER — Emergency Department: Payer: Self-pay

## 2018-11-19 DIAGNOSIS — Z20828 Contact with and (suspected) exposure to other viral communicable diseases: Secondary | ICD-10-CM | POA: Insufficient documentation

## 2018-11-19 DIAGNOSIS — R059 Cough, unspecified: Secondary | ICD-10-CM

## 2018-11-19 DIAGNOSIS — R509 Fever, unspecified: Secondary | ICD-10-CM

## 2018-11-19 DIAGNOSIS — F17210 Nicotine dependence, cigarettes, uncomplicated: Secondary | ICD-10-CM | POA: Insufficient documentation

## 2018-11-19 DIAGNOSIS — J189 Pneumonia, unspecified organism: Secondary | ICD-10-CM | POA: Insufficient documentation

## 2018-11-19 DIAGNOSIS — Z85820 Personal history of malignant melanoma of skin: Secondary | ICD-10-CM | POA: Insufficient documentation

## 2018-11-19 DIAGNOSIS — R05 Cough: Secondary | ICD-10-CM

## 2018-11-19 DIAGNOSIS — Z79899 Other long term (current) drug therapy: Secondary | ICD-10-CM | POA: Insufficient documentation

## 2018-11-19 DIAGNOSIS — J45909 Unspecified asthma, uncomplicated: Secondary | ICD-10-CM | POA: Insufficient documentation

## 2018-11-19 LAB — CBC WITH DIFFERENTIAL/PLATELET
Abs Immature Granulocytes: 0.07 10*3/uL (ref 0.00–0.07)
Basophils Absolute: 0.1 10*3/uL (ref 0.0–0.1)
Basophils Relative: 1 %
Eosinophils Absolute: 0.4 10*3/uL (ref 0.0–0.5)
Eosinophils Relative: 3 %
HCT: 35.7 % — ABNORMAL LOW (ref 36.0–46.0)
Hemoglobin: 11.6 g/dL — ABNORMAL LOW (ref 12.0–15.0)
Immature Granulocytes: 1 %
Lymphocytes Relative: 11 %
Lymphs Abs: 1.5 10*3/uL (ref 0.7–4.0)
MCH: 29.9 pg (ref 26.0–34.0)
MCHC: 32.5 g/dL (ref 30.0–36.0)
MCV: 92 fL (ref 80.0–100.0)
Monocytes Absolute: 1.3 10*3/uL — ABNORMAL HIGH (ref 0.1–1.0)
Monocytes Relative: 10 %
Neutro Abs: 9.9 10*3/uL — ABNORMAL HIGH (ref 1.7–7.7)
Neutrophils Relative %: 74 %
Platelets: 460 10*3/uL — ABNORMAL HIGH (ref 150–400)
RBC: 3.88 MIL/uL (ref 3.87–5.11)
RDW: 12.9 % (ref 11.5–15.5)
WBC: 13.1 10*3/uL — ABNORMAL HIGH (ref 4.0–10.5)
nRBC: 0 % (ref 0.0–0.2)

## 2018-11-19 LAB — BASIC METABOLIC PANEL
Anion gap: 12 (ref 5–15)
BUN: 15 mg/dL (ref 6–20)
CO2: 22 mmol/L (ref 22–32)
Calcium: 7.9 mg/dL — ABNORMAL LOW (ref 8.9–10.3)
Chloride: 105 mmol/L (ref 98–111)
Creatinine, Ser: 0.72 mg/dL (ref 0.44–1.00)
GFR calc Af Amer: >60 mL/min (ref >60)
GFR calc non Af Amer: >60 mL/min (ref >60)
Glucose, Bld: 95 mg/dL (ref 70–99)
Potassium: 3.5 mmol/L (ref 3.5–5.1)
Sodium: 139 mmol/L (ref 135–145)

## 2018-11-19 LAB — TROPONIN I: Troponin I: <0.03 ng/mL (ref <0.03)

## 2018-11-19 MED ORDER — KETOROLAC TROMETHAMINE 30 MG/ML IJ SOLN
30.0000 mg | Freq: Once | INTRAMUSCULAR | Status: AC
  Administered 2018-11-19: 30 mg via INTRAVENOUS
  Filled 2018-11-19: qty 1

## 2018-11-19 MED ORDER — SODIUM CHLORIDE 0.9 % IV SOLN
1.0000 g | Freq: Once | INTRAVENOUS | Status: AC
  Administered 2018-11-19: 1 g via INTRAVENOUS

## 2018-11-19 MED ORDER — AZITHROMYCIN 250 MG PO TABS: ORAL_TABLET | ORAL | 0 refills | Status: AC

## 2018-11-19 MED ORDER — SODIUM CHLORIDE 0.9 % IV SOLN
500.0000 mg | Freq: Once | INTRAVENOUS | Status: AC
  Administered 2018-11-19: 02:00:00 500 mg via INTRAVENOUS
  Filled 2018-11-19: qty 500

## 2018-11-19 MED ORDER — AMOXICILLIN-POT CLAVULANATE ER 1000-62.5 MG PO TB12: 1.0000 | ORAL_TABLET | Freq: Two times a day (BID) | ORAL | 0 refills | Status: AC

## 2018-11-19 NOTE — ED Triage Notes (Signed)
Pt arrived via EMS from home where pt reported fever on Wednesday and cough that started on Thursday. Both have continued since. Tonight pt reported fever of 104F at home, pt took tylenol at 2300. 99.28F on arrival. Dry cough on assessment. Diminished breathe sounds in upper fields. MD at bedside.

## 2018-11-19 NOTE — Discharge Instructions (Addendum)
Please talk to your physician about getting a repeat x-ray in 3-4 weeks to make sure the antibiotics have cleared the infection. Please seek medical attention for any high fevers, chest pain, shortness of breath, change in behavior, persistent vomiting, bloody stool or any other new or concerning symptoms.

## 2018-11-19 NOTE — ED Provider Notes (Signed)
[facility] Emergency Department Provider Note  ____________________________________________   I have reviewed the triage vital signs and the nursing notes.   HISTORY  Chief Complaint Fever and Cough   History limited by: Not Limited   HPI Tara Craig is a 58 y.o. female who presents to the emergency department today because of concern for cough and fever. Patient states that her symptoms started 4 days ago. Initial symptoms was fever. She then developed cough 3 days ago. Temp max of 104. The patient has been taking over the counter medication with minimal relief. The patient does have history of asthma has been using an inhaler without any significant relief. She is also complaining of bilateral rib pain.  Denies any known sick contacts.     Records reviewed. Per medical record review patient has a history of asthma.   Past Medical History:  Diagnosis Date  . Arthritis   . Asthma   . Back pain, chronic   . Bilateral foot-drop   . Skin cancer     Patient Active Problem List   Diagnosis Date Noted  . Sedative overdose 10/17/2017  . 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 Back pain, chronic 05/31/2012  . Endometriosis 05/31/2012  . Acid reflux 05/31/2012  . HLD (hyperlipidemia) 05/31/2012  . BP (high blood pressure) 05/31/2012  . Current smoker 05/31/2012  . SCC (squamous cell carcinoma), face 05/31/2012    Past Surgical History:  Procedure Laterality Date  . ABDOMINAL HYSTERECTOMY    . APPENDECTOMY    . BACK SURGERY  2010  . TENNIS ELBOW RELEASE/NIRSCHEL PROCEDURE      Prior to Admission medications   Medication Sig Start Date End Date Taking? Authorizing Provider  amoxicillin-clavulanate (AUGMENTIN XR) 1000-62.5 MG 12 hr tablet Take 1 tablet by mouth 2 (two) times daily for 14 days. 11/19/18 12/03/18  Nance Pear, MD  azithromycin (ZITHROMAX Z-PAK) 250 MG tablet Take 2 tablets (500 mg) on   Day 1,  followed by 1 tablet (250 mg) once daily on Days 2 through 5. 11/19/18 11/24/18  Nance Pear, MD  busPIRone (BUSPAR) 15 MG tablet Take 15 mg by mouth 3 (three) times daily.    [provider]  cyanocobalamin 1000 MCG tablet Take 1,000 mcg by mouth daily.    [provider]  diazepam (VALIUM) 5 MG tablet Take 1 tablet (5 mg total) by mouth every 8 (eight) hours as needed for muscle spasms. 10/13/18   Earleen Newport, MD  diclofenac sodium (VOLTAREN) 1 % GEL Apply topically as directed.     [provider]  DULoxetine (CYMBALTA) 30 MG capsule Take 30 mg by mouth daily.    [provider]  gabapentin (NEURONTIN) 300 MG capsule Take 900 mg by mouth 3 (three) times daily.    Tye Savoy, MD  mirtazapine (REMERON) 30 MG tablet Take 30 mg by mouth at bedtime.    [provider]  omeprazole (PRILOSEC) 40 MG capsule Take 40 mg by mouth 2 (two) times daily.    [provider]  oxyCODONE-acetaminophen (PERCOCET) 5-325 MG tablet Take 1 tablet by mouth every 8 (eight) hours as needed. 10/13/18   Earleen Newport, MD  topiramate (TOPAMAX) 50 MG tablet Take 1 tablets (75MG ) by mouth every morning and 1 tablet (50MG ) by mouth every evening    Orvil Feil Tito Dine, MD    Allergies Pumpkin flavor and Tramadol  Family History  Problem Relation Age of Onset  .  Cancer Mother        lung  . Heart disease Father     Social History Social History   Tobacco Use  . Smoking status: Current Every Day Smoker    Packs/day: 1.50    Types: Cigarettes  . Smokeless tobacco: Never Used  Substance Use Topics  . Alcohol use: No  . Drug use: No    Review of Systems Constitutional: Positive for fever. Eyes: No visual changes. ENT: No sore throat. Cardiovascular: Positive for bilateral lower chest pain.  Respiratory: Positive for cough, shortness of breath. Gastrointestinal: No abdominal pain.  No nausea, no vomiting.  No diarrhea.    Genitourinary: Negative for dysuria. Musculoskeletal: Negative for back pain. Skin: Negative for rash. Neurological: Negative for headaches, focal weakness or numbness.  ____________________________________________   PHYSICAL EXAM:  VITAL SIGNS: ED Triage Vitals [11/19/18 0104]  Enc Vitals Group     BP 129/73     Pulse Rate 95     Resp 20     Temp 99.2 F (37.3 C)     Temp Source Oral     SpO2 98 %   Constitutional: Alert and oriented.  Eyes: Conjunctivae are normal.  ENT      Head: Normocephalic and atraumatic.      Nose: No congestion/rhinnorhea.      Mouth/Throat: Mucous membranes are moist.      Neck: No stridor. Hematological/Lymphatic/Immunilogical: No cervical lymphadenopathy. Cardiovascular: Normal rate, regular rhythm.  No murmurs, rubs, or gallops.  Respiratory: Normal respiratory effort without tachypnea nor retractions. Breath sounds are clear and equal bilaterally. No wheezes/rales/rhonchi. Gastrointestinal: Soft and non tender. No rebound. No guarding.  Genitourinary: Deferred Musculoskeletal: Normal range of motion in all extremities. No lower extremity edema. Neurologic:  Normal speech and language. No gross focal neurologic deficits are appreciated.  Skin:  Skin is warm, dry and intact. No rash noted. Psychiatric: Mood and affect are normal. Speech and behavior are normal. Patient exhibits appropriate insight and judgment.  ____________________________________________    LABS (pertinent positives/negatives)  Trop <0.03 BMP wnl except ca 7.9 CBC wbc 13.1, hgb 11.6, plt 460  ____________________________________________   EKG  I, Nance Pear, attending physician, personally viewed and interpreted this EKG  EKG Time: 0053 Rate: 94 Rhythm: sinus rhythm Axis: normal Intervals: qtc 410 QRS: narrow ST changes: no st elevation Impression: normal ekg  ____________________________________________    RADIOLOGY  CXR Right sided opacity  concerning for pneumonia  ___________________________________________   PROCEDURES  Procedures  ____________________________________________   INITIAL IMPRESSION / ASSESSMENT AND PLAN / ED COURSE  Pertinent labs & imaging results that were available during my care of the patient were reviewed by me and considered in my medical decision making (see chart for details).   Patient presented to the emergency department because of concern for fever, cough and shortness of breath. X-ray is concerning for pneumonia. Slight leukocytosis on blood work but no fever. No oxygen requirement. Patient was given iv antibiotics in the emergency department. At this point do feel she is a good candidate for outpatient treatment. Discussed findings of pneumonia with patient.   ____________________________________________   FINAL CLINICAL IMPRESSION(S) / ED DIAGNOSES  Final diagnoses:  Pneumonia of right lung due to infectious organism, unspecified part of lung  Cough  Fever, unspecified fever cause     Note: This dictation was prepared with Dragon dictation. Any transcriptional errors that result from this process are unintentional     Nance Pear, MD 11/19/18 [phone]

## 2018-11-19 NOTE — ED Notes (Signed)
Crosby (Husband)

## 2018-11-20 LAB — SARS CORONAVIRUS 2 BY RT PCR (HOSPITAL ORDER, PERFORMED IN ~~LOC~~ HOSPITAL LAB): SARS Coronavirus 2: NEGATIVE

## 2018-11-21 ENCOUNTER — Telehealth: Payer: Self-pay | Admitting: Emergency Medicine

## 2018-11-21 NOTE — Telephone Encounter (Signed)
Called patient and informed her of negative covid 19 test.

## 2018-11-27 ENCOUNTER — Telehealth (HOSPITAL_COMMUNITY): Payer: Self-pay | Admitting: *Deleted

## 2021-06-30 DIAGNOSIS — I471 Supraventricular tachycardia, unspecified: Secondary | ICD-10-CM | POA: Diagnosis present

## 2022-12-17 DIAGNOSIS — R296 Repeated falls: Secondary | ICD-10-CM | POA: Insufficient documentation

## 2023-02-25 ENCOUNTER — Inpatient Hospital Stay
Admission: EM | Admit: 2023-02-25 | Discharge: 2023-03-03 | DRG: 480 | Disposition: A | Discharge status: Home Health | Payer: 59 | Attending: Internal Medicine | Admitting: Internal Medicine

## 2023-02-25 ENCOUNTER — Encounter: Payer: Self-pay | Admitting: Internal Medicine

## 2023-02-25 ENCOUNTER — Other Ambulatory Visit: Payer: Self-pay

## 2023-02-25 ENCOUNTER — Emergency Department: Payer: 59

## 2023-02-25 DIAGNOSIS — B3781 Candidal esophagitis: Secondary | ICD-10-CM | POA: Diagnosis present

## 2023-02-25 DIAGNOSIS — I471 Supraventricular tachycardia, unspecified: Secondary | ICD-10-CM | POA: Diagnosis present

## 2023-02-25 DIAGNOSIS — M5430 Sciatica, unspecified side: Secondary | ICD-10-CM | POA: Insufficient documentation

## 2023-02-25 DIAGNOSIS — Z888 Allergy status to other drugs, medicaments and biological substances status: Secondary | ICD-10-CM

## 2023-02-25 DIAGNOSIS — R55 Syncope and collapse: Secondary | ICD-10-CM | POA: Diagnosis not present

## 2023-02-25 DIAGNOSIS — E785 Hyperlipidemia, unspecified: Secondary | ICD-10-CM | POA: Diagnosis present

## 2023-02-25 DIAGNOSIS — I4719 Other supraventricular tachycardia: Secondary | ICD-10-CM | POA: Diagnosis present

## 2023-02-25 DIAGNOSIS — Z980 Intestinal bypass and anastomosis status: Secondary | ICD-10-CM

## 2023-02-25 DIAGNOSIS — Z85828 Personal history of other malignant neoplasm of skin: Secondary | ICD-10-CM | POA: Diagnosis not present

## 2023-02-25 DIAGNOSIS — K289 Gastrojejunal ulcer, unspecified as acute or chronic, without hemorrhage or perforation: Secondary | ICD-10-CM

## 2023-02-25 DIAGNOSIS — R911 Solitary pulmonary nodule: Secondary | ICD-10-CM | POA: Diagnosis not present

## 2023-02-25 DIAGNOSIS — F1120 Opioid dependence, uncomplicated: Secondary | ICD-10-CM | POA: Diagnosis present

## 2023-02-25 DIAGNOSIS — S72142A Displaced intertrochanteric fracture of left femur, initial encounter for closed fracture: Principal | ICD-10-CM | POA: Diagnosis present

## 2023-02-25 DIAGNOSIS — I10 Essential (primary) hypertension: Secondary | ICD-10-CM | POA: Diagnosis present

## 2023-02-25 DIAGNOSIS — W19XXXA Unspecified fall, initial encounter: Secondary | ICD-10-CM | POA: Diagnosis present

## 2023-02-25 DIAGNOSIS — D62 Acute posthemorrhagic anemia: Secondary | ICD-10-CM | POA: Diagnosis not present

## 2023-02-25 DIAGNOSIS — S72145A Nondisplaced intertrochanteric fracture of left femur, initial encounter for closed fracture: Secondary | ICD-10-CM | POA: Diagnosis not present

## 2023-02-25 DIAGNOSIS — Z5986 Financial insecurity: Secondary | ICD-10-CM

## 2023-02-25 DIAGNOSIS — J4520 Mild intermittent asthma, uncomplicated: Secondary | ICD-10-CM | POA: Diagnosis present

## 2023-02-25 DIAGNOSIS — M51369 Other intervertebral disc degeneration, lumbar region without mention of lumbar back pain or lower extremity pain: Secondary | ICD-10-CM | POA: Insufficient documentation

## 2023-02-25 DIAGNOSIS — F32A Depression, unspecified: Secondary | ICD-10-CM | POA: Diagnosis present

## 2023-02-25 DIAGNOSIS — Z885 Allergy status to narcotic agent status: Secondary | ICD-10-CM

## 2023-02-25 DIAGNOSIS — Z9884 Bariatric surgery status: Secondary | ICD-10-CM | POA: Diagnosis not present

## 2023-02-25 DIAGNOSIS — M797 Fibromyalgia: Secondary | ICD-10-CM | POA: Insufficient documentation

## 2023-02-25 DIAGNOSIS — Z9102 Food additives allergy status: Secondary | ICD-10-CM | POA: Diagnosis not present

## 2023-02-25 DIAGNOSIS — F17210 Nicotine dependence, cigarettes, uncomplicated: Secondary | ICD-10-CM | POA: Diagnosis present

## 2023-02-25 DIAGNOSIS — M5136 Other intervertebral disc degeneration, lumbar region: Secondary | ICD-10-CM | POA: Insufficient documentation

## 2023-02-25 DIAGNOSIS — C3490 Malignant neoplasm of unspecified part of unspecified bronchus or lung: Secondary | ICD-10-CM | POA: Diagnosis present

## 2023-02-25 DIAGNOSIS — E876 Hypokalemia: Secondary | ICD-10-CM | POA: Diagnosis not present

## 2023-02-25 DIAGNOSIS — S72002A Fracture of unspecified part of neck of left femur, initial encounter for closed fracture: Secondary | ICD-10-CM

## 2023-02-25 DIAGNOSIS — Z8249 Family history of ischemic heart disease and other diseases of the circulatory system: Secondary | ICD-10-CM

## 2023-02-25 DIAGNOSIS — K922 Gastrointestinal hemorrhage, unspecified: Secondary | ICD-10-CM | POA: Diagnosis not present

## 2023-02-25 DIAGNOSIS — G4733 Obstructive sleep apnea (adult) (pediatric): Secondary | ICD-10-CM | POA: Diagnosis present

## 2023-02-25 DIAGNOSIS — S7290XA Unspecified fracture of unspecified femur, initial encounter for closed fracture: Secondary | ICD-10-CM

## 2023-02-25 DIAGNOSIS — M461 Sacroiliitis, not elsewhere classified: Secondary | ICD-10-CM | POA: Insufficient documentation

## 2023-02-25 DIAGNOSIS — F419 Anxiety disorder, unspecified: Secondary | ICD-10-CM | POA: Diagnosis present

## 2023-02-25 DIAGNOSIS — I959 Hypotension, unspecified: Secondary | ICD-10-CM

## 2023-02-25 DIAGNOSIS — Z79899 Other long term (current) drug therapy: Secondary | ICD-10-CM | POA: Diagnosis not present

## 2023-02-25 DIAGNOSIS — M76899 Other specified enthesopathies of unspecified lower limb, excluding foot: Secondary | ICD-10-CM | POA: Insufficient documentation

## 2023-02-25 DIAGNOSIS — K284 Chronic or unspecified gastrojejunal ulcer with hemorrhage: Secondary | ICD-10-CM | POA: Diagnosis not present

## 2023-02-25 LAB — PHOSPHORUS: Phosphorus: 4.1 mg/dL (ref 2.5–4.6)

## 2023-02-25 LAB — CBC
HCT: 36.3 % (ref 36.0–46.0)
Hemoglobin: 11.6 g/dL — ABNORMAL LOW (ref 12.0–15.0)
MCH: 30 pg (ref 26.0–34.0)
MCHC: 32 g/dL (ref 30.0–36.0)
MCV: 93.8 fL (ref 80.0–100.0)
Platelets: 300 10*3/uL (ref 150–400)
RBC: 3.87 MIL/uL (ref 3.87–5.11)
RDW: 12 % (ref 11.5–15.5)
WBC: 10.2 10*3/uL (ref 4.0–10.5)
nRBC: 0 % (ref 0.0–0.2)

## 2023-02-25 LAB — BASIC METABOLIC PANEL
Anion gap: 8 (ref 5–15)
BUN: 26 mg/dL — ABNORMAL HIGH (ref 6–20)
CO2: 27 mmol/L (ref 22–32)
Calcium: 8.4 mg/dL — ABNORMAL LOW (ref 8.9–10.3)
Chloride: 101 mmol/L (ref 98–111)
Creatinine, Ser: 0.75 mg/dL (ref 0.44–1.00)
GFR, Estimated: >60 mL/min (ref >60)
Glucose, Bld: 83 mg/dL (ref 70–99)
Potassium: 3.9 mmol/L (ref 3.5–5.1)
Sodium: 136 mmol/L (ref 135–145)

## 2023-02-25 LAB — URINE DRUG SCREEN, QUALITATIVE (ARMC ONLY)
Amphetamines, Ur Screen: NOT DETECTED
Barbiturates, Ur Screen: NOT DETECTED
Benzodiazepine, Ur Scrn: NOT DETECTED
Cannabinoid 50 Ng, Ur ~~LOC~~: NOT DETECTED
Cocaine Metabolite,Ur ~~LOC~~: NOT DETECTED
MDMA (Ecstasy)Ur Screen: NOT DETECTED
Methadone Scn, Ur: NOT DETECTED
Opiate, Ur Screen: NOT DETECTED
Phencyclidine (PCP) Ur S: NOT DETECTED
Tricyclic, Ur Screen: NOT DETECTED

## 2023-02-25 LAB — URINALYSIS, ROUTINE W REFLEX MICROSCOPIC
Bilirubin Urine: NEGATIVE
Glucose, UA: NEGATIVE mg/dL
Hgb urine dipstick: NEGATIVE
Ketones, ur: NEGATIVE mg/dL
Leukocytes,Ua: NEGATIVE
Nitrite: NEGATIVE
Protein, ur: NEGATIVE mg/dL
Specific Gravity, Urine: 1.01 (ref 1.005–1.030)
pH: 5 (ref 5.0–8.0)

## 2023-02-25 LAB — PROTIME-INR
INR: 1.1 (ref 0.8–1.2)
Prothrombin Time: 13.9 seconds (ref 11.4–15.2)

## 2023-02-25 LAB — TYPE AND SCREEN
ABO/RH(D): O POS
Antibody Screen: NEGATIVE

## 2023-02-25 LAB — MAGNESIUM: Magnesium: 2.4 mg/dL (ref 1.7–2.4)

## 2023-02-25 LAB — TSH: TSH: 1.506 u[IU]/mL (ref 0.350–4.500)

## 2023-02-25 LAB — TROPONIN I (HIGH SENSITIVITY): Troponin I (High Sensitivity): 4 ng/L (ref <18)

## 2023-02-25 MED ORDER — GABAPENTIN 100 MG PO CAPS: 200.0000 mg | ORAL_CAPSULE | Freq: Two times a day (BID) | ORAL | Status: DC

## 2023-02-25 MED ORDER — NICOTINE 14 MG/24HR TD PT24: 14.0000 mg | MEDICATED_PATCH | Freq: Every day | TRANSDERMAL | Status: DC | PRN

## 2023-02-25 MED ORDER — ACETAMINOPHEN 650 MG RE SUPP: 650.0000 mg | Freq: Four times a day (QID) | RECTAL | Status: DC | PRN

## 2023-02-25 MED ORDER — ONDANSETRON HCL 4 MG/2ML IJ SOLN
4.0000 mg | Freq: Once | INTRAMUSCULAR | Status: AC
  Administered 2023-02-25: 4 mg via INTRAVENOUS
  Filled 2023-02-25: qty 2

## 2023-02-25 MED ORDER — SODIUM CHLORIDE 0.9 % IV SOLN: INTRAVENOUS | Status: DC

## 2023-02-25 MED ORDER — OXYCODONE HCL 5 MG PO TABS
5.0000 mg | ORAL_TABLET | Freq: Four times a day (QID) | ORAL | Status: DC | PRN
  Administered 2023-02-25: 5 mg via ORAL
  Filled 2023-02-25: qty 1

## 2023-02-25 MED ORDER — HYDROMORPHONE HCL 1 MG/ML IJ SOLN
1.0000 mg | INTRAMUSCULAR | Status: DC | PRN
  Administered 2023-02-25 – 2023-02-26 (×2): 1 mg via INTRAVENOUS
  Filled 2023-02-25 (×2): qty 1

## 2023-02-25 MED ORDER — CEFAZOLIN SODIUM-DEXTROSE 2-4 GM/100ML-% IV SOLN
2.0000 g | INTRAVENOUS | Status: AC
  Administered 2023-02-26: 2 g via INTRAVENOUS

## 2023-02-25 MED ORDER — MORPHINE SULFATE (PF) 4 MG/ML IV SOLN
4.0000 mg | Freq: Once | INTRAVENOUS | Status: AC
  Administered 2023-02-25: 4 mg via INTRAVENOUS
  Filled 2023-02-25: qty 1

## 2023-02-25 MED ORDER — MELOXICAM 7.5 MG PO TABS
15.0000 mg | ORAL_TABLET | Freq: Once | ORAL | Status: AC
  Administered 2023-02-25: 15 mg via ORAL
  Filled 2023-02-25 (×2): qty 2

## 2023-02-25 MED ORDER — GABAPENTIN 300 MG PO CAPS
600.0000 mg | ORAL_CAPSULE | Freq: Two times a day (BID) | ORAL | Status: DC
  Administered 2023-02-25 – 2023-03-03 (×9): 600 mg via ORAL
  Filled 2023-02-25 (×10): qty 2

## 2023-02-25 MED ORDER — CELECOXIB 100 MG PO CAPS
100.0000 mg | ORAL_CAPSULE | Freq: Two times a day (BID) | ORAL | Status: DC
  Administered 2023-02-26 – 2023-03-03 (×8): 100 mg via ORAL
  Filled 2023-02-25 (×11): qty 1

## 2023-02-25 MED ORDER — BUSPIRONE HCL 10 MG PO TABS
15.0000 mg | ORAL_TABLET | Freq: Two times a day (BID) | ORAL | Status: DC
  Administered 2023-02-26 – 2023-03-03 (×8): 15 mg via ORAL
  Filled 2023-02-25 (×9): qty 2

## 2023-02-25 MED ORDER — VENLAFAXINE HCL ER 150 MG PO CP24
150.0000 mg | ORAL_CAPSULE | Freq: Every day | ORAL | Status: DC
  Administered 2023-02-27 – 2023-03-03 (×4): 150 mg via ORAL
  Filled 2023-02-25 (×6): qty 1

## 2023-02-25 MED ORDER — MORPHINE SULFATE (PF) 2 MG/ML IV SOLN: 1.0000 mg | INTRAVENOUS | Status: DC | PRN

## 2023-02-25 MED ORDER — POLYETHYLENE GLYCOL 3350 17 G PO PACK: 17.0000 g | PACK | Freq: Every day | ORAL | Status: DC | PRN

## 2023-02-25 MED ORDER — HYDROCODONE-ACETAMINOPHEN 5-325 MG PO TABS: 1.0000 | ORAL_TABLET | ORAL | Status: DC | PRN

## 2023-02-25 MED ORDER — ACETAMINOPHEN 325 MG PO TABS: 650.0000 mg | ORAL_TABLET | Freq: Four times a day (QID) | ORAL | Status: DC | PRN

## 2023-02-25 MED ORDER — BUPRENORPHINE HCL-NALOXONE HCL 8-2 MG SL SUBL
1.0000 | SUBLINGUAL_TABLET | Freq: Three times a day (TID) | SUBLINGUAL | Status: DC
  Filled 2023-02-25 (×2): qty 1

## 2023-02-25 MED ORDER — TRANEXAMIC ACID-NACL 1000-0.7 MG/100ML-% IV SOLN
1000.0000 mg | INTRAVENOUS | Status: AC
  Administered 2023-02-26: 1000 mg via INTRAVENOUS
  Filled 2023-02-25: qty 100

## 2023-02-25 MED ORDER — CHLORHEXIDINE GLUCONATE 4 % EX SOLN: Freq: Once | CUTANEOUS | Status: DC

## 2023-02-25 MED ORDER — LISINOPRIL 20 MG PO TABS
40.0000 mg | ORAL_TABLET | Freq: Every day | ORAL | Status: DC
  Administered 2023-02-27 – 2023-02-28 (×2): 40 mg via ORAL
  Filled 2023-02-25 (×3): qty 2

## 2023-02-25 NOTE — Assessment & Plan Note (Signed)
No wheezing at this time, nor reported shortness of breath.  - DuoNebs as needed for shortness of breath

## 2023-02-25 NOTE — Assessment & Plan Note (Signed)
EKG with sinus tachycardia at this time.   - Telemetry monitoring

## 2023-02-25 NOTE — Assessment & Plan Note (Signed)
-   Continue home regimen 

## 2023-02-25 NOTE — H&P (Addendum)
History and Physical    Patient: Tara Craig ZOX:[id number] DOB: 1960-08-17 DOA: 02/25/2023 DOS: the patient was seen and examined on 02/25/2023 PCP: [name], MD  Patient coming from: Home  Chief Complaint:  Chief Complaint  Patient presents with   Loss of Consciousness   HPI: Tara Craig is a 62 y.o. female with medical history significant of hypertension, paroxysmal SVT, mild intermittent asthma, opioid dependence on Suboxone, depression, anxiety, hyperlipidemia, recent diagnosis of mild OSA, candida esophagitis, who presents to the ED due to syncopal episode.  Tara Craig states that she has been experiencing syncopal episodes for approximately 1 year now.  They occur at random times and usually when she has been standing still.  She denies any prodromal symptoms including dizziness, headache, palpitations, chest pain, shortness of breath.  After she comes to, she is awake and aware and back to her baseline, but usually notes pain somewhere due to an injury.  She denies any urinary incontinence or tongue biting.  Her husband at bedside states that it looks like she just closes her eyes and falls down; he denies any jerking of the limbs during the syncopal episodes but states that sometimes when she is awake and starting to doze off, her limbs will shake.  Tara Craig states she has been seeing her PCP for this and has undergone a sleep study that showed mild OSA.  She also had a CT of her head with no acute changes either.    Today at approximately 1 to 2 AM, she had another 1 of these episodes, and when she came to, she was having severe left hip pain.  She was unable to bear weight and laid on the ground until EMS was called.  ED course: On arrival to the ED, patient was normotensive at 122/78 with heart rate of 82.  She was saturating at 100% on room air. Initial workup notable for hemoglobin of 11.6, WBC of 10.2, BUN of 26, creatinine 0.75 with GFR above 60.  INR 1.1.   Chest x-ray was obtained that demonstrated no active disease.  Hip x-ray was obtained that demonstrated displaced comminuted intertrochanteric fracture of the left femur.  Orthopedic surgery consulted.  [facility] contacted for admission.  Review of Systems: As mentioned in the history of present illness. All other systems reviewed and are negative.  Past Medical History:  Diagnosis Date   Arthritis    Asthma    Back pain, chronic    Bilateral foot-drop    Skin cancer    Past Surgical History:  Procedure Laterality Date   ABDOMINAL HYSTERECTOMY     APPENDECTOMY     BACK SURGERY  2010   TENNIS ELBOW RELEASE/NIRSCHEL PROCEDURE     Social History:  reports that she has been smoking cigarettes. She has never used smokeless tobacco. She reports that she does not drink alcohol and does not use drugs.  Allergies  Allergen Reactions   Butrans [Buprenorphine] Other (See Comments)    Patient states she developed a severe rash to the adhesive on the patch. She tolerates Buprenorphine    Pumpkin Flavor    Tramadol Nausea And Vomiting    Family History  Problem Relation Age of Onset   Cancer Mother        lung   Heart disease Father     Prior to Admission medications   Medication Sig Start Date End Date Taking? Authorizing Provider  busPIRone (BUSPAR) 15 MG tablet Take 15 mg by mouth 3 (  three) times daily.    [provider]  cyanocobalamin 1000 MCG tablet Take 1,000 mcg by mouth daily.    [provider]  diazepam (VALIUM) 5 MG tablet Take 1 tablet (5 mg total) by mouth every 8 (eight) hours as needed for muscle spasms. 10/13/18   Emily Filbert, MD  diclofenac sodium (VOLTAREN) 1 % GEL Apply topically as directed.     [provider]  DULoxetine (CYMBALTA) 30 MG capsule Take 30 mg by mouth daily.    [provider]  gabapentin (NEURONTIN) 300 MG capsule Take 900 mg by mouth 3 (three) times daily.    [name], MD  mirtazapine (REMERON) 30  MG tablet Take 30 mg by mouth at bedtime.    [provider]  omeprazole (PRILOSEC) 40 MG capsule Take 40 mg by mouth 2 (two) times daily.    [provider]  oxyCODONE-acetaminophen (PERCOCET) 5-325 MG tablet Take 1 tablet by mouth every 8 (eight) hours as needed. 10/13/18   Emily Filbert, MD  topiramate (TOPAMAX) 50 MG tablet Take 1 tablets (75MG ) by mouth every morning and 1 tablet (50MG ) by mouth every evening    [name], MD    Physical Exam: Vitals:   02/25/23 1618 02/25/23 1920 02/25/23 2024 02/25/23 2036  BP:  111/78 (!) 166/98   Pulse:  80 95   Resp:  16 18   Temp:  98.5 F (36.9 C) 97.8 F (36.6 C)   SpO2:  100% 96%   Weight: 57.2 kg   57 kg  Height: 5' (1.524 m)   5' (1.524 m)   Physical Exam Vitals and nursing note reviewed.  Constitutional:      General: She is not in acute distress.    Appearance: She is normal weight. She is not toxic-appearing.  HENT:     Head: Normocephalic and atraumatic.     Mouth/Throat:     Mouth: Mucous membranes are moist.     Pharynx: Oropharynx is clear.  Eyes:     Extraocular Movements: Extraocular movements intact.     Conjunctiva/sclera: Conjunctivae normal.     Pupils: Pupils are equal, round, and reactive to light.  Cardiovascular:     Rate and Rhythm: Normal rate and regular rhythm.     Pulses:          Dorsalis pedis pulses are 2+ on the right side and 1+ on the left side.     Heart sounds: Murmur (2/6 systolic murmur) heard.  Pulmonary:     Effort: Pulmonary effort is normal. No respiratory distress.     Breath sounds: Normal breath sounds. No wheezing, rhonchi or rales.  Abdominal:     General: Bowel sounds are normal.     Palpations: Abdomen is soft.  Musculoskeletal:     Right lower leg: No edema.     Left lower leg: No edema.  Skin:    General: Skin is warm and dry.  Neurological:     General: No focal deficit present.     Mental Status: She is alert and oriented to person,  place, and time.  Psychiatric:        Mood and Affect: Mood normal.        Behavior: Behavior normal.        Thought Content: Thought content normal.        Judgment: Judgment normal.     Data Reviewed: CBC with WBC of 10.2, hemoglobin 11.6, MCV of 93, platelets  of 300 BMP with potassium of 3.9, sodium 136, bicarb 27, glucose 83, BUN 26, creatinine 0.75 with GFR above 60 Troponin 4 INR 1.1  EKG personally reviewed.  Sinus tachycardia with rate of 105.  Motion artifact noted but otherwise no ST or T wave changes consistent with acute ischemia.  QTc 477.  DG Chest 1 View  Result Date: 02/25/2023 CLINICAL DATA:  Trauma, fall, left femoral fracture. EXAM: CHEST  1 VIEW COMPARISON:  None Available. FINDINGS: The heart size and mediastinal contours are within normal limits. Both lungs are clear. Anterior cervical discectomy and fusion hardware. Right shoulder reverse arthroplasty. No acute osseous abnormality. IMPRESSION: No active disease. Electronically Signed   By: Larose Hires D.O.   On: 02/25/2023 16:56   DG Hip Unilat W or Wo Pelvis 2-3 Views Left  Result Date: 02/25/2023 CLINICAL DATA:  Shortened and rotated station of the left leg. Patient reports fall due to syncope. EXAM: DG HIP (WITH OR WITHOUT PELVIS) 2-3V LEFT COMPARISON:  None Available. FINDINGS: There is displaced comminuted intertrochanteric fracture of the left femur with superolateral displacement of the distal femur. IMPRESSION: Displaced comminuted intertrochanteric fracture of the left femur. Electronically Signed   By: Larose Hires D.O.   On: 02/25/2023 16:55    Results are pending, will review when available.  Assessment and Plan:  * Syncope Patient reports she has been having syncopal episodes for 1 year now and has followed up with her PCP.  Unclear etiology.  Echocardiogram in October 2022 demonstrated normal LV and RV size and function.  History of paroxysmal SVTs.  Patient is also on multiple centrally acting  medications and including multiple antihypertensives, placing her at risk of syncope, particularly the combination of hydrochlorothiazide and chlorthalidone.   - Telemetry monitoring - Echocardiogram ordered - Obtain orthostatic vital signs when able - Hold home HCTZ and chlorthalidone  Femur fracture (HCC) Patient is presenting with a left femur fracture after having a syncopal episode.  - Orthopedic surgery consulted; appreciate their recommendations - N.p.o. after midnight - Oxycodone and Dilaudid for pain control - Nonweightbearing until postoperative - PT/OT after surgery per orthopedic surgery  Paroxysmal SVT (supraventricular tachycardia) EKG with sinus tachycardia at this time.   - Telemetry monitoring  Opioid dependence on maintenance agonist therapy, no symptoms (HCC) - Continue home Suboxone  Essential hypertension Blood pressure is within goal at this time.  Patient is on both chlorthalidone and HCTZ.  This places her at risk of orthostatic hypotension, so we will hold at this time  - Continue home lisinopril - Hold home thiazides  Candida esophagitis (HCC) Per chart review, patient recently had an endoscopy during which pathology demonstrated active esophagitis with abundant fungal yeast forms compatible with Candida.  Previous workup for HIV was negative.  Patient has been started on fluconazole.  - Continue daily fluconazole 100 mg  Anxiety and depression - Continue home regimen  Lung nodule Patient recently had a CT of the chest that demonstrated a new spiculated nodule of the right upper lobe concerning for primary malignancy.  She has been referred to oncology and has a PET scan scheduled.  She continues to smoke 2 packs a day we discussed the importance of cessation.  Mild intermittent asthma without complication No wheezing at this time, nor reported shortness of breath.  - DuoNebs as needed for shortness of breath  Advance Care Planning:   Code  Status: Full Code verified by patient  Consults: Orthopedic Surgery  Family Communication: Patient's husband updated  at bedside  Severity of Illness: The appropriate patient status for this patient is INPATIENT. Inpatient status is judged to be reasonable and necessary in order to provide the required intensity of service to ensure the patient's safety. The patient's presenting symptoms, physical exam findings, and initial radiographic and laboratory data in the context of their chronic comorbidities is felt to place them at high risk for further clinical deterioration. Furthermore, it is not anticipated that the patient will be medically stable for discharge from the hospital within 2 midnights of admission.   * I certify that at the point of admission it is my clinical judgment that the patient will require inpatient hospital care spanning beyond 2 midnights from the point of admission due to high intensity of service, high risk for further deterioration and high frequency of surveillance required.*  Author: Verdene Lennert, MD 02/25/2023 8:38 PM  For on call review [email or web address].

## 2023-02-25 NOTE — ED Triage Notes (Signed)
Pt arrives via EMS from home. Per EMS pt sts that she has been having multiple syncopal episodes over the years. Last night pt had a syncoptal episode and since than has been laying on the floor with family taking care of her. Pt has been in pain. Pt has a shorten and rotated left foot. Pt has had of fentanyl via a 20g in a LFA.

## 2023-02-25 NOTE — Plan of Care (Signed)
Patient A&Ox4, from home, independent in room  

## 2023-02-25 NOTE — Assessment & Plan Note (Addendum)
Blood pressure is within goal at this time.  Patient is on both chlorthalidone and HCTZ.  This places her at risk of orthostatic hypotension, so we will hold at this time  - Continue home lisinopril - Hold home thiazides

## 2023-02-25 NOTE — ED Provider Notes (Signed)
[facility] Provider Note    Event Date/Time   First MD Initiated Contact with Patient 02/25/23 1616     (approximate)   History   Loss of Consciousness   HPI  Tara Craig is a 62 y.o. female  who presents to the emergency department today because of concern for left hip pain after a syncopal episode last night. The patient has states that for the past year she has been having syncopal episodes. She has no warnings that its about to happen. She has been seeing her doctors about this issue. Last night she had another episode and felt she landed on her left hip. Having severe pain to that area. Denies any other significant injuries. Denies any recent illness.      Physical Exam   Triage Vital Signs: ED Triage Vitals [02/25/23 1618]  Encounter Vitals Group     BP      Systolic BP Percentile      Diastolic BP Percentile      Pulse      Resp      Temp      Temp src      SpO2      Weight 126 lb (57.2 kg)     Height 5' (1.524 m)     Head Circumference      Peak Flow      Pain Score 7     Pain Loc      Pain Education      Exclude from Growth Chart     Most recent vital signs: There were no vitals filed for this visit.  General: Awake, alert, oriented. CV:  Good peripheral perfusion.  Resp:  Normal effort.  Abd:  No distention.  Other:  Left leg shortened and externally rotated. NV intact. Tender to palpation of the left hip.   ED Results / Procedures / Treatments   Labs (all labs ordered are listed, but only abnormal results are displayed) Labs Reviewed  BASIC METABOLIC PANEL - Abnormal; Notable for the following components:      Result Value   BUN 26 (*)    Calcium 8.4 (*)    All other components within normal limits  CBC - Abnormal; Notable for the following components:   Hemoglobin 11.6 (*)    All other components within normal limits  PROTIME-INR  URINALYSIS, ROUTINE W REFLEX MICROSCOPIC  TYPE AND SCREEN  TYPE AND SCREEN   TROPONIN I (HIGH SENSITIVITY)  TROPONIN I (HIGH SENSITIVITY)     EKG  I, Phineas Semen, attending physician, personally viewed and interpreted this EKG  EKG Time: 1630 Rate: 105 Rhythm: sinus tachycardia Axis: normal Intervals: qtc 477 QRS: narrow ST changes: no st elevation Impression: abnormal ekg   RADIOLOGY I independently interpreted and visualized the left hip. My interpretation: hip fracture Radiology interpretation:  IMPRESSION:  Displaced comminuted intertrochanteric fracture of the left femur.      PROCEDURES:  Critical Care performed: No   MEDICATIONS ORDERED IN ED: Medications  morphine (PF) 4 MG/ML injection 4 mg (has no administration in time range)  ondansetron (ZOFRAN) injection 4 mg (has no administration in time range)     IMPRESSION / MDM / ASSESSMENT AND PLAN / ED COURSE  I reviewed the triage vital signs and the nursing notes.                              Differential diagnosis includes,  but is not limited to, fracture, dislocation, contusion  Patient's presentation is most consistent with acute presentation with potential threat to life or bodily function.   The patient is on the cardiac monitor to evaluate for evidence of arrhythmia and/or significant heart rate changes.  Patient presented to the emergency department today because of concerns for left hip pain after a syncopal episode last night.  Patient with history of syncope.  On exam there is high concern for possible fracture with external rotation and shortening of the left hip.  Will obtain x-ray to evaluate.  X-ray is consistent with left hip fracture.  Discussed with Dr. Hyacinth Meeker with orthopedics.  Discussed findings and plan for OR with the patient.  Discussed with Dr. Huel Cote with the hospitalist service who will evaluate for admission.      FINAL CLINICAL IMPRESSION(S) / ED DIAGNOSES   Final diagnoses:  Syncope, unspecified syncope type  Closed fracture of left hip,  initial encounter [facility])     Note:  This document was prepared using Dragon voice recognition software and may include unintentional dictation errors.    Phineas Semen, MD 02/25/23 [phone]

## 2023-02-25 NOTE — Assessment & Plan Note (Signed)
Patient recently had a CT of the chest that demonstrated a new spiculated nodule of the right upper lobe concerning for primary malignancy.  She has been referred to oncology and has a PET scan scheduled.  She continues to smoke 2 packs a day we discussed the importance of cessation.

## 2023-02-25 NOTE — Assessment & Plan Note (Addendum)
Per chart review, patient recently had an endoscopy during which pathology demonstrated active esophagitis with abundant fungal yeast forms compatible with Candida.  Previous workup for HIV was negative.  Patient has been started on fluconazole.  - Continue daily fluconazole 100 mg

## 2023-02-25 NOTE — Assessment & Plan Note (Addendum)
Patient reports she has been having syncopal episodes for 1 year now and has followed up with her PCP.  Unclear etiology.  Echocardiogram in October 2022 demonstrated normal LV and RV size and function.  History of paroxysmal SVTs.  Patient is also on multiple centrally acting medications and including multiple antihypertensives, placing her at risk of syncope, particularly the combination of hydrochlorothiazide and chlorthalidone.   - Telemetry monitoring - Echocardiogram ordered - Obtain orthostatic vital signs when able - Hold home HCTZ and chlorthalidone

## 2023-02-25 NOTE — Assessment & Plan Note (Signed)
-   Continue home Suboxone 

## 2023-02-25 NOTE — Assessment & Plan Note (Addendum)
Patient is presenting with a left femur fracture after having a syncopal episode.  - Orthopedic surgery consulted; appreciate their recommendations - N.p.o. after midnight - Oxycodone and Dilaudid for pain control - Nonweightbearing until postoperative - PT/OT after surgery per orthopedic surgery

## 2023-02-26 ENCOUNTER — Encounter
Admission: EM | Disposition: A | Discharge status: Home Health | Payer: Self-pay | Source: Home / Self Care | Attending: Internal Medicine

## 2023-02-26 ENCOUNTER — Inpatient Hospital Stay: Payer: 59

## 2023-02-26 ENCOUNTER — Inpatient Hospital Stay (HOSPITAL_COMMUNITY)
Admit: 2023-02-26 | Discharge: 2023-02-26 | Disposition: A | Discharge status: Home / Self Care | Payer: 59 | Attending: Internal Medicine | Admitting: Internal Medicine

## 2023-02-26 ENCOUNTER — Inpatient Hospital Stay: Payer: 59 | Admitting: Anesthesiology

## 2023-02-26 ENCOUNTER — Other Ambulatory Visit: Payer: Self-pay

## 2023-02-26 DIAGNOSIS — S72145A Nondisplaced intertrochanteric fracture of left femur, initial encounter for closed fracture: Secondary | ICD-10-CM | POA: Diagnosis not present

## 2023-02-26 DIAGNOSIS — R55 Syncope and collapse: Secondary | ICD-10-CM | POA: Diagnosis not present

## 2023-02-26 HISTORY — PX: INTRAMEDULLARY (IM) NAIL INTERTROCHANTERIC: SHX5875

## 2023-02-26 LAB — ECHOCARDIOGRAM COMPLETE
AR max vel: 1.52 cm2
AV Peak grad: 8.9 mmHg
Ao pk vel: 1.49 m/s
Area-P 1/2: 4.06 cm2
Height: 60 in
S' Lateral: 2.7 cm
Weight: 2010.6 [oz_av]

## 2023-02-26 LAB — CBC
HCT: 34.3 % — ABNORMAL LOW (ref 36.0–46.0)
Hemoglobin: 11.3 g/dL — ABNORMAL LOW (ref 12.0–15.0)
MCH: 30.2 pg (ref 26.0–34.0)
MCHC: 32.9 g/dL (ref 30.0–36.0)
MCV: 91.7 fL (ref 80.0–100.0)
Platelets: 281 10*3/uL (ref 150–400)
RBC: 3.74 MIL/uL — ABNORMAL LOW (ref 3.87–5.11)
RDW: 12 % (ref 11.5–15.5)
WBC: 8.3 10*3/uL (ref 4.0–10.5)
nRBC: 0 % (ref 0.0–0.2)

## 2023-02-26 LAB — COMPREHENSIVE METABOLIC PANEL WITH GFR
ALT: 17 U/L (ref 0–44)
AST: 16 U/L (ref 15–41)
Albumin: 2.9 g/dL — ABNORMAL LOW (ref 3.5–5.0)
Alkaline Phosphatase: 54 U/L (ref 38–126)
Anion gap: 5 (ref 5–15)
BUN: 21 mg/dL — ABNORMAL HIGH (ref 6–20)
CO2: 29 mmol/L (ref 22–32)
Calcium: 8.3 mg/dL — ABNORMAL LOW (ref 8.9–10.3)
Chloride: 106 mmol/L (ref 98–111)
Creatinine, Ser: 0.74 mg/dL (ref 0.44–1.00)
GFR, Estimated: >60 mL/min
Glucose, Bld: 93 mg/dL (ref 70–99)
Potassium: 4 mmol/L (ref 3.5–5.1)
Sodium: 140 mmol/L (ref 135–145)
Total Bilirubin: 0.3 mg/dL (ref 0.3–1.2)
Total Protein: 5.3 g/dL — ABNORMAL LOW (ref 6.5–8.1)

## 2023-02-26 LAB — SURGICAL PCR SCREEN
MRSA, PCR: NEGATIVE
Staphylococcus aureus: POSITIVE — AB

## 2023-02-26 SURGERY — FIXATION, FRACTURE, INTERTROCHANTERIC, WITH INTRAMEDULLARY ROD
Anesthesia: Spinal | Laterality: Left

## 2023-02-26 MED ORDER — FENTANYL CITRATE (PF) 100 MCG/2ML IJ SOLN
INTRAMUSCULAR | Status: DC | PRN
  Administered 2023-02-26 (×2): 25 ug via INTRAVENOUS

## 2023-02-26 MED ORDER — ACETAMINOPHEN 325 MG PO TABS: 325.0000 mg | ORAL_TABLET | Freq: Four times a day (QID) | ORAL | Status: DC | PRN

## 2023-02-26 MED ORDER — PHENYLEPHRINE HCL (PRESSORS) 10 MG/ML IV SOLN
INTRAVENOUS | Status: DC | PRN
  Administered 2023-02-26 (×2): 80 ug via INTRAVENOUS

## 2023-02-26 MED ORDER — ONDANSETRON HCL 4 MG/2ML IJ SOLN
INTRAMUSCULAR | Status: DC | PRN
  Administered 2023-02-26: 4 mg via INTRAVENOUS

## 2023-02-26 MED ORDER — PROPOFOL 10 MG/ML IV BOLUS
INTRAVENOUS | Status: DC | PRN
Start: 2023-02-26 — End: 2023-02-26
  Administered 2023-02-26: 50 mg via INTRAVENOUS

## 2023-02-26 MED ORDER — METOCLOPRAMIDE HCL 5 MG/ML IJ SOLN
5.0000 mg | Freq: Three times a day (TID) | INTRAMUSCULAR | Status: DC | PRN
  Administered 2023-02-28 (×2): 5 mg via INTRAVENOUS
  Administered 2023-03-01: 10 mg via INTRAVENOUS
  Filled 2023-02-26 (×3): qty 2

## 2023-02-26 MED ORDER — BUPRENORPHINE HCL-NALOXONE HCL 2-0.5 MG SL SUBL
4.0000 | SUBLINGUAL_TABLET | Freq: Three times a day (TID) | SUBLINGUAL | Status: DC
  Administered 2023-02-26 – 2023-03-03 (×12): 4 via SUBLINGUAL
  Filled 2023-02-26 (×13): qty 4

## 2023-02-26 MED ORDER — BUPIVACAINE-EPINEPHRINE (PF) 0.25% -1:200000 IJ SOLN
INTRAMUSCULAR | Status: DC | PRN
  Administered 2023-02-26: 30 mL

## 2023-02-26 MED ORDER — PHENOL 1.4 % MT LIQD: 1.0000 | OROMUCOSAL | Status: DC | PRN

## 2023-02-26 MED ORDER — MIDAZOLAM HCL 5 MG/5ML IJ SOLN
INTRAMUSCULAR | Status: DC | PRN
  Administered 2023-02-26: 2 mg via INTRAVENOUS

## 2023-02-26 MED ORDER — FENTANYL CITRATE (PF) 100 MCG/2ML IJ SOLN
INTRAMUSCULAR | Status: AC
  Filled 2023-02-26: qty 2

## 2023-02-26 MED ORDER — OXYCODONE HCL 5 MG/5ML PO SOLN: 5.0000 mg | Freq: Once | ORAL | Status: DC | PRN

## 2023-02-26 MED ORDER — SODIUM CHLORIDE 0.45 % IV SOLN: INTRAVENOUS | Status: DC

## 2023-02-26 MED ORDER — HYDROCODONE-ACETAMINOPHEN 7.5-325 MG PO TABS
1.0000 | ORAL_TABLET | ORAL | Status: DC | PRN
  Administered 2023-02-26 – 2023-03-03 (×8): 2 via ORAL
  Filled 2023-02-26 (×9): qty 2

## 2023-02-26 MED ORDER — SODIUM CHLORIDE 0.9 % IR SOLN
Status: DC | PRN
  Administered 2023-02-26: 250 mL

## 2023-02-26 MED ORDER — MAGNESIUM HYDROXIDE 400 MG/5ML PO SUSP: 30.0000 mL | Freq: Every day | ORAL | Status: DC | PRN

## 2023-02-26 MED ORDER — VANCOMYCIN HCL IN DEXTROSE 1-5 GM/200ML-% IV SOLN
INTRAVENOUS | Status: AC
  Filled 2023-02-26: qty 200

## 2023-02-26 MED ORDER — OXYCODONE HCL 5 MG PO TABS: 5.0000 mg | ORAL_TABLET | Freq: Once | ORAL | Status: DC | PRN

## 2023-02-26 MED ORDER — DEXMEDETOMIDINE HCL IN NACL 200 MCG/50ML IV SOLN
INTRAVENOUS | Status: DC | PRN
  Administered 2023-02-26: 8 ug via INTRAVENOUS

## 2023-02-26 MED ORDER — FLUCONAZOLE 100 MG PO TABS
100.0000 mg | ORAL_TABLET | Freq: Every day | ORAL | Status: DC
  Filled 2023-02-26: qty 1

## 2023-02-26 MED ORDER — FERROUS SULFATE 325 (65 FE) MG PO TABS
325.0000 mg | ORAL_TABLET | Freq: Every day | ORAL | Status: DC
  Administered 2023-02-27 – 2023-03-03 (×4): 325 mg via ORAL
  Filled 2023-02-26 (×5): qty 1

## 2023-02-26 MED ORDER — FLEET ENEMA 7-19 GM/118ML RE ENEM: 1.0000 | ENEMA | Freq: Once | RECTAL | Status: DC | PRN

## 2023-02-26 MED ORDER — FENTANYL CITRATE (PF) 100 MCG/2ML IJ SOLN: 25.0000 ug | INTRAMUSCULAR | Status: DC | PRN

## 2023-02-26 MED ORDER — ACETAMINOPHEN 10 MG/ML IV SOLN
INTRAVENOUS | Status: AC
  Filled 2023-02-26: qty 100

## 2023-02-26 MED ORDER — CHLORHEXIDINE GLUCONATE CLOTH 2 % EX PADS
6.0000 | MEDICATED_PAD | Freq: Every day | CUTANEOUS | Status: AC
  Administered 2023-02-26 – 2023-03-02 (×5): 6 via TOPICAL

## 2023-02-26 MED ORDER — MIDAZOLAM HCL 2 MG/2ML IJ SOLN
INTRAMUSCULAR | Status: AC
  Filled 2023-02-26: qty 2

## 2023-02-26 MED ORDER — MORPHINE SULFATE (PF) 2 MG/ML IV SOLN
0.5000 mg | INTRAVENOUS | Status: DC | PRN
  Administered 2023-02-26: 1 mg via INTRAVENOUS
  Filled 2023-02-26: qty 1

## 2023-02-26 MED ORDER — PROPOFOL 1000 MG/100ML IV EMUL
INTRAVENOUS | Status: AC
  Filled 2023-02-26: qty 100

## 2023-02-26 MED ORDER — VANCOMYCIN HCL IN DEXTROSE 1-5 GM/200ML-% IV SOLN
1000.0000 mg | Freq: Two times a day (BID) | INTRAVENOUS | Status: AC
  Administered 2023-02-26 – 2023-02-27 (×3): 1000 mg via INTRAVENOUS
  Filled 2023-02-26 (×2): qty 200

## 2023-02-26 MED ORDER — ENOXAPARIN SODIUM 30 MG/0.3ML IJ SOSY
30.0000 mg | PREFILLED_SYRINGE | INTRAMUSCULAR | Status: DC
  Administered 2023-02-27 – 2023-02-28 (×2): 30 mg via SUBCUTANEOUS
  Filled 2023-02-26 (×2): qty 0.3

## 2023-02-26 MED ORDER — HYDROCODONE-ACETAMINOPHEN 5-325 MG PO TABS
1.0000 | ORAL_TABLET | ORAL | Status: DC | PRN
  Administered 2023-02-27 – 2023-02-28 (×4): 2 via ORAL
  Administered 2023-03-01: 1 via ORAL
  Administered 2023-03-01 – 2023-03-03 (×4): 2 via ORAL
  Filled 2023-02-26 (×8): qty 2
  Filled 2023-02-26: qty 1

## 2023-02-26 MED ORDER — ALUM & MAG HYDROXIDE-SIMETH 200-200-20 MG/5ML PO SUSP: 30.0000 mL | ORAL | Status: DC | PRN

## 2023-02-26 MED ORDER — ACETAMINOPHEN 10 MG/ML IV SOLN
INTRAVENOUS | Status: DC | PRN
  Administered 2023-02-26 (×2): 1000 mg via INTRAVENOUS

## 2023-02-26 MED ORDER — MUPIROCIN 2 % EX OINT
1.0000 | TOPICAL_OINTMENT | Freq: Two times a day (BID) | CUTANEOUS | Status: AC
  Administered 2023-02-26 – 2023-03-03 (×10): 1 via NASAL
  Filled 2023-02-26: qty 22

## 2023-02-26 MED ORDER — TRANEXAMIC ACID-NACL 1000-0.7 MG/100ML-% IV SOLN
INTRAVENOUS | Status: AC
  Filled 2023-02-26: qty 100

## 2023-02-26 MED ORDER — METOCLOPRAMIDE HCL 5 MG PO TABS
5.0000 mg | ORAL_TABLET | Freq: Three times a day (TID) | ORAL | Status: DC | PRN
  Administered 2023-03-01: 5 mg via ORAL
  Filled 2023-02-26 (×2): qty 1

## 2023-02-26 MED ORDER — BISACODYL 10 MG RE SUPP
10.0000 mg | Freq: Every day | RECTAL | Status: DC | PRN
  Administered 2023-02-28: 10 mg via RECTAL
  Filled 2023-02-26: qty 1

## 2023-02-26 MED ORDER — CEFAZOLIN SODIUM-DEXTROSE 2-4 GM/100ML-% IV SOLN
INTRAVENOUS | Status: AC
  Filled 2023-02-26: qty 100

## 2023-02-26 MED ORDER — ONDANSETRON HCL 4 MG/2ML IJ SOLN
4.0000 mg | Freq: Four times a day (QID) | INTRAMUSCULAR | Status: DC | PRN
  Administered 2023-02-27 – 2023-03-01 (×5): 4 mg via INTRAVENOUS
  Filled 2023-02-26 (×5): qty 2

## 2023-02-26 MED ORDER — ONDANSETRON HCL 4 MG/2ML IJ SOLN
INTRAMUSCULAR | Status: AC
  Filled 2023-02-26: qty 2

## 2023-02-26 MED ORDER — SENNA 8.6 MG PO TABS
1.0000 | ORAL_TABLET | Freq: Two times a day (BID) | ORAL | Status: DC
  Administered 2023-02-26 – 2023-03-03 (×9): 8.6 mg via ORAL
  Filled 2023-02-26 (×10): qty 1

## 2023-02-26 MED ORDER — TRANEXAMIC ACID-NACL 1000-0.7 MG/100ML-% IV SOLN
1000.0000 mg | Freq: Once | INTRAVENOUS | Status: AC
  Administered 2023-02-26: 1000 mg via INTRAVENOUS

## 2023-02-26 MED ORDER — BUPIVACAINE HCL (PF) 0.5 % IJ SOLN
INTRAMUSCULAR | Status: DC | PRN
  Administered 2023-02-26: 2.5 mL

## 2023-02-26 MED ORDER — PROPOFOL 500 MG/50ML IV EMUL
INTRAVENOUS | Status: DC | PRN
  Administered 2023-02-26: 50 ug/kg/min via INTRAVENOUS

## 2023-02-26 MED ORDER — STERILE WATER FOR IRRIGATION IR SOLN
Status: DC | PRN
  Administered 2023-02-26: 1000 mL

## 2023-02-26 MED ORDER — MORPHINE SULFATE (PF) 4 MG/ML IV SOLN
4.0000 mg | INTRAVENOUS | Status: DC | PRN
  Administered 2023-02-27 – 2023-03-01 (×4): 4 mg via INTRAVENOUS
  Filled 2023-02-26 (×5): qty 1

## 2023-02-26 MED ORDER — FLUCONAZOLE 100 MG PO TABS
100.0000 mg | ORAL_TABLET | Freq: Every day | ORAL | Status: DC
  Administered 2023-02-26 – 2023-03-03 (×5): 100 mg via ORAL
  Filled 2023-02-26 (×6): qty 1

## 2023-02-26 MED ORDER — ONDANSETRON HCL 4 MG PO TABS: 4.0000 mg | ORAL_TABLET | Freq: Four times a day (QID) | ORAL | Status: DC | PRN

## 2023-02-26 MED ORDER — MENTHOL 3 MG MT LOZG: 1.0000 | LOZENGE | OROMUCOSAL | Status: DC | PRN

## 2023-02-26 SURGICAL SUPPLY — 44 items
APL PRP STRL LF DISP 70% ISPRP (MISCELLANEOUS) ×2
BIT DRILL CANN 16 HIP (BIT) IMPLANT
BIT DRILL CANN STP 6/9 HIP (BIT) IMPLANT
BIT DRILL LONG 4.2 (BIT) IMPLANT
BIT DRILL TAPERED 10 (BIT) IMPLANT
BLADE TFNA HELICAL 100 NS (Anchor) IMPLANT
BNDG CMPR 5X4 CHSV STRCH STRL (GAUZE/BANDAGES/DRESSINGS) ×1
BNDG COHESIVE 4X5 TAN STRL LF (GAUZE/BANDAGES/DRESSINGS) ×1 IMPLANT
CHLORAPREP W/TINT 26 (MISCELLANEOUS) ×2 IMPLANT
DRAPE C-ARMOR (DRAPES) IMPLANT
DRAPE INCISE 23X17 STRL (DRAPES) ×1 IMPLANT
DRAPE INCISE IOBAN 23X17 STRL (DRAPES) ×1 IMPLANT
DRSG AQUACEL AG ADV 3.5X10 (GAUZE/BANDAGES/DRESSINGS) IMPLANT
DRSG AQUACEL AG ADV 3.5X14 (GAUZE/BANDAGES/DRESSINGS) IMPLANT
ELECT REM PT RETURN 9FT ADLT (ELECTROSURGICAL) ×1
ELECTRODE REM PT RTRN 9FT ADLT (ELECTROSURGICAL) ×1 IMPLANT
GAUZE SPONGE 4X4 12PLY STRL (GAUZE/BANDAGES/DRESSINGS) ×1 IMPLANT
GAUZE XEROFORM 1X8 LF (GAUZE/BANDAGES/DRESSINGS) IMPLANT
GLOVE INDICATOR 8.0 STRL GRN (GLOVE) ×1 IMPLANT
GLOVE SURG ORTHO 8.5 STRL (GLOVE) ×1 IMPLANT
GOWN STRL REUS W/ TWL LRG LVL3 (GOWN DISPOSABLE) ×1 IMPLANT
GOWN STRL REUS W/TWL LRG LVL3 (GOWN DISPOSABLE) ×1
GOWN STRL REUS W/TWL LRG LVL4 (GOWN DISPOSABLE) ×1 IMPLANT
GUIDEWIRE 3.2X400 (WIRE) IMPLANT
KIT TURNOVER KIT A (KITS) ×1 IMPLANT
MANIFOLD NEPTUNE II (INSTRUMENTS) ×1 IMPLANT
MAT ABSORB FLUID 56X50 GRAY (MISCELLANEOUS) ×1 IMPLANT
NAIL TROCH FIX 10X170 130 (Nail) IMPLANT
NDL SPNL 18GX3.5 QUINCKE PK (NEEDLE) ×1 IMPLANT
NEEDLE SPNL 18GX3.5 QUINCKE PK (NEEDLE) ×1 IMPLANT
NS IRRIG 500ML POUR BTL (IV SOLUTION) ×1 IMPLANT
PACK HIP COMPR (MISCELLANEOUS) ×1 IMPLANT
REAMER ROD DEEP FLUTE 2.5X950 (INSTRUMENTS) IMPLANT
SCREW LOCK STAR 5X36 (Screw) IMPLANT
SOL PREP PVP 2OZ (MISCELLANEOUS) ×1
SOLUTION PREP PVP 2OZ (MISCELLANEOUS) ×1 IMPLANT
STAPLER SKIN PROX 35W (STAPLE) ×1 IMPLANT
SUCTION TUBE FRAZIER 10FR DISP (SUCTIONS) ×1 IMPLANT
SUT VIC AB 0 CT1 36 (SUTURE) ×1 IMPLANT
SUT VIC AB 2-0 CT1 27 (SUTURE) ×1
SUT VIC AB 2-0 CT1 TAPERPNT 27 (SUTURE) ×1 IMPLANT
SYR 30ML LL (SYRINGE) ×1 IMPLANT
TRAP FLUID SMOKE EVACUATOR (MISCELLANEOUS) ×1 IMPLANT
WATER STERILE IRR 500ML POUR (IV SOLUTION) ×1 IMPLANT

## 2023-02-26 NOTE — Anesthesia Preprocedure Evaluation (Addendum)
Anesthesia Evaluation  Patient identified by MRN, date of birth, ID band Patient awake    Reviewed: Allergy & Precautions, NPO status , Patient's Chart, lab work & pertinent test results  History of Anesthesia Complications Negative for: history of anesthetic complications  Airway Mallampati: III  TM Distance: >3 FB Neck ROM: full    Dental  (+) Chipped   Pulmonary asthma , sleep apnea , Current Smoker   Pulmonary exam normal        Cardiovascular hypertension, On Medications + dysrhythmias Supra Ventricular Tachycardia   EKG 7/19 Sinus tachycardia Paired ventricular premature complexes Nonspecific intraventricular conduction delay Borderline ST depression, diffuse leads   Neuro/Psych  PSYCHIATRIC DISORDERS Anxiety Depression     Neuromuscular disease    GI/Hepatic ,GERD  Controlled,,(+)     substance abuse (hx of opiod dependence on suboxone)    Endo/Other  negative endocrine ROS    Renal/GU      Musculoskeletal   Abdominal   Peds  Hematology negative hematology ROS (+)   Anesthesia Other Findings Past Medical History: No date: Arthritis No date: Asthma No date: Back pain, chronic No date: Bilateral foot-drop No date: Skin cancer  Past Surgical History: No date: ABDOMINAL HYSTERECTOMY No date: APPENDECTOMY 2010: BACK SURGERY No date: TENNIS ELBOW RELEASE/NIRSCHEL PROCEDURE  BMI    Body Mass Index: 24.54 kg/m      Reproductive/Obstetrics negative OB ROS                             Anesthesia Physical Anesthesia Plan  ASA: 3  Anesthesia Plan: Spinal   Post-op Pain Management: Toradol IV (intra-op)* and Ofirmev IV (intra-op)*   Induction: Intravenous  PONV Risk Score and Plan: 2 and Propofol infusion and TIVA  Airway Management Planned: Natural Airway and Nasal Cannula  Additional Equipment:   Intra-op Plan:   Post-operative Plan:   Informed Consent: I  have reviewed the patients History and Physical, chart, labs and discussed the procedure including the risks, benefits and alternatives for the proposed anesthesia with the patient or authorized representative who has indicated his/her understanding and acceptance.     Dental Advisory Given  Plan Discussed with: Anesthesiologist, CRNA and Surgeon  Anesthesia Plan Comments: (Patient reports no bleeding problems and no anticoagulant use.  Plan for spinal with backup GA  Patient consented for risks of anesthesia including but not limited to:  - adverse reactions to medications - damage to eyes, teeth, lips or other oral mucosa - nerve damage due to positioning  - risk of bleeding, infection and or nerve damage from spinal that could lead to paralysis - risk of headache or failed spinal - damage to teeth, lips or other oral mucosa - sore throat or hoarseness - damage to heart, brain, nerves, lungs, other parts of body or loss of life  Patient voiced understanding.)        Anesthesia Quick Evaluation

## 2023-02-26 NOTE — Op Note (Signed)
DATE OF SURGERY:  02/26/2023  TIME: 12:00 PM  PATIENT NAME:  Tara Craig  AGE: 62 y.o.  PRE-OPERATIVE DIAGNOSIS:  Left hip Fracture comminuted displaced intertrochanteric  POST-OPERATIVE DIAGNOSIS:  SAME  PROCEDURE:  INTRAMEDULLARY (IM) NAIL INTERTROCHANTERIC left hip  SURGEON:  Valinda Hoar  ASST:  EBL: 150  cc  COMPLICATIONS: None  OPERATIVE IMPLANTS: Synthes trochanteric femoral nail 130 degrees / 10 mm with interlocking helical blade 100 mm and distal locking screw 36 mm.  PREOPERATIVE INDICATIONS:  Tara Craig is a 62 y.o. year old who fell and suffered a hip fracture. She was brought into the ER and then admitted and optimized and then elected for surgical intervention.    The risks benefits and alternatives were discussed with the patient including but not limited to the risks of nonoperative treatment, versus surgical intervention including infection, bleeding, nerve injury, malunion, nonunion, hardware prominence, hardware failure, need for hardware removal, blood clots, cardiopulmonary complications, morbidity, mortality, among others, and they were willing to proceed.    OPERATIVE PROCEDURE:  The patient was brought to the operating room and placed in the supine position.  Spinal anesthesia was administered, with a foley. She was placed on the fracture table.  Closed reduction was performed under C-arm guidance. The length of the femur was also measured using fluoroscopy. Time out was then performed after sterile prep and drape. She received preoperative antibiotics.  Incision was made proximal to the greater trochanter. A guidewire was placed in the appropriate position. Confirmation was made on AP and lateral views. The above-named nail was opened. I opened the proximal femur with a reamer. I then placed the nail by hand and then after reaming to 11/2 mm down the shaft. Once the nail was completely seated, I placed a guidepin into the femoral head into the center  center position through a second incision.  I measured the length, and then reamed the lateral cortex and up into the head. I then placed the helical blade. Slight compression was applied. Anatomic fixation achieved. Bone quality was good.  I then secured the proximal interlock.  The distal locking screw was then placed.  In doing so the head of the screw broke off leaving the threaded part of the screw well into the bone in good position.  I elected to leave this as it was as trying to remove such fragment would be high risk for the rest of the bone.  After confirming the position of the fracture fragments and hardware I then removed the instruments, and took final C-arm pictures AP and lateral the entire length of the leg. Anatomic reconstruction was achieved, and the wounds were irrigated copiously and closed with Vicryl  followed by staples and dry sterile dressing. Sponge and needle count were correct.   The patient was awakened and returned to PACU in stable and satisfactory condition. There no complications and the patient tolerated the procedure well.  She will be partial weightbearing as tolerated, and will be on Lovenox  For DVT prophylaxis.     Valinda Hoar, M.D.

## 2023-02-26 NOTE — Consult Note (Signed)
ORTHOPAEDIC CONSULTATION  REQUESTING PHYSICIAN: Lurene Shadow, MD  Chief Complaint: Left hip pain  HPI: Tara Craig is a 62 y.o. female who complains of left hip pain after a syncopal episode early yesterday morning at home.  She was later brought to the emergency room where exam and x-rays revealed a comminuted, displaced, intertrochanteric fracture of the left hip.  She has had the syncopal episodes for over a year.  Medical service was evaluated now.  She has been cleared for surgery.  I discussed treatment with the patient and recommended surgery if she wishes to ambulate again.  Risk and benefits of surgery and postop protocol were discussed with her.  She wished to proceed.  Past Medical History:  Diagnosis Date   Arthritis    Asthma    Back pain, chronic    Bilateral foot-drop    Skin cancer    Past Surgical History:  Procedure Laterality Date   ABDOMINAL HYSTERECTOMY     APPENDECTOMY     BACK SURGERY  2010   TENNIS ELBOW RELEASE/NIRSCHEL PROCEDURE     Social History   Socioeconomic History   Marital status: Married    Spouse name: Not on file   Number of children: Not on file   Years of education: Not on file   Highest education level: Not on file  Occupational History   Not on file  Tobacco Use   Smoking status: Every Day    Current packs/day: 1.50    Types: Cigarettes   Smokeless tobacco: Never  Vaping Use   Vaping status: Unknown  Substance and Sexual Activity   Alcohol use: No   Drug use: No   Sexual activity: Yes  Other Topics Concern   Not on file  Social History Narrative   Not on file   Social Determinants of Health   Financial Resource Strain: Medium Risk (08/30/2022)   Received from [facility]   Overall Financial Resource Strain (CARDIA)    Difficulty of Paying Living Expenses: Somewhat hard  Food Insecurity: No Food Insecurity (02/25/2023)   Hunger Vital Sign    Worried About Running Out of Food in the Last Year: Never true    Ran  Out of Food in the Last Year: Never true  Transportation Needs: No Transportation Needs (02/25/2023)   PRAPARE - Administrator, Civil Service (Medical): No    Lack of Transportation (Non-Medical): No  Physical Activity: Inactive (05/16/2020)   Received from [facility]   Exercise Vital Sign    Days of Exercise per Week: 0 days    Minutes of Exercise per Session: 0 min  Stress: No Stress Concern Present (05/16/2020)   Received from [facility] - Occupational Stress Questionnaire    Feeling of Stress : Only a little  Social Connections: Moderately Integrated (05/16/2020)   Received from [facility]   Social Connection and Isolation Panel [NHANES]    Frequency of Communication with Friends and Family: More than three times a week    Frequency of Social Gatherings with Friends and Family: More than three times a week    Attends Religious Services: 1 to 4 times per year    Active Member of [employer] or Organizations: No    Attends Banker Meetings: Never    Marital Status: Married   Family History  Problem Relation Age of Onset   Cancer Mother  lung   Heart disease Father    Allergies  Allergen Reactions   Butrans [Buprenorphine] Other (See Comments)    Patient states she developed a severe rash to the adhesive on the patch. She tolerates Buprenorphine    Pumpkin Flavor    Tramadol Nausea And Vomiting   Prior to Admission medications   Medication Sig Start Date End Date Taking? Authorizing Provider  Buprenorphine HCl-Naloxone HCl 8-2 MG FILM Place 1 Film under the tongue 3 (three) times daily. 02/05/23 04/06/23 Yes [provider]  busPIRone (BUSPAR) 15 MG tablet Take 15 mg by mouth 3 (three) times daily.   Yes [provider]  celecoxib (CELEBREX) 100 MG capsule Take 100 mg by mouth 2 (two) times daily. 12/31/22 04/05/23 Yes [provider]  chlorthalidone (HYGROTON) 25 MG tablet  Take 25 mg by mouth daily. 01/11/23 01/11/24 Yes [provider]  clobetasol ointment (TEMOVATE) 0.05 % Apply the medication twice daily to open areas or blisters on the leegs 01/11/23  Yes [provider]  cyanocobalamin 1000 MCG tablet Take 1,000 mcg by mouth daily.   Yes [provider]  gabapentin (NEURONTIN) 300 MG capsule Take 900 mg by mouth 3 (three) times daily.   Yes [name], MD  hydrochlorothiazide (HYDRODIURIL) 25 MG tablet Take 25 mg by mouth daily. 12/07/22  Yes [provider]  lisinopril (ZESTRIL) 40 MG tablet Take 40 mg by mouth daily. 11/23/22  Yes [provider]  omeprazole (PRILOSEC) 40 MG capsule Take 40 mg by mouth 2 (two) times daily.   Yes [provider]  venlafaxine XR (EFFEXOR-XR) 37.5 MG 24 hr capsule Take 150 mg by mouth daily with breakfast. 01/11/23  Yes [provider]  diclofenac sodium (VOLTAREN) 1 % GEL Apply topically as directed.  Patient not taking: Reported on 02/25/2023    [provider]  DULoxetine (CYMBALTA) 30 MG capsule Take 30 mg by mouth daily. Patient not taking: Reported on 02/25/2023    [provider]  mirtazapine (REMERON) 30 MG tablet Take 30 mg by mouth at bedtime. Patient not taking: Reported on 02/25/2023    [provider]  oxyCODONE-acetaminophen (PERCOCET) 5-325 MG tablet Take 1 tablet by mouth every 8 (eight) hours as needed. Patient not taking: Reported on 02/25/2023 10/13/18   Emily Filbert, MD   DG Chest 1 View  Result Date: 02/25/2023 CLINICAL DATA:  Trauma, fall, left femoral fracture. EXAM: CHEST  1 VIEW COMPARISON:  None Available. FINDINGS: The heart size and mediastinal contours are within normal limits. Both lungs are clear. Anterior cervical discectomy and fusion hardware. Right shoulder reverse arthroplasty. No acute osseous abnormality. IMPRESSION: No active disease. Electronically Signed   By: Larose Hires D.O.   On: 02/25/2023  16:56   DG Hip Unilat W or Wo Pelvis 2-3 Views Left  Result Date: 02/25/2023 CLINICAL DATA:  Shortened and rotated station of the left leg. Patient reports fall due to syncope. EXAM: DG HIP (WITH OR WITHOUT PELVIS) 2-3V LEFT COMPARISON:  None Available. FINDINGS: There is displaced comminuted intertrochanteric fracture of the left femur with superolateral displacement of the distal femur. IMPRESSION: Displaced comminuted intertrochanteric fracture of the left femur. Electronically Signed   By: Larose Hires D.O.   On: 02/25/2023 16:55    Positive ROS: All other systems have been reviewed and were otherwise negative with the exception of those mentioned in the HPI and as above.  Physical Exam: General: Alert, no acute distress Cardiovascular: No pedal edema  Respiratory: No cyanosis, no use of accessory musculature GI: No organomegaly, abdomen is soft and non-tender Skin: No lesions in the area of chief complaint Neurologic: Sensation intact distally Psychiatric: Patient is competent for consent with normal mood and affect Lymphatic: No axillary or cervical lymphadenopathy  MUSCULOSKELETAL: Patient alert and oriented.  She complains of pain in the left hip only.  Left leg is shortened externally rotated.  The skin is intact.  Mild swelling.  Neurovascular status is normal.  Right lower extremity is normal.  Both upper extremities are normal and the spine is nontender  Assessment: Comminuted displaced intertrochanteric fracture left hip  Plan: Left hip nailing with a trochanteric fixation nail    Valinda Hoar, MD [phone]   02/26/2023 10:03 AM

## 2023-02-26 NOTE — Progress Notes (Signed)
  Echocardiogram 2D Echocardiogram has been performed.  Tara Craig 02/26/2023, 4:15 PM

## 2023-02-26 NOTE — Plan of Care (Signed)
Patient A&Ox4, from home, up with assist in room using hip precautions. Surgical dressing intact. Pain partially controlled with medication, ice, rest, and repositioning.

## 2023-02-26 NOTE — H&P (Signed)
THE PATIENT WAS SEEN PRIOR TO SURGERY TODAY.  HISTORY, ALLERGIES, HOME MEDICATIONS AND OPERATIVE PROCEDURE WERE REVIEWED. RISKS AND BENEFITS OF SURGERY DISCUSSED WITH PATIENT AGAIN.  NO CHANGES FROM INITIAL HISTORY AND PHYSICAL NOTED.    

## 2023-02-26 NOTE — Transfer of Care (Signed)
Immediate Anesthesia Transfer of Care Note  Patient: Tara Craig  Procedure(s) Performed: INTRAMEDULLARY (IM) NAIL INTERTROCHANTERIC (Left)  Patient Location: PACU  Anesthesia Type:Spinal  Level of Consciousness: awake, alert , and oriented  Airway & Oxygen Therapy: Patient Spontanous Breathing and Patient connected to face mask oxygen  Post-op Assessment: Report given to RN and Post -op Vital signs reviewed and stable  Post vital signs: Reviewed and stable  Last Vitals:  Vitals Value Taken Time  BP 99/52 02/26/23 1201  Temp 98.43f   Pulse    Resp 16 02/26/23 1203  SpO2    Vitals shown include unfiled device data.  Last Pain:  Vitals:   02/26/23 0929  TempSrc:   PainSc: 3          Complications: No notable events documented.

## 2023-02-26 NOTE — Progress Notes (Addendum)
Progress Note    Tara Craig  ZOX:[id number] DOB: July 01, 1961  DOA: 02/25/2023 PCP: [name], MD      Brief Narrative:    Medical records reviewed and are as summarized below:  Tara Craig is a 62 y.o. female with medical history significant for recurrent "blackout" episodes been evaluated by physicians at [facility], hypertension, paroxysmal SVT, mild intermittent asthma, opioid dependence on Suboxone, depression, anxiety, hyperlipidemia, recent diagnosis of mild OSA, recently diagnosed candida esophagitis, recently diagnosed right upper lung nodule suspected to be lung cancer.  He said she has had recurrent episodes of passing out, which she describes as blackouts, for about a year.  She presented to the hospital severe pain in the left hip after she had one of those syncopal episodes.  She was found to have left hip fracture.       Assessment/Plan:   Principal Problem:   Syncope Active Problems:   Femur fracture (HCC)   Paroxysmal SVT (supraventricular tachycardia)   Opioid dependence on maintenance agonist therapy, no symptoms (HCC)   Essential hypertension   Candida esophagitis (HCC)   Anxiety and depression   Lung nodule   Mild intermittent asthma without complication    Body mass index is 24.54 kg/m.   S/p syncope Patient reports she has been having syncopal episodes for 1 year now and has followed up with her PCP.  Unclear etiology.  Echocardiogram in October 2022 demonstrated normal LV and RV size and function.  History of paroxysmal SVTs.   HCTZ and chlorthalidone have been held 2D echo is pending- Telemetry monitoring    Left intertrochanteric femur fracture (HCC) S/p intramedullary nail intertrochanteric left hip on 02/26/2023. Continue analgesics as needed for pain.  Follow-up with orthopedic surgeon.  PT and OT    Paroxysmal SVT (supraventricular tachycardia) EKG showed sinus tachycardia on admission    Opioid  dependence on maintenance agonist therapy, no symptoms (HCC) - Continue home Suboxone    Candida esophagitis (HCC) Chart review showed recent EGD on 02/07/2023.  She was prescribed a 3-week course of fluconazole and she has taken 2 weeks worth of fluconazole thus far. Continue fluconazole     Lung nodule Patient recently had a CT of the chest that demonstrated a new spiculated nodule of the right upper lobe concerning for primary malignancy.  She has been referred to oncology and has a PET scan scheduled on 03/29/2023 and follow-up with oncologist on 03/15/2023.    Other comorbidities include anxiety, hypertension, depression, mild intermittent asthma, tobacco use disorder    Diet Order             Diet regular Room service appropriate? Yes; Fluid consistency: Thin  Diet effective now                            Consultants: Orthopedic surgeon  Procedures: Intramedullary nail intertrochanteric left hip    Medications:    buprenorphine-naloxone  4 tablet Sublingual TID   busPIRone  15 mg Oral BID   celecoxib  100 mg Oral BID   [START ON 02/27/2023] enoxaparin (LOVENOX) injection  30 mg Subcutaneous Q24H   [START ON 02/27/2023] ferrous sulfate  325 mg Oral Q breakfast   fluconazole  100 mg Oral Daily   gabapentin  600 mg Oral BID   lisinopril  40 mg Oral Daily   senna  1 tablet Oral BID   venlafaxine XR  150 mg  Oral Q breakfast   Continuous Infusions:  sodium chloride 75 mL/hr at 02/26/23 1407   sodium chloride 75 mL/hr at 02/26/23 1026   vancomycin Stopped (02/26/23 1416)     Anti-infectives (From admission, onward)    Start     Dose/Rate Route Frequency Ordered Stop   02/26/23 1600  fluconazole (DIFLUCAN) tablet 100 mg        100 mg Oral Daily 02/26/23 1514 03/05/23 0959   02/26/23 1215  vancomycin (VANCOCIN) IVPB 1000 mg/200 mL premix        1,000 mg 200 mL/hr over 60 Minutes Intravenous Every 12 hours 02/26/23 1214 02/28/23 0014   02/26/23 1132   gentamicin 80 mg in 0.9% sodium chloride 250 mL irrigation  Status:  Discontinued          As needed 02/26/23 1132 02/26/23 1159   02/26/23 1000  ceFAZolin (ANCEF) IVPB 2g/100 mL premix        2 g 200 mL/hr over 30 Minutes Intravenous 30 min pre-op 02/25/23 1736 02/26/23 1105              Family Communication/Anticipated D/C date and plan/Code Status   DVT prophylaxis: enoxaparin (LOVENOX) injection 30 mg Start: 02/27/23 0800 SCDs Start: 02/26/23 1355 SCDs Start: 02/25/23 1918     Code Status: Full Code  Family Communication: None Disposition Plan: To be determined   Status is: Inpatient Remains inpatient appropriate because: S/p left hip surgery       Subjective:   Interval events noted.  She had left hip surgery this morning.  She complains of "burning and tingling" in the left hip.  No other complaints.  Objective:    Vitals:   02/26/23 1255 02/26/23 1300 02/26/23 1315 02/26/23 1359  BP:  (!) 143/87 (!) 155/80 125/67  Pulse:    87  Resp: 15 15  17   Temp:   97.6 F (36.4 C) 97.7 F (36.5 C)  TempSrc:    Oral  SpO2:  100%  94%  Weight:      Height:       No data found.   Intake/Output Summary (Last 24 hours) at 02/26/2023 1515 Last data filed at 02/26/2023 1511 Gross per 24 hour  Intake 1102.71 ml  Output 850 ml  Net 252.71 ml   Filed Weights   02/25/23 1618 02/25/23 2036 02/26/23 0748  Weight: 57.2 kg 57 kg 57 kg    Exam:  GEN: NAD SKIN: No rash EYES: No pallor or icterus ENT: MMM CV: RRR PULM: CTA B ABD: soft, ND, NT, +BS CNS: AAO x 3, non focal EXT: Dressing on left hip surgical wound is clean, dry and intact.  Mild left hip tenderness.        Data Reviewed:   I have personally reviewed following labs and imaging studies:  Labs: Labs show the following:   Basic Metabolic Panel: Recent Labs  Lab 02/25/23 1625 02/26/23 0435  NA 136 140  K 3.9 4.0  CL 101 106  CO2 27 29  GLUCOSE 83 93  BUN 26* 21*  CREATININE  0.75 0.74  CALCIUM 8.4* 8.3*  MG 2.4  --   PHOS 4.1  --    GFR Estimated Creatinine Clearance: 59.1 mL/min (by C-G formula based on SCr of 0.74 mg/dL). Liver Function Tests: Recent Labs  Lab 02/26/23 0435  AST 16  ALT 17  ALKPHOS 54  BILITOT 0.3  PROT 5.3*  ALBUMIN 2.9*   No results for input(s): "LIPASE", "AMYLASE" in the  last 168 hours. No results for input(s): "AMMONIA" in the last 168 hours. Coagulation profile Recent Labs  Lab 02/25/23 1755  INR 1.1    CBC: Recent Labs  Lab 02/25/23 1625 02/26/23 0435  WBC 10.2 8.3  HGB 11.6* 11.3*  HCT 36.3 34.3*  MCV 93.8 91.7  PLT 300 281   Cardiac Enzymes: No results for input(s): "CKTOTAL", "CKMB", "CKMBINDEX", "TROPONINI" in the last 168 hours. BNP (last 3 results) No results for input(s): "PROBNP" in the last 8760 hours. CBG: No results for input(s): "GLUCAP" in the last 168 hours. D-Dimer: No results for input(s): "DDIMER" in the last 72 hours. Hgb A1c: No results for input(s): "HGBA1C" in the last 72 hours. Lipid Profile: No results for input(s): "CHOL", "HDL", "LDLCALC", "TRIG", "CHOLHDL", "LDLDIRECT" in the last 72 hours. Thyroid function studies: Recent Labs    02/25/23 1625  TSH 1.506   Anemia work up: No results for input(s): "VITAMINB12", "FOLATE", "FERRITIN", "TIBC", "IRON", "RETICCTPCT" in the last 72 hours. Sepsis Labs: Recent Labs  Lab 02/25/23 1625 02/26/23 0435  WBC 10.2 8.3    Microbiology Recent Results (from the past 240 hour(s))  Surgical pcr screen     Status: Abnormal   Collection Time: 02/26/23  7:45 AM   Specimen: Nasal Mucosa; Nasal Swab  Result Value Ref Range Status   MRSA, PCR NEGATIVE NEGATIVE Final   Staphylococcus aureus POSITIVE (A) NEGATIVE Final    Comment: (NOTE) The Xpert SA Assay (FDA approved for NASAL specimens in patients 94 years of age and older), is one component of a comprehensive surveillance program. It is not intended to diagnose infection nor  to guide or monitor treatment. Performed at [facility], [address] [place], [place] [address]     Procedures and diagnostic studies:  DG C-Arm 1-60 Min-No Report  Result Date: 02/26/2023 Fluoroscopy was utilized by the requesting physician.  No radiographic interpretation.   DG Chest 1 View  Result Date: 02/25/2023 CLINICAL DATA:  Trauma, fall, left femoral fracture. EXAM: CHEST  1 VIEW COMPARISON:  None Available. FINDINGS: The heart size and mediastinal contours are within normal limits. Both lungs are clear. Anterior cervical discectomy and fusion hardware. Right shoulder reverse arthroplasty. No acute osseous abnormality. IMPRESSION: No active disease. Electronically Signed   By: Larose Hires D.O.   On: 02/25/2023 16:56   DG Hip Unilat W or Wo Pelvis 2-3 Views Left  Result Date: 02/25/2023 CLINICAL DATA:  Shortened and rotated station of the left leg. Patient reports fall due to syncope. EXAM: DG HIP (WITH OR WITHOUT PELVIS) 2-3V LEFT COMPARISON:  None Available. FINDINGS: There is displaced comminuted intertrochanteric fracture of the left femur with superolateral displacement of the distal femur. IMPRESSION: Displaced comminuted intertrochanteric fracture of the left femur. Electronically Signed   By: Larose Hires D.O.   On: 02/25/2023 16:55               LOS: 1 day      [facility]   Pager on [email or web address]. If 7PM-7AM, please contact night-coverage at [email or web address]     02/26/2023, 3:15 PM

## 2023-02-26 NOTE — Plan of Care (Signed)
  Problem: Coping: Goal: Level of anxiety will decrease Outcome: Progressing   Problem: Activity: Goal: Risk for activity intolerance will decrease Outcome: Progressing Problem: Education:  Goal: Verbalization of understanding the information provided (i.e., activity precautions, restrictions, etc) will improve Outcome: Progressing    Problem: Safety: Goal: Ability to remain free from injury will improve Outcome: Progressing   Problem: Pain Managment: Goal: General experience of comfort will improve Outcome: Progressing

## 2023-02-26 NOTE — Anesthesia Procedure Notes (Signed)
Spinal  Patient location during procedure: OR Start time: 02/26/2023 10:35 AM End time: 02/26/2023 10:40 AM Reason for block: surgical anesthesia Staffing Performed: anesthesiologist and resident/CRNA  Anesthesiologist: Louie Boston, MD Resident/CRNA: Irving Burton, CRNA Performed by: Irving Burton, CRNA Authorized by: Louie Boston, MD   Preanesthetic Checklist Completed: patient identified, IV checked, site marked, risks and benefits discussed, surgical consent, monitors and equipment checked, pre-op evaluation and timeout performed Spinal Block Patient position: sitting Prep: ChloraPrep Patient monitoring: heart rate, continuous pulse ox, blood pressure and cardiac monitor Approach: midline Location: L3-4 Injection technique: single-shot Needle Needle type: Whitacre, Introducer and Pencan  Needle gauge: 24 G Needle length: 9 cm Assessment Sensory level: T10 Events: CSF return Additional Notes Sterile aseptic technique used throughout the procedure.  Negative paresthesia. Negative blood return. Positive free-flowing CSF. Expiration date of kit checked and confirmed. Patient tolerated procedure well, without complications.

## 2023-02-27 DIAGNOSIS — S72145A Nondisplaced intertrochanteric fracture of left femur, initial encounter for closed fracture: Secondary | ICD-10-CM | POA: Diagnosis not present

## 2023-02-27 LAB — CBC
HCT: 26.4 % — ABNORMAL LOW (ref 36.0–46.0)
Hemoglobin: 8.7 g/dL — ABNORMAL LOW (ref 12.0–15.0)
MCH: 29.9 pg (ref 26.0–34.0)
MCHC: 33 g/dL (ref 30.0–36.0)
MCV: 90.7 fL (ref 80.0–100.0)
Platelets: 271 10*3/uL (ref 150–400)
RBC: 2.91 MIL/uL — ABNORMAL LOW (ref 3.87–5.11)
RDW: 11.9 % (ref 11.5–15.5)
WBC: 10.7 10*3/uL — ABNORMAL HIGH (ref 4.0–10.5)
nRBC: 0 % (ref 0.0–0.2)

## 2023-02-27 LAB — BASIC METABOLIC PANEL
Anion gap: 8 (ref 5–15)
BUN: 11 mg/dL (ref 6–20)
CO2: 25 mmol/L (ref 22–32)
Calcium: 7.9 mg/dL — ABNORMAL LOW (ref 8.9–10.3)
Chloride: 102 mmol/L (ref 98–111)
Creatinine, Ser: 0.54 mg/dL (ref 0.44–1.00)
GFR, Estimated: >60 mL/min (ref >60)
Glucose, Bld: 121 mg/dL — ABNORMAL HIGH (ref 70–99)
Potassium: 3.6 mmol/L (ref 3.5–5.1)
Sodium: 135 mmol/L (ref 135–145)

## 2023-02-27 NOTE — Evaluation (Signed)
Physical Therapy Evaluation Patient Details Name: Tara Craig MRN: [id number] DOB: November 01, 1960 Today's Date: 02/27/2023  History of Present Illness  Patient is a 62 year old female who presented to ED for syncopal episode. Patient has been experiencing syncopal episodes for ~1 year. She had an episode prior to going to ED, when she came to she was having severe L hip pain and unable to bear weight. She was found to have L femur fracture and underwent L intramedullary nail intertrochanteric L hip  on 02/26/23. PMH includes HTN, paroxysmal SVT, asthma, opioid dependence on Suboxone, depression, anxiety, HLD, OSA, candida esophagitis.   Clinical Impression  Patient is a 62 year old female who presents s/p Intramedullary nailing on 02/26/23. Prior to hospital admission, pt was independent and lives with family. She does have a baseline of multiple falls due to syncope reporting 5-6 falls per one month.  Patient is in bed upon PT arrival. She reports she needs to use the restroom. Upon attempting to transfer EOB patient is unable to actively move post surgical limb, refusing all movement. Patient requires Max A to transfer to EOB and verbalizes loudly throughout entire transfer. Patient unable to wait in seated position due to needing to use restroom, transferred and ambulated 3-4 steps to [facility] with assistance with loud verbalizations the entire transfer. Eccentric control to [facility] with cues for hand placements tolerated. Patient voided and refused further movement besides back to bed. Patient returned to bed and requires max A from seated to supine position. Patient placed with needs met and nursing present in room.  Pt would benefit from skilled PT to address noted impairments and functional limitations (see below for any additional details).  Patient unsafe for home at this time due to high level of assistance required.          Assistance Recommended at Discharge Intermittent Supervision/Assistance  If  plan is discharge home, recommend the following:  Can travel by private vehicle  A little help with walking and/or transfers;A lot of help with walking and/or transfers;A lot of help with bathing/dressing/bathroom;Assistance with cooking/housework;Direct supervision/assist for medications management;Help with stairs or ramp for entrance   Yes    Equipment Recommendations Rolling walker (2 wheels);BSC/3in1  Recommendations for Other Services       Functional Status Assessment Patient has had a recent decline in their functional status and demonstrates the ability to make significant improvements in function in a reasonable and predictable amount of time.     Precautions / Restrictions Precautions Precautions: Fall;Other (comment) (L hip s/p intertrochanteric nail) Precaution Comments: PWB Restrictions Weight Bearing Restrictions: Yes LLE Weight Bearing: Partial weight bearing LLE Partial Weight Bearing Percentage or Pounds: 50      Mobility  Bed Mobility Overal bed mobility: Needs Assistance Bed Mobility: Supine to Sit, Sit to Supine     Supine to sit: Max assist Sit to supine: Max assist   General bed mobility comments: Patient requires max cueing and sequencing. Vocalizes loudly at any movement    Transfers Overall transfer level: Needs assistance Equipment used: Rolling walker (2 wheels) Transfers: Sit to/from Stand, Bed to chair/wheelchair/BSC Sit to Stand: Mod assist Stand pivot transfers: Mod assist         General transfer comment: able to stand from bed with mod A with max cueing.    Ambulation/Gait Ambulation/Gait assistance: Mod assist Gait Distance (Feet): 3 Feet Assistive device: Rolling walker (2 wheels) Gait Pattern/deviations: Step-to pattern, Decreased weight shift to left  Stairs            Wheelchair Mobility     Tilt Bed    Modified Rankin (Stroke Patients Only)       Balance Overall balance assessment: Needs  assistance Sitting-balance support: Bilateral upper extremity supported, Feet supported Sitting balance-Leahy Scale: Poor Sitting balance - Comments: patient reports nausea and dizziness Postural control: Right lateral lean Standing balance support: Bilateral upper extremity supported, During functional activity, Reliant on assistive device for balance Standing balance-Leahy Scale: Poor Standing balance comment: heavy reliance on UE's due to fear of LOB.                             Pertinent Vitals/Pain Pain Assessment Pain Assessment: 0-10 Pain Score: 5  Pain Location: L hip, increased to 10/10 with movement Pain Descriptors / Indicators: Aching, Sharp Pain Intervention(s): Limited activity within patient's tolerance, Monitored during session, Patient requesting pain meds-RN notified, Repositioned, Ice applied    Home Living Family/patient expects to be discharged to:: Private residence Living Arrangements: Spouse/significant other Available Help at Discharge: Family Type of Home: Mobile home Home Access: Stairs to enter Entrance Stairs-Rails: Can reach both;Left;Right Entrance Stairs-Number of Steps: 3 steps   Home Layout: One level Home Equipment: Grab bars - tub/shower Additional Comments: Per patient has a walk in shower with rails, lives in a double wide with family.    Prior Function Prior Level of Function : Independent/Modified Independent             Mobility Comments: Per patient is ind at baseline, has multiple falls from syncope (reports 5-6 in a month) ADLs Comments: Ind per patient     Hand Dominance   Dominant Hand: Right    Extremity/Trunk Assessment   Upper Extremity Assessment Upper Extremity Assessment: Defer to OT evaluation    Lower Extremity Assessment Lower Extremity Assessment: RLE deficits/detail;LLE deficits/detail RLE Deficits / Details: grossly 4/5; patient in pain and does not tolerate full MMT, RLE Sensation: WNL RLE  Coordination: WNL LLE: Unable to fully assess due to pain LLE Sensation:  (patient hesitant to be touched)    Cervical / Trunk Assessment Cervical / Trunk Assessment: Normal  Communication   Communication: No difficulties  Cognition Arousal/Alertness: Awake/alert Behavior During Therapy: WFL for tasks assessed/performed Overall Cognitive Status: Within Functional Limits for tasks assessed                                 General Comments: Patient focused on pain        General Comments General comments (skin integrity, edema, etc.): patient has significant cigarette odor    Exercises Total Joint Exercises Ankle Circles/Pumps: Strengthening, Both, 10 reps, Supine Quad Sets: Strengthening, Left, 10 reps, Supine Gluteal Sets: Strengthening, Both, 10 reps, Supine Hip ABduction/ADduction: Strengthening, Both, 10 reps, Supine Other Exercises Other Exercises: Patient educated on role of PT in acute care setting, safe mobility and transfers.   Assessment/Plan    PT Assessment Patient needs continued PT services  PT Problem List Decreased strength;Decreased range of motion;Decreased activity tolerance;Decreased mobility;Decreased balance;Decreased knowledge of use of DME;Pain;Decreased knowledge of precautions       PT Treatment Interventions DME instruction;Gait training;Stair training;Functional mobility training;Therapeutic activities;Therapeutic exercise;Neuromuscular re-education;Balance training;Patient/family education    PT Goals (Current goals can be found in the Care Plan section)  Acute Rehab PT Goals Patient Stated Goal: to return home PT  Goal Formulation: With patient Time For Goal Achievement: 03/13/23 Potential to Achieve Goals: Fair    Frequency BID     Co-evaluation               AM-PAC PT "6 Clicks" Mobility  Outcome Measure Help needed turning from your back to your side while in a flat bed without using bedrails?: A Lot Help needed  moving from lying on your back to sitting on the side of a flat bed without using bedrails?: A Lot Help needed moving to and from a bed to a chair (including a wheelchair)?: A Lot Help needed standing up from a chair using your arms (e.g., wheelchair or bedside chair)?: A Lot Help needed to walk in hospital room?: A Lot Help needed climbing 3-5 steps with a railing? : Total 6 Click Score: 11    End of Session Equipment Utilized During Treatment: Gait belt Activity Tolerance: Patient limited by pain Patient left: in bed;with nursing/sitter in room;with call bell/phone within reach Nurse Communication: Mobility status;Patient requests pain meds;Weight bearing status PT Visit Diagnosis: Unsteadiness on feet (R26.81);Other abnormalities of gait and mobility (R26.89);Repeated falls (R29.6);Muscle weakness (generalized) (M62.81);History of falling (Z91.81);Difficulty in walking, not elsewhere classified (R26.2);Pain Pain - Right/Left: Left Pain - part of body: Hip    Time: 1012-1036 PT Time Calculation (min) (ACUTE ONLY): 24 min   Charges:   PT Evaluation $PT Eval Moderate Complexity: 1 Mod PT Treatments $Therapeutic Activity: 8-22 mins PT General Charges $$ ACUTE PT VISIT: 1 Visit         Precious Bard, DPT  02/27/2023, 11:02 AM

## 2023-02-27 NOTE — Progress Notes (Signed)
Subjective: 1 Day Post-Op Procedure(s) (LRB): INTRAMEDULLARY (IM) NAIL INTERTROCHANTERIC (Left) The patient is alert and oriented and lying quietly in her hospital bed today.  She says her pain is better today than yesterday.  She has had issues with pain management and management in the past and takes Suboxone.  Hemoglobin is down to 8.7 today.  We will watch this closely.  Dressing shows mild right drainage.  Her range of motion of the hip is good.  She she experiences she exhibits very little pain activity when I move the hip around.  Patient reports pain as moderate.  Objective:   VITALS:   Vitals:   02/27/23 0844 02/27/23 1650  BP: 123/73 (!) 140/84  Pulse: 91 79  Resp: 16 16  Temp: 99.1 F (37.3 C) 98.5 F (36.9 C)  SpO2: 96% 100%    Neurologically intact Sensation intact distally Dorsiflexion/Plantar flexion intact Incision: scant drainage  LABS Recent Labs    02/25/23 1625 02/26/23 0435 02/27/23 0440  HGB 11.6* 11.3* 8.7*  HCT 36.3 34.3* 26.4*  WBC 10.2 8.3 10.7*  PLT 300 281 271    Recent Labs    02/25/23 1625 02/26/23 0435 02/27/23 0440  NA 136 140 135  K 3.9 4.0 3.6  BUN 26* 21* 11  CREATININE 0.75 0.74 0.54  GLUCOSE 83 93 121*    Recent Labs    02/25/23 1755  INR 1.1     Assessment/Plan: 1 Day Post-Op Procedure(s) (LRB): INTRAMEDULLARY (IM) NAIL INTERTROCHANTERIC (Left)   Advance diet Up with therapy D/C IV fluids Discharge to SNF when medically stable. Watch hemoglobin closely. Will be discharged on 81 mg ASA twice daily for 6 weeks.

## 2023-02-27 NOTE — Progress Notes (Addendum)
Progress Note    Tara Craig  WUJ:[id number] DOB: April 13, 1961  DOA: 02/25/2023 PCP: [name], MD      Brief Narrative:    Medical records reviewed and are as summarized below:  Tara Craig is a 62 y.o. female with medical history significant for recurrent "blackout" episodes been evaluated by physicians at [facility], hypertension, paroxysmal SVT, mild intermittent asthma, opioid dependence on Suboxone, depression, anxiety, hyperlipidemia, recent diagnosis of mild OSA, recently diagnosed candida esophagitis, recently diagnosed right upper lung nodule suspected to be lung cancer.  He said she has had recurrent episodes of passing out, which she describes as blackouts, for about a year.  She presented to the hospital severe pain in the left hip after she had one of those syncopal episodes.  She was found to have left hip fracture.       Assessment/Plan:   Principal Problem:   Syncope Active Problems:   Femur fracture (HCC)   Paroxysmal SVT (supraventricular tachycardia)   Opioid dependence on maintenance agonist therapy, no symptoms (HCC)   Essential hypertension   Candida esophagitis (HCC)   Anxiety and depression   Lung nodule   Mild intermittent asthma without complication    Body mass index is 24.54 kg/m.   S/p syncope Patient reports she has been having syncopal episodes for 1 year now and has followed up with her PCP.  Unclear etiology.  Echocardiogram in October 2022 demonstrated normal LV and RV size and function.  History of paroxysmal SVTs.   HCTZ and chlorthalidone have been held 2D echo showed EF estimated at 55 to 60%.    Left intertrochanteric femur fracture (HCC) S/p intramedullary nail intertrochanteric left hip on 02/26/2023. Analgesics as needed for pain.  Follow-up with orthopedic surgeon.  PT recommended discharge to SNF   Acute blood loss anemia Hemoglobin dropped from 11.6-8.7.  No indication for blood transfusion  at this time.  Monitor H&H. Discontinue IV fluids    Paroxysmal SVT (supraventricular tachycardia) EKG showed sinus tachycardia on admission    Opioid dependence on maintenance agonist therapy, no symptoms (HCC) - Continue home Suboxone    Candida esophagitis (HCC) Chart review showed recent EGD on 02/07/2023.  She was prescribed a 3-week course of fluconazole and she has taken 2 weeks worth of fluconazole thus far. Continue fluconazole through 03/04/2023     Lung nodule Patient recently had a CT of the chest that demonstrated a new spiculated nodule of the right upper lobe concerning for primary malignancy.  She has been referred to oncology and has a PET scan scheduled on 03/29/2023 and follow-up with oncologist on 03/15/2023.    Other comorbidities include anxiety, hypertension, depression, mild intermittent asthma, tobacco use disorder    Diet Order             Diet regular Room service appropriate? Yes; Fluid consistency: Thin  Diet effective now                            Consultants: Orthopedic surgeon  Procedures: Intramedullary nail intertrochanteric left hip    Medications:    buprenorphine-naloxone  4 tablet Sublingual TID   busPIRone  15 mg Oral BID   celecoxib  100 mg Oral BID   Chlorhexidine Gluconate Cloth  6 each Topical Daily   enoxaparin (LOVENOX) injection  30 mg Subcutaneous Q24H   ferrous sulfate  325 mg Oral Q breakfast   fluconazole  100 mg Oral Daily   gabapentin  600 mg Oral BID   lisinopril  40 mg Oral Daily   mupirocin ointment  1 Application Nasal BID   senna  1 tablet Oral BID   venlafaxine XR  150 mg Oral Q breakfast   Continuous Infusions:  vancomycin 1,000 mg (02/27/23 1155)     Anti-infectives (From admission, onward)    Start     Dose/Rate Route Frequency Ordered Stop   02/26/23 2200  fluconazole (DIFLUCAN) tablet 100 mg        100 mg Oral Daily 02/26/23 1821 03/05/23 0959   02/26/23 1600  fluconazole  (DIFLUCAN) tablet 100 mg  Status:  Discontinued        100 mg Oral Daily 02/26/23 1514 02/26/23 1820   02/26/23 1215  vancomycin (VANCOCIN) IVPB 1000 mg/200 mL premix        1,000 mg 200 mL/hr over 60 Minutes Intravenous Every 12 hours 02/26/23 1214 02/28/23 0014   02/26/23 1132  gentamicin 80 mg in 0.9% sodium chloride 250 mL irrigation  Status:  Discontinued          As needed 02/26/23 1132 02/26/23 1159   02/26/23 1000  ceFAZolin (ANCEF) IVPB 2g/100 mL premix        2 g 200 mL/hr over 30 Minutes Intravenous 30 min pre-op 02/25/23 1736 02/26/23 1105              Family Communication/Anticipated D/C date and plan/Code Status   DVT prophylaxis: enoxaparin (LOVENOX) injection 30 mg Start: 02/27/23 0800 SCDs Start: 02/26/23 1355 SCDs Start: 02/25/23 1918     Code Status: Full Code  Family Communication: None Disposition Plan: To be determined   Status is: Inpatient Remains inpatient appropriate because: S/p left hip surgery       Subjective:   She complained of pain in the left hip, graded as 5/10 in severity.  No other complaints.  Objective:    Vitals:   02/26/23 2005 02/26/23 2329 02/27/23 0459 02/27/23 0844  BP: 114/74 108/67 108/72 123/73  Pulse: 94 89 99 91  Resp: 18 18 18 16   Temp: 98.2 F (36.8 C) 98.5 F (36.9 C) 99.4 F (37.4 C) 99.1 F (37.3 C)  TempSrc:    Oral  SpO2: 100% 95% 93% 96%  Weight:      Height:       No data found.   Intake/Output Summary (Last 24 hours) at 02/27/2023 1225 Last data filed at 02/27/2023 0300 Gross per 24 hour  Intake 1432.35 ml  Output 350 ml  Net 1082.35 ml   Filed Weights   02/25/23 1618 02/25/23 2036 02/26/23 0748  Weight: 57.2 kg 57 kg 57 kg    Exam:  GEN: NAD SKIN: No rash EYES: EOMI ENT: MMM CV: RRR PULM: CTA B ABD: soft, ND, NT, +BS CNS: AAO x 3, non focal EXT: Dressing on left hip surgical incisional wound with bloodstain.  No active bleeding noted.  Mild tenderness in the left  hip       Data Reviewed:   I have personally reviewed following labs and imaging studies:  Labs: Labs show the following:   Basic Metabolic Panel: Recent Labs  Lab 02/25/23 1625 02/26/23 0435 02/27/23 0440  NA 136 140 135  K 3.9 4.0 3.6  CL 101 106 102  CO2 27 29 25   GLUCOSE 83 93 121*  BUN 26* 21* 11  CREATININE 0.75 0.74 0.54  CALCIUM 8.4* 8.3* 7.9*  MG 2.4  --   --  PHOS 4.1  --   --    GFR Estimated Creatinine Clearance: 59.1 mL/min (by C-G formula based on SCr of 0.54 mg/dL). Liver Function Tests: Recent Labs  Lab 02/26/23 0435  AST 16  ALT 17  ALKPHOS 54  BILITOT 0.3  PROT 5.3*  ALBUMIN 2.9*   No results for input(s): "LIPASE", "AMYLASE" in the last 168 hours. No results for input(s): "AMMONIA" in the last 168 hours. Coagulation profile Recent Labs  Lab 02/25/23 1755  INR 1.1    CBC: Recent Labs  Lab 02/25/23 1625 02/26/23 0435 02/27/23 0440  WBC 10.2 8.3 10.7*  HGB 11.6* 11.3* 8.7*  HCT 36.3 34.3* 26.4*  MCV 93.8 91.7 90.7  PLT 300 281 271   Cardiac Enzymes: No results for input(s): "CKTOTAL", "CKMB", "CKMBINDEX", "TROPONINI" in the last 168 hours. BNP (last 3 results) No results for input(s): "PROBNP" in the last 8760 hours. CBG: No results for input(s): "GLUCAP" in the last 168 hours. D-Dimer: No results for input(s): "DDIMER" in the last 72 hours. Hgb A1c: No results for input(s): "HGBA1C" in the last 72 hours. Lipid Profile: No results for input(s): "CHOL", "HDL", "LDLCALC", "TRIG", "CHOLHDL", "LDLDIRECT" in the last 72 hours. Thyroid function studies: Recent Labs    02/25/23 1625  TSH 1.506   Anemia work up: No results for input(s): "VITAMINB12", "FOLATE", "FERRITIN", "TIBC", "IRON", "RETICCTPCT" in the last 72 hours. Sepsis Labs: Recent Labs  Lab 02/25/23 1625 02/26/23 0435 02/27/23 0440  WBC 10.2 8.3 10.7*    Microbiology Recent Results (from the past 240 hour(s))  Surgical pcr screen     Status: Abnormal    Collection Time: 02/26/23  7:45 AM   Specimen: Nasal Mucosa; Nasal Swab  Result Value Ref Range Status   MRSA, PCR NEGATIVE NEGATIVE Final   Staphylococcus aureus POSITIVE (A) NEGATIVE Final    Comment: (NOTE) The Xpert SA Assay (FDA approved for NASAL specimens in patients 72 years of age and older), is one component of a comprehensive surveillance program. It is not intended to diagnose infection nor to guide or monitor treatment. Performed at [facility], [address] [place], [place] [address]     Procedures and diagnostic studies:  ECHOCARDIOGRAM COMPLETE  Result Date: 02/26/2023    ECHOCARDIOGRAM REPORT   Patient Name:   Tara Craig Date of Exam: 02/26/2023 Medical Rec #:  [id number]      Height:       60.0 in Accession #:    [id number]     Weight:       125.7 lb Date of Birth:  Dec 27, 1960      BSA:          1.532 m Patient Age:    61 years       BP:           155/80 mmHg Patient Gender: F              HR:           89 bpm. Exam Location:  [facility] Procedure: 2D Echo Indications:     Syncope R55  History:         Patient has no prior history of Echocardiogram examinations.  Sonographer:     Overton Mam RDCS, FASE Referring Phys:  [id number] Tara Craig Diagnosing Phys: Armanda Magic MD  Sonographer Comments: Suboptimal parasternal window. Image acquisition challenging due to respiratory motion. IMPRESSIONS  1. Left ventricular ejection fraction, by estimation, is 55 to 60%. The left ventricle  has normal function. The left ventricle has no regional wall motion abnormalities. Left ventricular diastolic parameters were normal.  2. Right ventricular systolic function is normal. The right ventricular size is normal. Tricuspid regurgitation signal is inadequate for assessing PA pressure.  3. The mitral valve is degenerative. No evidence of mitral valve regurgitation. No evidence of mitral stenosis.  4. The aortic valve was not well visualized. Aortic valve regurgitation is not  visualized.  5. The inferior vena cava is normal in size with greater than 50% respiratory variability, suggesting right atrial pressure of 3 mmHg. FINDINGS  Left Ventricle: Left ventricular ejection fraction, by estimation, is 55 to 60%. The left ventricle has normal function. The left ventricle has no regional wall motion abnormalities. The left ventricular internal cavity size was normal in size. There is  no left ventricular hypertrophy. Left ventricular diastolic parameters were normal. Normal left ventricular filling pressure. Right Ventricle: The right ventricular size is normal. No increase in right ventricular wall thickness. Right ventricular systolic function is normal. Tricuspid regurgitation signal is inadequate for assessing PA pressure. Left Atrium: Left atrial size was normal in size. Right Atrium: Right atrial size was normal in size. Pericardium: There is no evidence of pericardial effusion. Mitral Valve: The mitral valve is degenerative in appearance. There is mild calcification of the mitral valve leaflet(s). Mild mitral annular calcification. No evidence of mitral valve regurgitation. No evidence of mitral valve stenosis. Tricuspid Valve: The tricuspid valve is normal in structure. Tricuspid valve regurgitation is not demonstrated. No evidence of tricuspid stenosis. Aortic Valve: The aortic valve was not well visualized. Aortic valve regurgitation is not visualized. Aortic valve peak gradient measures 8.9 mmHg. Pulmonic Valve: The pulmonic valve was normal in structure. Pulmonic valve regurgitation is not visualized. No evidence of pulmonic stenosis. Aorta: The aortic root is normal in size and structure. Venous: The inferior vena cava is normal in size with greater than 50% respiratory variability, suggesting right atrial pressure of 3 mmHg. IAS/Shunts: The interatrial septum appears to be lipomatous. No atrial level shunt detected by color flow Doppler.  LEFT VENTRICLE PLAX 2D LVIDd:          3.90 cm   Diastology LVIDs:         2.70 cm   LV e' medial:    7.83 cm/s LV PW:         1.00 cm   LV E/e' medial:  8.5 LV IVS:        0.80 cm   LV e' lateral:   11.90 cm/s LVOT diam:     1.60 cm   LV E/e' lateral: 5.6 LV SV:         33 LV SV Index:   22 LVOT Area:     2.01 cm  RIGHT VENTRICLE RV Basal diam:  2.70 cm RV S prime:     13.60 cm/s TAPSE (M-mode): 1.6 cm LEFT ATRIUM             Index        RIGHT ATRIUM          Index LA diam:        2.70 cm 1.76 cm/m   RA Area:     8.70 cm LA Vol (A2C):   21.2 ml 13.84 ml/m  RA Volume:   16.30 ml 10.64 ml/m LA Vol (A4C):   16.3 ml 10.64 ml/m LA Biplane Vol: 18.6 ml 12.14 ml/m  AORTIC VALVE AV Area (Vmax): 1.52 cm AV Vmax:  149.00 cm/s AV Peak Grad:   8.9 mmHg LVOT Vmax:      113.00 cm/s LVOT Vmean:     69.800 cm/s LVOT VTI:       0.165 m  AORTA Ao Root diam: 2.70 cm MITRAL VALVE MV Area (PHT): 4.06 cm    SHUNTS MV Decel Time: 187 msec    Systemic VTI:  0.16 m MV E velocity: 66.60 cm/s  Systemic Diam: 1.60 cm MV A velocity: 82.30 cm/s MV E/A ratio:  0.81 Armanda Magic MD Electronically signed by Armanda Magic MD Signature Date/Time: 02/26/2023/5:14:27 PM    Final    DG HIP UNILAT WITH PELVIS 2-3 VIEWS LEFT  Result Date: 02/26/2023 CLINICAL DATA:  Elective surgery EXAM: DG HIP (WITH OR WITHOUT PELVIS) 2-3V LEFT COMPARISON:  Left hip x-ray 02/25/2023 FINDINGS: Four intraoperative fluoroscopic views of the left hip were submitted. Left femoral nail and hip screw were placed fixating intratrochanteric fracture. Alignment is anatomic. Fluoroscopy time 32 seconds. Fluoroscopy dose 3.085 micro gray. IMPRESSION: Left femoral nail and hip screw were placed fixating intratrochanteric fracture. Alignment is anatomic. Electronically Signed   By: Darliss Cheney M.D.   On: 02/26/2023 15:39   DG C-Arm 1-60 Min-No Report  Result Date: 02/26/2023 Fluoroscopy was utilized by the requesting physician.  No radiographic interpretation.   DG Chest 1 View  Result Date:  02/25/2023 CLINICAL DATA:  Trauma, fall, left femoral fracture. EXAM: CHEST  1 VIEW COMPARISON:  None Available. FINDINGS: The heart size and mediastinal contours are within normal limits. Both lungs are clear. Anterior cervical discectomy and fusion hardware. Right shoulder reverse arthroplasty. No acute osseous abnormality. IMPRESSION: No active disease. Electronically Signed   By: Larose Hires D.O.   On: 02/25/2023 16:56   DG Hip Unilat W or Wo Pelvis 2-3 Views Left  Result Date: 02/25/2023 CLINICAL DATA:  Shortened and rotated station of the left leg. Patient reports fall due to syncope. EXAM: DG HIP (WITH OR WITHOUT PELVIS) 2-3V LEFT COMPARISON:  None Available. FINDINGS: There is displaced comminuted intertrochanteric fracture of the left femur with superolateral displacement of the distal femur. IMPRESSION: Displaced comminuted intertrochanteric fracture of the left femur. Electronically Signed   By: Larose Hires D.O.   On: 02/25/2023 16:55               LOS: 2 days      [facility]   Pager on [email or web address]. If 7PM-7AM, please contact night-coverage at [email or web address]     02/27/2023, 12:25 PM

## 2023-02-27 NOTE — Anesthesia Postprocedure Evaluation (Signed)
Anesthesia Post Note  Patient: Tara Craig  Procedure(s) Performed: INTRAMEDULLARY (IM) NAIL INTERTROCHANTERIC (Left)  Patient location during evaluation: Nursing Unit Anesthesia Type: Spinal Level of consciousness: awake and alert Pain management: pain level controlled Vital Signs Assessment: post-procedure vital signs reviewed and stable Respiratory status: spontaneous breathing, nonlabored ventilation, respiratory function stable and patient connected to nasal cannula oxygen Cardiovascular status: blood pressure returned to baseline and stable Postop Assessment: no apparent nausea or vomiting Anesthetic complications: no   No notable events documented.   Last Vitals:  Vitals:   02/26/23 2329 02/27/23 0459  BP: 108/67 108/72  Pulse: 89 99  Resp: 18 18  Temp: 36.9 C 37.4 C  SpO2: 95% 93%    Last Pain:  Vitals:   02/27/23 0820  TempSrc:   PainSc: 7                  Corinda Gubler

## 2023-02-28 ENCOUNTER — Encounter: Payer: Self-pay | Admitting: Specialist

## 2023-02-28 DIAGNOSIS — D62 Acute posthemorrhagic anemia: Secondary | ICD-10-CM

## 2023-02-28 DIAGNOSIS — S72145A Nondisplaced intertrochanteric fracture of left femur, initial encounter for closed fracture: Secondary | ICD-10-CM | POA: Diagnosis not present

## 2023-02-28 LAB — BASIC METABOLIC PANEL
Anion gap: 7 (ref 5–15)
BUN: 12 mg/dL (ref 6–20)
CO2: 28 mmol/L (ref 22–32)
Calcium: 8 mg/dL — ABNORMAL LOW (ref 8.9–10.3)
Chloride: 102 mmol/L (ref 98–111)
Creatinine, Ser: 0.53 mg/dL (ref 0.44–1.00)
GFR, Estimated: >60 mL/min (ref >60)
Glucose, Bld: 116 mg/dL — ABNORMAL HIGH (ref 70–99)
Potassium: 3.7 mmol/L (ref 3.5–5.1)
Sodium: 137 mmol/L (ref 135–145)

## 2023-02-28 LAB — CBC
HCT: 25.5 % — ABNORMAL LOW (ref 36.0–46.0)
Hemoglobin: 8.4 g/dL — ABNORMAL LOW (ref 12.0–15.0)
MCH: 29.8 pg (ref 26.0–34.0)
MCHC: 32.9 g/dL (ref 30.0–36.0)
MCV: 90.4 fL (ref 80.0–100.0)
Platelets: 299 10*3/uL (ref 150–400)
RBC: 2.82 MIL/uL — ABNORMAL LOW (ref 3.87–5.11)
RDW: 11.9 % (ref 11.5–15.5)
WBC: 10 10*3/uL (ref 4.0–10.5)
nRBC: 0 % (ref 0.0–0.2)

## 2023-02-28 LAB — HEMOGLOBIN: Hemoglobin: 7.6 g/dL — ABNORMAL LOW (ref 12.0–15.0)

## 2023-02-28 LAB — GLUCOSE, CAPILLARY: Glucose-Capillary: 117 mg/dL — ABNORMAL HIGH (ref 70–99)

## 2023-02-28 MED ORDER — PANTOPRAZOLE INFUSION (NEW) - SIMPLE MED
8.0000 mg/h | INTRAVENOUS | Status: DC
  Administered 2023-03-01 – 2023-03-02 (×3): 8 mg/h via INTRAVENOUS
  Filled 2023-02-28 (×3): qty 100

## 2023-02-28 MED ORDER — PANTOPRAZOLE SODIUM 40 MG IV SOLR: 40.0000 mg | Freq: Two times a day (BID) | INTRAVENOUS | Status: DC

## 2023-02-28 MED ORDER — PANTOPRAZOLE 80MG IVPB - SIMPLE MED
80.0000 mg | Freq: Once | INTRAVENOUS | Status: AC
  Administered 2023-03-01: 80 mg via INTRAVENOUS
  Filled 2023-02-28: qty 100

## 2023-02-28 MED ORDER — BISACODYL 5 MG PO TBEC
5.0000 mg | DELAYED_RELEASE_TABLET | Freq: Once | ORAL | Status: AC
  Administered 2023-02-28: 5 mg via ORAL
  Filled 2023-02-28: qty 1

## 2023-02-28 MED ORDER — SODIUM CHLORIDE 0.9 % IV BOLUS
500.0000 mL | Freq: Once | INTRAVENOUS | Status: AC
  Administered 2023-02-28: 500 mL via INTRAVENOUS

## 2023-02-28 NOTE — Progress Notes (Signed)
Physical Therapy Treatment Patient Details Name: Tara Craig MRN: [id number] DOB: 05-22-1961 Today's Date: 02/28/2023   History of Present Illness Patient is a 62 year old female who presented to ED for syncopal episode. Patient has been experiencing syncopal episodes for ~1 year. She had an episode prior to going to ED, when she came to she was having severe L hip pain and unable to bear weight. She was found to have L femur fracture and underwent L intramedullary nail intertrochanteric L hip  on 02/26/23. PMH includes HTN, paroxysmal SVT, asthma, opioid dependence on Suboxone, depression, anxiety, HLD, OSA, candida esophagitis.    PT Comments  Patient making significant progress compared to previous session. Patient able to complete bed mobility with min guard after instructing her to utilize R LE to assist L off bed. Stood from low bed surface with min guard and ambulated in room with RW and min guard. Cues for heel strike during ambulation. Reinforced WB status to patient and encouraged to continue participating in HEP handout given by previous PT. Discharge plan updated to reflect current functional status.      Assistance Recommended at Discharge Intermittent Supervision/Assistance  If plan is discharge home, recommend the following:  Can travel by private vehicle    A little help with walking and/or transfers;A lot of help with bathing/dressing/bathroom;Assistance with cooking/housework;Direct supervision/assist for medications management;Help with stairs or ramp for entrance      Equipment Recommendations  Rolling  (2 wheels);BSC/3in1    Recommendations for Other Services       Precautions / Restrictions Precautions Precautions: Fall Precaution Comments: PWB Restrictions Weight Bearing Restrictions: Yes LLE Weight Bearing: Partial weight bearing LLE Partial Weight Bearing Percentage or Pounds: 50     Mobility  Bed Mobility Overal bed mobility: Needs  Assistance Bed Mobility: Supine to Sit     Supine to sit: Min guard     General bed mobility comments: Instructed patient on utilizing R LE to assist LLE off the bed. Min guard for safety.    Transfers Overall transfer level: Needs assistance Equipment used: Rolling  (2 wheels) Transfers: Sit to/from Stand Sit to Stand: Min guard           General transfer comment: min guard for safety. Cues for hand placement    Ambulation/Gait Ambulation/Gait assistance: Min guard Gait Distance (Feet): 25 Feet Assistive device: Rolling  (2 wheels) Gait Pattern/deviations: Step-to pattern, Decreased stride length, Decreased weight shift to left Gait velocity: decreased     General Gait Details: cues for heel strike on L and upright posture. Min guard for safety.   Stairs Stairs:  (Educated patient on stair negotiation with use of rails and up with good leg and down with bad leg)           Wheelchair Mobility     Tilt Bed    Modified Rankin (Stroke Patients Only)       Balance Overall balance assessment: Needs assistance Sitting-balance support: No upper extremity supported, Feet supported Sitting balance-Leahy Scale: Good     Standing balance support: Bilateral upper extremity supported, During functional activity, Reliant on assistive device for balance Standing balance-Leahy Scale: Poor                              Cognition Arousal/Alertness: Awake/alert Behavior During Therapy: WFL for tasks assessed/performed Overall Cognitive Status: Within Functional Limits for tasks assessed  Exercises Other Exercises Other Exercises: encouraged patient to continue participating in HEP handout provided by previous PT    General Comments        Pertinent Vitals/Pain Pain Assessment Pain Assessment: Faces Faces Pain Scale: Hurts little more Pain Location: L hip Pain Descriptors /  Indicators: Discomfort, Grimacing Pain Intervention(s): Limited activity within patient's tolerance, Monitored during session, Repositioned    Home Living                          Prior Function            PT Goals (current goals can now be found in the care plan section) Acute Rehab PT Goals Patient Stated Goal: to return home PT Goal Formulation: With patient Time For Goal Achievement: 03/13/23 Potential to Achieve Goals: Fair Progress towards PT goals: Progressing toward goals    Frequency    BID      PT Plan Discharge plan needs to be updated    Co-evaluation              AM-PAC PT "6 Clicks" Mobility   Outcome Measure  Help needed turning from your back to your side while in a flat bed without using bedrails?: A Little Help needed moving from lying on your back to sitting on the side of a flat bed without using bedrails?: A Little Help needed moving to and from a bed to a chair (including a wheelchair)?: A Little Help needed standing up from a chair using your arms (e.g., wheelchair or bedside chair)?: A Little Help needed to walk in hospital room?: A Little Help needed climbing 3-5 steps with a railing? : A Lot 6 Click Score: 17    End of Session Equipment Utilized During Treatment: Gait belt Activity Tolerance: Patient tolerated treatment well Patient left: in chair;with call bell/phone within reach;with family/visitor present Nurse Communication: Mobility status;Weight bearing status PT Visit Diagnosis: Unsteadiness on feet (R26.81);Other abnormalities of gait and mobility (R26.89);Repeated falls (R29.6);Muscle weakness (generalized) (M62.81);History of falling (Z91.81);Difficulty in walking, not elsewhere classified (R26.2);Pain Pain - Right/Left: Left Pain - part of body: Hip     Time: 0981-1914 PT Time Calculation (min) (ACUTE ONLY): 19 min  Charges:    $Gait Training: 8-22 mins PT General Charges $$ ACUTE PT VISIT: 1 Visit                      Maylon Peppers, PT, DPT Physical Therapist - [facility]  [facility]     A  02/28/2023, 12:58 PM

## 2023-02-28 NOTE — Progress Notes (Signed)
Progress Note    Tara Craig  JYN:[id number] DOB: 09-03-1960  DOA: 02/25/2023 PCP: [name], MD      Brief Narrative:    Medical records reviewed and are as summarized below:  Tara Craig is a 62 y.o. female with medical history significant for recurrent "blackout" episodes been evaluated by physicians at [facility], hypertension, paroxysmal SVT, mild intermittent asthma, opioid dependence on Suboxone, depression, anxiety, hyperlipidemia, recent diagnosis of mild OSA, recently diagnosed candida esophagitis, recently diagnosed right upper lung nodule suspected to be lung cancer.  He said she has had recurrent episodes of passing out, which she describes as blackouts, for about a year.  She presented to the hospital severe pain in the left hip after she had one of those syncopal episodes.  She was found to have left hip fracture.       Assessment/Plan:   Principal Problem:   Syncope Active Problems:   Femur fracture (HCC)   Paroxysmal SVT (supraventricular tachycardia)   Opioid dependence on maintenance agonist therapy, no symptoms (HCC)   Essential hypertension   Candida esophagitis (HCC)   Anxiety and depression   Lung nodule   Mild intermittent asthma without complication    Body mass index is 24.54 kg/m.   S/p syncope Patient reports she has been having syncopal episodes for 1 year now and has followed up with her PCP.  Unclear etiology.  Echocardiogram in October 2022 demonstrated normal LV and RV size and function.  History of paroxysmal SVTs.   HCTZ and chlorthalidone have been held 2D echo showed EF estimated at 55 to 60%.    Left intertrochanteric femur fracture (HCC) S/p intramedullary nail intertrochanteric left hip on 02/26/2023. Analgesics as needed for pain.  Follow-up with orthopedic surgeon.  PT recommended discharge to SNF   Acute blood loss anemia Hemoglobin dropped from 11.6-8.7-8.4.  No indication for blood  transfusion at this time.  Repeat CBC tomorrow    Paroxysmal SVT (supraventricular tachycardia) EKG showed sinus tachycardia on admission    Opioid dependence on maintenance agonist therapy, no symptoms (HCC) - Continue home Suboxone    Candida esophagitis (HCC) Chart review showed recent EGD on 02/07/2023.  She was prescribed a 3-week course of fluconazole and she has taken 2 weeks worth of fluconazole thus far. Continue fluconazole through 03/04/2023     Lung nodule Patient recently had a CT of the chest that demonstrated a new spiculated nodule of the right upper lobe concerning for primary malignancy.  She has been referred to oncology and has a PET scan scheduled on 03/11/2023 and follow-up with oncologist on 03/15/2023.    Other comorbidities include anxiety, hypertension, depression, mild intermittent asthma, tobacco use disorder    Diet Order             Diet regular Room service appropriate? Yes; Fluid consistency: Thin  Diet effective now                            Consultants: Orthopedic surgeon  Procedures: Intramedullary nail intertrochanteric left hip    Medications:    buprenorphine-naloxone  4 tablet Sublingual TID   busPIRone  15 mg Oral BID   celecoxib  100 mg Oral BID   Chlorhexidine Gluconate Cloth  6 each Topical Daily   enoxaparin (LOVENOX) injection  30 mg Subcutaneous Q24H   ferrous sulfate  325 mg Oral Q breakfast   fluconazole  100 mg  Oral Daily   gabapentin  600 mg Oral BID   lisinopril  40 mg Oral Daily   mupirocin ointment  1 Application Nasal BID   senna  1 tablet Oral BID   venlafaxine XR  150 mg Oral Q breakfast   Continuous Infusions:     Anti-infectives (From admission, onward)    Start     Dose/Rate Route Frequency Ordered Stop   02/26/23 2200  fluconazole (DIFLUCAN) tablet 100 mg        100 mg Oral Daily 02/26/23 1821 03/05/23 0959   02/26/23 1600  fluconazole (DIFLUCAN) tablet 100 mg  Status:  Discontinued         100 mg Oral Daily 02/26/23 1514 02/26/23 1820   02/26/23 1215  vancomycin (VANCOCIN) IVPB 1000 mg/200 mL premix        1,000 mg 200 mL/hr over 60 Minutes Intravenous Every 12 hours 02/26/23 1214 02/27/23 1255   02/26/23 1132  gentamicin 80 mg in 0.9% sodium chloride 250 mL irrigation  Status:  Discontinued          As needed 02/26/23 1132 02/26/23 1159   02/26/23 1000  ceFAZolin (ANCEF) IVPB 2g/100 mL premix        2 g 200 mL/hr over 30 Minutes Intravenous 30 min pre-op 02/25/23 1736 02/26/23 1105              Family Communication/Anticipated D/C date and plan/Code Status   DVT prophylaxis: enoxaparin (LOVENOX) injection 30 mg Start: 02/27/23 0800 SCDs Start: 02/26/23 1355 SCDs Start: 02/25/23 1918     Code Status: Full Code  Family Communication: None Disposition Plan: Plan to discharge to SNF   Status is: Inpatient Remains inpatient appropriate because: S/p left hip surgery       Subjective:   Interval events noted.  Pain in the left hip is better.  No dizziness.  Objective:    Vitals:   02/27/23 0844 02/27/23 1650 02/28/23 0011 02/28/23 0724  BP: 123/73 (!) 140/84 139/76 (!) 143/80  Pulse: 91 79 90 91  Resp: 16 16 20 16   Temp: 99.1 F (37.3 C) 98.5 F (36.9 C) 98.1 F (36.7 C) 98 F (36.7 C)  TempSrc: Oral Oral  Oral  SpO2: 96% 100% 93% 97%  Weight:      Height:       No data found.  No intake or output data in the 24 hours ending 02/28/23 1151  Filed Weights   02/25/23 1618 02/25/23 2036 02/26/23 0748  Weight: 57.2 kg 57 kg 57 kg    Exam:  GEN: NAD SKIN: Warm and dry EYES: EOMI ENT: MMM CV: RRR PULM: CTA B ABD: soft, ND, NT, +BS CNS: AAO x 3, non focal EXT: Left hip surgical wound with blood stains on dressing but no active bleeding.      Data Reviewed:   I have personally reviewed following labs and imaging studies:  Labs: Labs show the following:   Basic Metabolic Panel: Recent Labs  Lab 02/25/23 1625  02/26/23 0435 02/27/23 0440 02/28/23 0543  NA 136 140 135 137  K 3.9 4.0 3.6 3.7  CL 101 106 102 102  CO2 27 29 25 28   GLUCOSE 83 93 121* 116*  BUN 26* 21* 11 12  CREATININE 0.75 0.74 0.54 0.53  CALCIUM 8.4* 8.3* 7.9* 8.0*  MG 2.4  --   --   --   PHOS 4.1  --   --   --    GFR Estimated Creatinine  Clearance: 59.1 mL/min (by C-G formula based on SCr of 0.53 mg/dL). Liver Function Tests: Recent Labs  Lab 02/26/23 0435  AST 16  ALT 17  ALKPHOS 54  BILITOT 0.3  PROT 5.3*  ALBUMIN 2.9*   No results for input(s): "LIPASE", "AMYLASE" in the last 168 hours. No results for input(s): "AMMONIA" in the last 168 hours. Coagulation profile Recent Labs  Lab 02/25/23 1755  INR 1.1    CBC: Recent Labs  Lab 02/25/23 1625 02/26/23 0435 02/27/23 0440 02/28/23 0543  WBC 10.2 8.3 10.7* 10.0  HGB 11.6* 11.3* 8.7* 8.4*  HCT 36.3 34.3* 26.4* 25.5*  MCV 93.8 91.7 90.7 90.4  PLT 300 281 271 299   Cardiac Enzymes: No results for input(s): "CKTOTAL", "CKMB", "CKMBINDEX", "TROPONINI" in the last 168 hours. BNP (last 3 results) No results for input(s): "PROBNP" in the last 8760 hours. CBG: Recent Labs  Lab 02/28/23 0802  GLUCAP 117*   D-Dimer: No results for input(s): "DDIMER" in the last 72 hours. Hgb A1c: No results for input(s): "HGBA1C" in the last 72 hours. Lipid Profile: No results for input(s): "CHOL", "HDL", "LDLCALC", "TRIG", "CHOLHDL", "LDLDIRECT" in the last 72 hours. Thyroid function studies: Recent Labs    02/25/23 1625  TSH 1.506   Anemia work up: No results for input(s): "VITAMINB12", "FOLATE", "FERRITIN", "TIBC", "IRON", "RETICCTPCT" in the last 72 hours. Sepsis Labs: Recent Labs  Lab 02/25/23 1625 02/26/23 0435 02/27/23 0440 02/28/23 0543  WBC 10.2 8.3 10.7* 10.0    Microbiology Recent Results (from the past 240 hour(s))  Surgical pcr screen     Status: Abnormal   Collection Time: 02/26/23  7:45 AM   Specimen: Nasal Mucosa; Nasal Swab  Result  Value Ref Range Status   MRSA, PCR NEGATIVE NEGATIVE Final   Staphylococcus aureus POSITIVE (A) NEGATIVE Final    Comment: (NOTE) The Xpert SA Assay (FDA approved for NASAL specimens in patients 30 years of age and older), is one component of a comprehensive surveillance program. It is not intended to diagnose infection nor to guide or monitor treatment. Performed at [facility], [address] [place], [place] [address]     Procedures and diagnostic studies:  ECHOCARDIOGRAM COMPLETE  Result Date: 02/26/2023    ECHOCARDIOGRAM REPORT   Patient Name:   Tara Craig Date of Exam: 02/26/2023 Medical Rec #:  [id number]      Height:       60.0 in Accession #:    [id number]     Weight:       125.7 lb Date of Birth:  10/28/1960      BSA:          1.532 m Patient Age:    61 years       BP:           155/80 mmHg Patient Gender: F              HR:           89 bpm. Exam Location:  [facility] Procedure: 2D Echo Indications:     Syncope R55  History:         Patient has no prior history of Echocardiogram examinations.  Sonographer:     Overton Mam RDCS, FASE Referring Phys:  [id number] Verdene Lennert Diagnosing Phys: Armanda Magic MD  Sonographer Comments: Suboptimal parasternal window. Image acquisition challenging due to respiratory motion. IMPRESSIONS  1. Left ventricular ejection fraction, by estimation, is 55 to 60%. The left ventricle has normal  function. The left ventricle has no regional wall motion abnormalities. Left ventricular diastolic parameters were normal.  2. Right ventricular systolic function is normal. The right ventricular size is normal. Tricuspid regurgitation signal is inadequate for assessing PA pressure.  3. The mitral valve is degenerative. No evidence of mitral valve regurgitation. No evidence of mitral stenosis.  4. The aortic valve was not well visualized. Aortic valve regurgitation is not visualized.  5. The inferior vena cava is normal in size with greater than 50%  respiratory variability, suggesting right atrial pressure of 3 mmHg. FINDINGS  Left Ventricle: Left ventricular ejection fraction, by estimation, is 55 to 60%. The left ventricle has normal function. The left ventricle has no regional wall motion abnormalities. The left ventricular internal cavity size was normal in size. There is  no left ventricular hypertrophy. Left ventricular diastolic parameters were normal. Normal left ventricular filling pressure. Right Ventricle: The right ventricular size is normal. No increase in right ventricular wall thickness. Right ventricular systolic function is normal. Tricuspid regurgitation signal is inadequate for assessing PA pressure. Left Atrium: Left atrial size was normal in size. Right Atrium: Right atrial size was normal in size. Pericardium: There is no evidence of pericardial effusion. Mitral Valve: The mitral valve is degenerative in appearance. There is mild calcification of the mitral valve leaflet(s). Mild mitral annular calcification. No evidence of mitral valve regurgitation. No evidence of mitral valve stenosis. Tricuspid Valve: The tricuspid valve is normal in structure. Tricuspid valve regurgitation is not demonstrated. No evidence of tricuspid stenosis. Aortic Valve: The aortic valve was not well visualized. Aortic valve regurgitation is not visualized. Aortic valve peak gradient measures 8.9 mmHg. Pulmonic Valve: The pulmonic valve was normal in structure. Pulmonic valve regurgitation is not visualized. No evidence of pulmonic stenosis. Aorta: The aortic root is normal in size and structure. Venous: The inferior vena cava is normal in size with greater than 50% respiratory variability, suggesting right atrial pressure of 3 mmHg. IAS/Shunts: The interatrial septum appears to be lipomatous. No atrial level shunt detected by color flow Doppler.  LEFT VENTRICLE PLAX 2D LVIDd:         3.90 cm   Diastology LVIDs:         2.70 cm   LV e' medial:    7.83 cm/s LV PW:          1.00 cm   LV E/e' medial:  8.5 LV IVS:        0.80 cm   LV e' lateral:   11.90 cm/s LVOT diam:     1.60 cm   LV E/e' lateral: 5.6 LV SV:         33 LV SV Index:   22 LVOT Area:     2.01 cm  RIGHT VENTRICLE RV Basal diam:  2.70 cm RV S prime:     13.60 cm/s TAPSE (M-mode): 1.6 cm LEFT ATRIUM             Index        RIGHT ATRIUM          Index LA diam:        2.70 cm 1.76 cm/m   RA Area:     8.70 cm LA Vol (A2C):   21.2 ml 13.84 ml/m  RA Volume:   16.30 ml 10.64 ml/m LA Vol (A4C):   16.3 ml 10.64 ml/m LA Biplane Vol: 18.6 ml 12.14 ml/m  AORTIC VALVE AV Area (Vmax): 1.52 cm AV Vmax:  149.00 cm/s AV Peak Grad:   8.9 mmHg LVOT Vmax:      113.00 cm/s LVOT Vmean:     69.800 cm/s LVOT VTI:       0.165 m  AORTA Ao Root diam: 2.70 cm MITRAL VALVE MV Area (PHT): 4.06 cm    SHUNTS MV Decel Time: 187 msec    Systemic VTI:  0.16 m MV E velocity: 66.60 cm/s  Systemic Diam: 1.60 cm MV A velocity: 82.30 cm/s MV E/A ratio:  0.81 Armanda Magic MD Electronically signed by Armanda Magic MD Signature Date/Time: 02/26/2023/5:14:27 PM    Final    DG HIP UNILAT WITH PELVIS 2-3 VIEWS LEFT  Result Date: 02/26/2023 CLINICAL DATA:  Elective surgery EXAM: DG HIP (WITH OR WITHOUT PELVIS) 2-3V LEFT COMPARISON:  Left hip x-ray 02/25/2023 FINDINGS: Four intraoperative fluoroscopic views of the left hip were submitted. Left femoral nail and hip screw were placed fixating intratrochanteric fracture. Alignment is anatomic. Fluoroscopy time 32 seconds. Fluoroscopy dose 3.085 micro gray. IMPRESSION: Left femoral nail and hip screw were placed fixating intratrochanteric fracture. Alignment is anatomic. Electronically Signed   By: Darliss Cheney M.D.   On: 02/26/2023 15:39   DG C-Arm 1-60 Min-No Report  Result Date: 02/26/2023 Fluoroscopy was utilized by the requesting physician.  No radiographic interpretation.               LOS: 3 days      [facility]   Pager on [email or web address]. If 7PM-7AM,  please contact night-coverage at [email or web address]     02/28/2023, 11:51 AM

## 2023-02-28 NOTE — Progress Notes (Addendum)
CROSS COVER NOTE  NAME: Tara Craig MRN: [id number] DOB : 02-17-1961    Concern as stated by nurse / staff   29 female with medical history significant of hypertension, paroxysmal SVT, mild intermittent asthma, opioid dependence on Suboxone, depression, anxiety, hyperlipidemia, recent diagnosis of mild OSA, candida esophagitis, who presents to the ED due to syncopal episode,sustained broken left hip, 2 Days Post-Op Proc INTRAMEDULLARY (IM) NAIL INTERTROCHANTERIC (Left). Pt recently dx with lung cancer. Pt n/v all day with c/o abdominal pain. I just gave pt a dose of Zofran and admin a dulcolax suppository. Afterwards, she vomited 40-50 ml of bloody emesis. Please advise. I will get a set of vital signs No chest pain or shortness of breath. Thanks!  7 mins CM      Pertinent findings on chart review: Last progress notes, vitals, meds reviewed: Admitted with left femur fracture from syncope s/p repair on 7/20.  History of candidal esophagitis currently on fluconazole Also had acute blood loss anemia 11.3>8.7>8.4 suspect related surgery     Latest Ref Rng & Units 02/28/2023    5:43 AM 02/27/2023    4:40 AM 02/26/2023    4:35 AM  CBC  WBC 4.0 - 10.5 K/uL 10.0  10.7  8.3   Hemoglobin 12.0 - 15.0 g/dL 8.4  8.7  78.2   Hematocrit 36.0 - 46.0 % 25.5  26.4  34.3   Platelets 150 - 400 K/uL 299  271  281       02/28/2023    9:52 PM 02/28/2023    3:32 PM 02/28/2023    7:24 AM  Vitals with BMI  Systolic 129 112 956  Diastolic 79 74 80  Pulse 94 87 91     Physical exam Patient resting comfortably, awake and alert Physical Exam Vitals and nursing note reviewed.  Constitutional:      General: She is not in acute distress. HENT:     Head: Normocephalic and atraumatic.  Cardiovascular:     Rate and Rhythm: Normal rate and regular rhythm.     Heart sounds: Normal heart sounds.  Pulmonary:     Effort: Pulmonary effort is normal.     Breath sounds: Normal breath sounds.   Abdominal:     Palpations: Abdomen is soft.     Tenderness: There is no abdominal tenderness.  Neurological:     Mental Status: Mental status is at baseline.      Assessment and  Interventions   Assessment:  Hematemesis, with current candidal esophagitis Acute blood loss anemia--->HB 6.2 S/p recent femur fracture repair   Plan: IV fluid bolus Serial H&H and transfuse if hemoglobin under 7 DC prophylactic enoxaparin and place SCDs Protonix bolus and infusion Keep n.p.o. GI consult Type and cross     Latest Ref Rng & Units 03/01/2023    4:09 AM 02/28/2023   10:29 PM 02/28/2023    5:43 AM  CBC  WBC 4.0 - 10.5 K/uL 10.9   10.0   Hemoglobin 12.0 - 15.0 g/dL 6.2  7.6  8.4   Hematocrit 36.0 - 46.0 % 18.2   25.5   Platelets 150 - 400 K/uL 286   299       02/28/2023   10:41 PM 02/28/2023    9:52 PM 02/28/2023    3:32 PM  Vitals with BMI  Systolic 126 129 213  Diastolic 81 79 74  Pulse 89 94 87    Transfusion of 1 unit PRBC ordered  CRITICAL CARE Performed by: Andris Baumann   Total critical care time: 60 minutes  Critical care time was exclusive of separately billable procedures and treating other patients.  Critical care was necessary to treat or prevent imminent or life-threatening deterioration.  Critical care was time spent personally by me on the following activities: development of treatment plan with patient and/or surrogate as well as nursing, discussions with consultants, evaluation of patient's response to treatment, examination of patient, obtaining history from patient or surrogate, ordering and performing treatments and interventions, ordering and review of laboratory studies, ordering and review of radiographic studies, pulse oximetry and re-evaluation of patient's condition.

## 2023-02-28 NOTE — Plan of Care (Signed)
  Problem: Education: Goal: Knowledge of condition and prescribed therapy will improve Outcome: Progressing   Problem: Cardiac: Goal: Will achieve and/or maintain adequate cardiac output Outcome: Progressing   Problem: Physical Regulation: Goal: Complications related to the disease process, condition or treatment will be avoided or minimized Outcome: Progressing   Problem: Education: Goal: Knowledge of General Education information will improve Description: Including pain rating scale, medication(s)/side effects and non-pharmacologic comfort measures Outcome: Progressing   Problem: Health Behavior/Discharge Planning: Goal: Ability to manage health-related needs will improve Outcome: Progressing   Problem: Clinical Measurements: Goal: Ability to maintain clinical measurements within normal limits will improve Outcome: Progressing Goal: Will remain free from infection Outcome: Progressing Goal: Diagnostic test results will improve Outcome: Progressing Goal: Respiratory complications will improve Outcome: Progressing Goal: Cardiovascular complication will be avoided Outcome: Progressing   Problem: Activity: Goal: Risk for activity intolerance will decrease Outcome: Progressing   Problem: Nutrition: Goal: Adequate nutrition will be maintained Outcome: Progressing   Problem: Coping: Goal: Level of anxiety will decrease Outcome: Progressing   Problem: Elimination: Goal: Will not experience complications related to bowel motility Outcome: Progressing Goal: Will not experience complications related to urinary retention Outcome: Progressing   Problem: Pain Managment: Goal: General experience of comfort will improve Outcome: Progressing   Problem: Safety: Goal: Ability to remain free from injury will improve Outcome: Progressing   Problem: Skin Integrity: Goal: Risk for impaired skin integrity will decrease Outcome: Progressing   Problem: Education: Goal:  Verbalization of understanding the information provided (i.e., activity precautions, restrictions, etc) will improve Outcome: Progressing Goal: Individualized Educational Video(s) Outcome: Progressing   Problem: Activity: Goal: Ability to ambulate and perform ADLs will improve Outcome: Progressing   Problem: Clinical Measurements: Goal: Postoperative complications will be avoided or minimized Outcome: Progressing   Problem: Self-Concept: Goal: Ability to maintain and perform role responsibilities to the fullest extent possible will improve Outcome: Progressing   Problem: Pain Management: Goal: Pain level will decrease Outcome: Progressing

## 2023-02-28 NOTE — Care Management Important Message (Signed)
Important Message  Patient Details  Name: Tara Craig MRN: [id number] Date of Birth: 1961/02/11   Medicare Important Message Given:  Yes     Johnell Comings 02/28/2023, 11:26 AM

## 2023-02-28 NOTE — Progress Notes (Signed)
Subjective: 2 Days Post-Op Procedure(s) (LRB): INTRAMEDULLARY (IM) NAIL INTERTROCHANTERIC (Left) Patient is doing very well today.  She is up and walking around the room when I enter.  Her husband is present.  She wishes to go home.  Hemoglobin is 8.4.  Wound has minimal drainage.  Patient reports pain as mild.  Objective:   VITALS:   Vitals:   02/28/23 0011 02/28/23 0724  BP: 139/76 (!) 143/80  Pulse: 90 91  Resp: 20 16  Temp: 98.1 F (36.7 C) 98 F (36.7 C)  SpO2: 93% 97%    Neurologically intact Intact pulses distally Incision: scant drainage  LABS Recent Labs    02/26/23 0435 02/27/23 0440 02/28/23 0543  HGB 11.3* 8.7* 8.4*  HCT 34.3* 26.4* 25.5*  WBC 8.3 10.7* 10.0  PLT 281 271 299    Recent Labs    02/26/23 0435 02/27/23 0440 02/28/23 0543  NA 140 135 137  K 4.0 3.6 3.7  BUN 21* 11 12  CREATININE 0.74 0.54 0.53  GLUCOSE 93 121* 116*    Recent Labs    02/25/23 1755  INR 1.1     Assessment/Plan: 2 Days Post-Op Procedure(s) (LRB): INTRAMEDULLARY (IM) NAIL INTERTROCHANTERIC (Left)   Advance diet Up with therapy Discharge home with home health 81 mg ASA twice daily for 6 weeks Return to clinic 2 weeks

## 2023-02-28 NOTE — Progress Notes (Signed)
Physical Therapy Treatment Patient Details Name: Tara Craig MRN: [id number] DOB: 1960-10-17 Today's Date: 02/28/2023   History of Present Illness Patient is a 62 year old female who presented to ED for syncopal episode. Patient has been experiencing syncopal episodes for ~1 year. She had an episode prior to going to ED, when she came to she was having severe L hip pain and unable to bear weight. She was found to have L femur fracture and underwent L intramedullary nail intertrochanteric L hip  on 02/26/23. PMH includes HTN, paroxysmal SVT, asthma, opioid dependence on Suboxone, depression, anxiety, HLD, OSA, candida esophagitis.    PT Comments  Patient seen for PM session. Complaining of nausea but agreeable to limited therapy session. Able to complete bed mobility with min guard-A. Stood at bedside with min guard and ambulated within room with RW. Increasing nausea and reporting stomach issues, patient politely declines further mobility. Discharge plan remains appropriate.       Assistance Recommended at Discharge Intermittent Supervision/Assistance  If plan is discharge home, recommend the following:  Can travel by private vehicle    A little help with walking and/or transfers;A lot of help with bathing/dressing/bathroom;Assistance with cooking/housework;Direct supervision/assist for medications management;Help with stairs or ramp for entrance      Equipment Recommendations  Rolling  (2 wheels);BSC/3in1    Recommendations for Other Services       Precautions / Restrictions Precautions Precautions: Fall Precaution Comments: PWB Restrictions Weight Bearing Restrictions: Yes LLE Weight Bearing: Partial weight bearing LLE Partial Weight Bearing Percentage or Pounds: 50     Mobility  Bed Mobility Overal bed mobility: Needs Assistance Bed Mobility: Supine to Sit, Sit to Supine     Supine to sit: Min guard Sit to supine: Min assist   General bed mobility comments:  assist to bring LEs back into bed    Transfers Overall transfer level: Needs assistance Equipment used: Rolling  (2 wheels) Transfers: Sit to/from Stand Sit to Stand: Min guard           General transfer comment: min guard for safety. Cues for hand placement    Ambulation/Gait Ambulation/Gait assistance: Min guard Gait Distance (Feet): 25 Feet Assistive device: Rolling  (2 wheels) Gait Pattern/deviations: Step-to pattern, Decreased stride length, Decreased weight shift to left Gait velocity: decreased     General Gait Details: min guard for safety. limited 2/2 nausea   Stairs Stairs:  (Educated patient on stair negotiation with use of rails and up with good leg and down with bad leg)           Wheelchair Mobility     Tilt Bed    Modified Rankin (Stroke Patients Only)       Balance Overall balance assessment: Needs assistance Sitting-balance support: No upper extremity supported, Feet supported Sitting balance-Leahy Scale: Good     Standing balance support: Bilateral upper extremity supported, During functional activity, Reliant on assistive device for balance Standing balance-Leahy Scale: Poor                              Cognition Arousal/Alertness: Awake/alert Behavior During Therapy: WFL for tasks assessed/performed Overall Cognitive Status: Within Functional Limits for tasks assessed                                          Exercises Other Exercises Other  Exercises: encouraged patient to continue participating in HEP handout provided by previous PT    General Comments        Pertinent Vitals/Pain Pain Assessment Pain Assessment: Faces Faces Pain Scale: Hurts little more Pain Location: L hip Pain Descriptors / Indicators: Discomfort, Grimacing Pain Intervention(s): Monitored during session, Limited activity within patient's tolerance, Repositioned    Home Living                           Prior Function            PT Goals (current goals can now be found in the care plan section) Acute Rehab PT Goals Patient Stated Goal: to return home PT Goal Formulation: With patient Time For Goal Achievement: 03/13/23 Potential to Achieve Goals: Fair Progress towards PT goals: Progressing toward goals    Frequency    BID      PT Plan Current plan remains appropriate    Co-evaluation              AM-PAC PT "6 Clicks" Mobility   Outcome Measure  Help needed turning from your back to your side while in a flat bed without using bedrails?: A Little Help needed moving from lying on your back to sitting on the side of a flat bed without using bedrails?: A Little Help needed moving to and from a bed to a chair (including a wheelchair)?: A Little Help needed standing up from a chair using your arms (e.g., wheelchair or bedside chair)?: A Little Help needed to walk in hospital room?: A Little Help needed climbing 3-5 steps with a railing? : A Lot 6 Click Score: 17    End of Session Equipment Utilized During Treatment: Gait belt Activity Tolerance: Patient tolerated treatment well Patient left: with call bell/phone within reach;in bed Nurse Communication: Mobility status;Weight bearing status PT Visit Diagnosis: Unsteadiness on feet (R26.81);Other abnormalities of gait and mobility (R26.89);Repeated falls (R29.6);Muscle weakness (generalized) (M62.81);History of falling (Z91.81);Difficulty in walking, not elsewhere classified (R26.2);Pain Pain - Right/Left: Left Pain - part of body: Hip     Time: 6578-4696 PT Time Calculation (min) (ACUTE ONLY): 14 min  Charges:    $Gait Training: 8-22 mins PT General Charges $$ ACUTE PT VISIT: 1 Visit                     Maylon Peppers, PT, DPT Physical Therapist - [facility]  [facility]     A  02/28/2023, 3:31 PM

## 2023-02-28 NOTE — Plan of Care (Signed)
  Problem: Education: Goal: Knowledge of condition and prescribed therapy will improve Outcome: Progressing   Problem: Cardiac: Goal: Will achieve and/or maintain adequate cardiac output Outcome: Progressing   Problem: Physical Regulation: Goal: Complications related to the disease process, condition or treatment will be avoided or minimized Outcome: Progressing   Problem: Education: Goal: Knowledge of General Education information will improve Description: Including pain rating scale, medication(s)/side effects and non-pharmacologic comfort measures Outcome: Progressing   Problem: Clinical Measurements: Goal: Will remain free from infection Outcome: Progressing Goal: Respiratory complications will improve Outcome: Progressing   Problem: Pain Managment: Goal: General experience of comfort will improve Outcome: Progressing

## 2023-03-01 DIAGNOSIS — K922 Gastrointestinal hemorrhage, unspecified: Secondary | ICD-10-CM | POA: Diagnosis not present

## 2023-03-01 DIAGNOSIS — S72145A Nondisplaced intertrochanteric fracture of left femur, initial encounter for closed fracture: Secondary | ICD-10-CM | POA: Diagnosis not present

## 2023-03-01 DIAGNOSIS — B3781 Candidal esophagitis: Secondary | ICD-10-CM | POA: Diagnosis not present

## 2023-03-01 DIAGNOSIS — D62 Acute posthemorrhagic anemia: Secondary | ICD-10-CM | POA: Diagnosis not present

## 2023-03-01 DIAGNOSIS — R55 Syncope and collapse: Secondary | ICD-10-CM | POA: Diagnosis not present

## 2023-03-01 HISTORY — DX: Gastrointestinal hemorrhage, unspecified: K92.2

## 2023-03-01 LAB — FERRITIN: Ferritin: 273 ng/mL (ref 11–307)

## 2023-03-01 LAB — BASIC METABOLIC PANEL
Anion gap: 6 (ref 5–15)
BUN: 24 mg/dL — ABNORMAL HIGH (ref 6–20)
CO2: 27 mmol/L (ref 22–32)
Calcium: 7.5 mg/dL — ABNORMAL LOW (ref 8.9–10.3)
Chloride: 104 mmol/L (ref 98–111)
Creatinine, Ser: 0.52 mg/dL (ref 0.44–1.00)
GFR, Estimated: >60 mL/min (ref >60)
Glucose, Bld: 102 mg/dL — ABNORMAL HIGH (ref 70–99)
Potassium: 3.8 mmol/L (ref 3.5–5.1)
Sodium: 137 mmol/L (ref 135–145)

## 2023-03-01 LAB — IRON AND TIBC
Iron: 27 ug/dL — ABNORMAL LOW (ref 28–170)
Saturation Ratios: 17 % (ref 10.4–31.8)
TIBC: 161 ug/dL — ABNORMAL LOW (ref 250–450)
UIBC: 134 ug/dL

## 2023-03-01 LAB — BPAM RBC: ISSUE DATE / TIME: 202407230811

## 2023-03-01 LAB — TYPE AND SCREEN

## 2023-03-01 LAB — HEMOGLOBIN: Hemoglobin: 8.4 g/dL — ABNORMAL LOW (ref 12.0–15.0)

## 2023-03-01 LAB — CBC
HCT: 18.2 % — ABNORMAL LOW (ref 36.0–46.0)
Hemoglobin: 6.2 g/dL — ABNORMAL LOW (ref 12.0–15.0)
MCH: 30.5 pg (ref 26.0–34.0)
MCHC: 34.1 g/dL (ref 30.0–36.0)
MCV: 89.7 fL (ref 80.0–100.0)
Platelets: 286 10*3/uL (ref 150–400)
RBC: 2.03 MIL/uL — ABNORMAL LOW (ref 3.87–5.11)
RDW: 11.9 % (ref 11.5–15.5)
WBC: 10.9 10*3/uL — ABNORMAL HIGH (ref 4.0–10.5)
nRBC: 0 % (ref 0.0–0.2)

## 2023-03-01 LAB — GLUCOSE, CAPILLARY: Glucose-Capillary: 84 mg/dL (ref 70–99)

## 2023-03-01 LAB — PREPARE RBC (CROSSMATCH)

## 2023-03-01 LAB — FOLATE: Folate: 19.2 ng/mL (ref >5.9)

## 2023-03-01 MED ORDER — SODIUM CHLORIDE 0.9% IV SOLUTION: Freq: Once | INTRAVENOUS | Status: AC

## 2023-03-01 MED ORDER — SODIUM CHLORIDE 0.9 % IV BOLUS
500.0000 mL | Freq: Once | INTRAVENOUS | Status: AC
  Administered 2023-03-01: 500 mL via INTRAVENOUS

## 2023-03-01 MED ORDER — MORPHINE SULFATE (PF) 2 MG/ML IV SOLN
1.0000 mg | INTRAVENOUS | Status: DC | PRN
  Administered 2023-03-01 – 2023-03-02 (×3): 1 mg via INTRAVENOUS
  Filled 2023-03-01 (×3): qty 1

## 2023-03-01 NOTE — Progress Notes (Signed)
Physical Therapy Treatment Patient Details Name: Tara Craig MRN: [id number] DOB: 1961-06-12 Today's Date: 03/01/2023   History of Present Illness Patient is a 62 year old female who presented to ED for syncopal episode. Patient has been experiencing syncopal episodes for ~1 year. She had an episode prior to going to ED, when she came to she was having severe L hip pain and unable to bear weight. She was found to have L femur fracture and underwent L intramedullary nail intertrochanteric L hip  on 02/26/23. PMH includes HTN, paroxysmal SVT, asthma, opioid dependence on Suboxone, depression, anxiety, HLD, OSA, candida esophagitis.    PT Comments  Cleared by RN to see patient post transfusion. Patient complaining of mild dizziness with mobility. Ambulated to/from bathroom with RW and min guard for safety. Complaining of increased pain and stiffness in LLE from lack of mobility this AM. Eager to discharge home but educated patient on need for continued workup per MD recommendation. Discharge plan remains appropriate.      Assistance Recommended at Discharge Intermittent Supervision/Assistance  If plan is discharge home, recommend the following:  Can travel by private vehicle    A little help with walking and/or transfers;A lot of help with bathing/dressing/bathroom;Assistance with cooking/housework;Direct supervision/assist for medications management;Help with stairs or ramp for entrance   Yes  Equipment Recommendations  Rolling  (2 wheels);BSC/3in1    Recommendations for Other Services       Precautions / Restrictions Precautions Precautions: Fall Precaution Comments: PWB Restrictions Weight Bearing Restrictions: Yes LLE Weight Bearing: Partial weight bearing LLE Partial Weight Bearing Percentage or Pounds: 50     Mobility  Bed Mobility Overal bed mobility: Needs Assistance Bed Mobility: Sit to Supine       Sit to supine: Min assist   General bed mobility comments:  assist for bringing LEs back into bed. Sitting on EOB on arrival    Transfers Overall transfer level: Needs assistance Equipment used: Rolling  (2 wheels) Transfers: Sit to/from Stand Sit to Stand: Supervision                Ambulation/Gait Ambulation/Gait assistance: Min guard Gait Distance (Feet): 12 Feet (+12') Assistive device: Rolling  (2 wheels) Gait Pattern/deviations: Step-to pattern, Decreased stride length, Decreased weight shift to left Gait velocity: decreased     General Gait Details: limited ambulation 2/2 dizziness and faituge from possible low hgb   Stairs             Wheelchair Mobility     Tilt Bed    Modified Rankin (Stroke Patients Only)       Balance Overall balance assessment: Needs assistance Sitting-balance support: No upper extremity supported, Feet supported Sitting balance-Leahy Scale: Good     Standing balance support: Bilateral upper extremity supported, During functional activity, Reliant on assistive device for balance Standing balance-Leahy Scale: Poor                              Cognition Arousal/Alertness: Awake/alert Behavior During Therapy: WFL for tasks assessed/performed Overall Cognitive Status: Within Functional Limits for tasks assessed                                          Exercises      General Comments General comments (skin integrity, edema, etc.): Per RN, cleared to see patient with improved BP  and post transfusion      Pertinent Vitals/Pain Pain Assessment Pain Assessment: Faces Faces Pain Scale: Hurts even more Pain Location: L hip Pain Descriptors / Indicators: Discomfort, Grimacing Pain Intervention(s): Limited activity within patient's tolerance, Monitored during session, Repositioned    Home Living                          Prior Function            PT Goals (current goals can now be found in the care plan section) Acute Rehab  PT Goals Patient Stated Goal: to return home PT Goal Formulation: With patient Time For Goal Achievement: 03/13/23 Potential to Achieve Goals: Good Progress towards PT goals: Progressing toward goals    Frequency    BID      PT Plan Current plan remains appropriate    Co-evaluation              AM-PAC PT "6 Clicks" Mobility   Outcome Measure  Help needed turning from your back to your side while in a flat bed without using bedrails?: A Little Help needed moving from lying on your back to sitting on the side of a flat bed without using bedrails?: A Little Help needed moving to and from a bed to a chair (including a wheelchair)?: A Little Help needed standing up from a chair using your arms (e.g., wheelchair or bedside chair)?: A Little Help needed to walk in hospital room?: A Little Help needed climbing 3-5 steps with a railing? : A Lot 6 Click Score: 17    End of Session   Activity Tolerance: Patient tolerated treatment well Patient left: in bed;with call bell/phone within reach Nurse Communication: Mobility status;Weight bearing status PT Visit Diagnosis: Unsteadiness on feet (R26.81);Other abnormalities of gait and mobility (R26.89);Repeated falls (R29.6);Muscle weakness (generalized) (M62.81);History of falling (Z91.81);Difficulty in walking, not elsewhere classified (R26.2);Pain Pain - Right/Left: Left Pain - part of body: Hip     Time: 1345-1401 PT Time Calculation (min) (ACUTE ONLY): 16 min  Charges:    $Therapeutic Activity: 8-22 mins PT General Charges $$ ACUTE PT VISIT: 1 Visit                     Maylon Peppers, PT, DPT Physical Therapist - [facility]  [facility]     A  03/01/2023, 2:11 PM

## 2023-03-01 NOTE — Plan of Care (Signed)
  Problem: Education: Goal: Knowledge of condition and prescribed therapy will improve Outcome: Progressing   Problem: Cardiac: Goal: Will achieve and/or maintain adequate cardiac output Outcome: Progressing   Problem: Physical Regulation: Goal: Complications related to the disease process, condition or treatment will be avoided or minimized Outcome: Progressing   Problem: Education: Goal: Knowledge of General Education information will improve Description: Including pain rating scale, medication(s)/side effects and non-pharmacologic comfort measures Outcome: Progressing   Problem: Health Behavior/Discharge Planning: Goal: Ability to manage health-related needs will improve Outcome: Progressing   Problem: Clinical Measurements: Goal: Ability to maintain clinical measurements within normal limits will improve Outcome: Progressing Goal: Will remain free from infection Outcome: Progressing Goal: Diagnostic test results will improve Outcome: Progressing Goal: Respiratory complications will improve Outcome: Progressing Goal: Cardiovascular complication will be avoided Outcome: Progressing   Problem: Activity: Goal: Risk for activity intolerance will decrease Outcome: Progressing   Problem: Nutrition: Goal: Adequate nutrition will be maintained Outcome: Progressing   Problem: Coping: Goal: Level of anxiety will decrease Outcome: Progressing   Problem: Elimination: Goal: Will not experience complications related to bowel motility Outcome: Progressing Goal: Will not experience complications related to urinary retention Outcome: Progressing   Problem: Pain Managment: Goal: General experience of comfort will improve Outcome: Progressing   Problem: Safety: Goal: Ability to remain free from injury will improve Outcome: Progressing   Problem: Skin Integrity: Goal: Risk for impaired skin integrity will decrease Outcome: Progressing   Problem: Education: Goal:  Verbalization of understanding the information provided (i.e., activity precautions, restrictions, etc) will improve Outcome: Progressing Goal: Individualized Educational Video(s) Outcome: Progressing   Problem: Activity: Goal: Ability to ambulate and perform ADLs will improve Outcome: Progressing   Problem: Clinical Measurements: Goal: Postoperative complications will be avoided or minimized Outcome: Progressing   Problem: Self-Concept: Goal: Ability to maintain and perform role responsibilities to the fullest extent possible will improve Outcome: Progressing   Problem: Pain Management: Goal: Pain level will decrease Outcome: Progressing

## 2023-03-01 NOTE — Consult Note (Signed)
Tara Repress, MD [address]  [address]  [place], [place] [address]  Main: [phone]  Fax: [phone] Pager: [phone]   Consultation  Referring Provider:     No ref. provider found Primary Care Physician:  [name], MD Primary Gastroenterologist: [facility]        Reason for Consultation: Hematemesis, acute on chronic anemia  Date of Admission:  02/25/2023 Date of Consultation:  03/01/2023         HPI:   Tara Craig is a 62 y.o. female with history of syncopal episodes, paroxysmal SVT, depression, anxiety, anemia is admitted to [facility] after a syncopal episode resulting in left hip fracture.  She underwent intramedullary nail intertrochanteric left hip on 02/26/2023.  Overnight, she had an episode of hematemesis resulting in drop in hemoglobin from 7.6 to 6.2.  She received 1 unit of PRBCs and responded appropriately.  Patient denies any epigastric pain, nausea or vomiting.  She did not have any further episodes.  She underwent EGD at [facility] on 02/07/2023, found to have Candida esophagitis as well as gastrojejunal anastomotic ulcer with no evidence of active GI bleed.  Patient has been on outpatient PPI.  She is currently on pantoprazole drip since hematemesis along with treatment for Candida esophagitis. Elevated BUN/creatinine  NSAIDs: None  Antiplts/Anticoagulants/Anti thrombotics: None  GI Procedures:  Upper endoscopy at [facility] 02/07/2023 - Severe candidiasis esophagitis with no bleeding.                         Biopsied.                         - Gastric bypass with a normal-sized pouch and intact                         staple line. Gastrojejunal anastomosis characterized                         by ulceration.                         - No gross lesions in the cardia, in the gastric                         fundus and in the gastric body. Biopsied.   Past Medical History:  Diagnosis Date   Arthritis    Asthma    Back pain, chronic    Bilateral  foot-drop    Skin cancer     Past Surgical History:  Procedure Laterality Date   ABDOMINAL HYSTERECTOMY     APPENDECTOMY     BACK SURGERY  2010   INTRAMEDULLARY (IM) NAIL INTERTROCHANTERIC Left 02/26/2023   Procedure: INTRAMEDULLARY (IM) NAIL INTERTROCHANTERIC;  Surgeon: Deeann Saint, MD;  Location: [facility] ORS;  Service: Orthopedics;  Laterality: Left;   TENNIS ELBOW RELEASE/NIRSCHEL PROCEDURE       Current Facility-Administered Medications:    [DISCONTINUED] acetaminophen (TYLENOL) tablet 650 mg, 650 mg, Oral, Q6H PRN **OR** acetaminophen (TYLENOL) suppository 650 mg, 650 mg, Rectal, Q6H PRN, Deeann Saint, MD   acetaminophen (TYLENOL) tablet 325-650 mg, 325-650 mg, Oral, Q6H PRN, Deeann Saint, MD   alum & mag hydroxide-simeth (MAALOX/MYLANTA) 200-200-20 MG/5ML suspension 30 mL, 30 mL, Oral, Q4H PRN, Deeann Saint, MD   bisacodyl (DULCOLAX) suppository 10 mg,  10 mg, Rectal, Daily PRN, Deeann Saint, MD, 10 mg at 02/28/23 2128   buprenorphine-naloxone (SUBOXONE) 2-0.5 mg per SL tablet 4 tablet, 4 tablet, Sublingual, TID, Drusilla Kanner, RPH, 4 tablet at 03/01/23 0900   busPIRone (BUSPAR) tablet 15 mg, 15 mg, Oral, BID, Deeann Saint, MD, 15 mg at 02/28/23 9528   celecoxib (CELEBREX) capsule 100 mg, 100 mg, Oral, BID, Deeann Saint, MD, 100 mg at 02/28/23 4132   Chlorhexidine Gluconate Cloth 2 % PADS 6 each, 6 each, Topical, Daily, Lurene Shadow, MD, 6 each at 03/01/23 0902   ferrous sulfate tablet 325 mg, 325 mg, Oral, Q breakfast, Deeann Saint, MD, 325 mg at 02/28/23 4401   fluconazole (DIFLUCAN) tablet 100 mg, 100 mg, Oral, Daily, Foye Deer, RPH, 100 mg at 02/28/23 0815   gabapentin (NEURONTIN) capsule 600 mg, 600 mg, Oral, BID, Deeann Saint, MD, 600 mg at 02/28/23 0272   HYDROcodone-acetaminophen (NORCO) 7.5-325 MG per tablet 1-2 tablet, 1-2 tablet, Oral, Q4H PRN, Deeann Saint, MD, 2 tablet at 02/27/23 1552   HYDROcodone-acetaminophen (NORCO/VICODIN) 5-325 MG per  tablet 1-2 tablet, 1-2 tablet, Oral, Q4H PRN, Deeann Saint, MD, 2 tablet at 02/28/23 1551   magnesium hydroxide (MILK OF MAGNESIA) suspension 30 mL, 30 mL, Oral, Daily PRN, Deeann Saint, MD   menthol-cetylpyridinium (CEPACOL) lozenge 3 mg, 1 lozenge, Oral, PRN **OR** phenol (CHLORASEPTIC) mouth spray 1 spray, 1 spray, Mouth/Throat, PRN, Deeann Saint, MD   metoCLOPramide (REGLAN) tablet 5-10 mg, 5-10 mg, Oral, Q8H PRN **OR** metoCLOPramide (REGLAN) injection 5-10 mg, 5-10 mg, Intravenous, Q8H PRN, Deeann Saint, MD, 10 mg at 03/01/23 0741   morphine (PF) 2 MG/ML injection 1 mg, 1 mg, Intravenous, Q2H PRN, Lindajo Royal V, MD, 1 mg at 03/01/23 1335   morphine (PF) 4 MG/ML injection 4 mg, 4 mg, Intravenous, Q4H PRN, Lurene Shadow, MD, 4 mg at 03/01/23 0740   mupirocin ointment (BACTROBAN) 2 % 1 Application, 1 Application, Nasal, BID, Lurene Shadow, MD, 1 Application at 03/01/23 0900   nicotine (NICODERM CQ - dosed in mg/24 hours) patch 14 mg, 14 mg, Transdermal, Daily PRN, Deeann Saint, MD   ondansetron [facility]) tablet 4 mg, 4 mg, Oral, Q6H PRN **OR** ondansetron (ZOFRAN) injection 4 mg, 4 mg, Intravenous, Q6H PRN, Deeann Saint, MD, 4 mg at 02/28/23 2118   [START ON 03/04/2023] pantoprazole (PROTONIX) injection 40 mg, 40 mg, Intravenous, Q12H, Andris Baumann, MD   pantoprozole (PROTONIX) 80 mg /NS 100 mL infusion, 8 mg/hr, Intravenous, Continuous, Lindajo Royal V, MD, Last Rate: 10 mL/hr at 03/01/23 0034, 8 mg/hr at 03/01/23 0034   polyethylene glycol (MIRALAX / GLYCOLAX) packet 17 g, 17 g, Oral, Daily PRN, Deeann Saint, MD   senna [facility]) tablet 8.6 mg, 1 tablet, Oral, BID, Deeann Saint, MD, 8.6 mg at 02/28/23 5366   sodium phosphate (FLEET) 7-19 GM/118ML enema 1 enema, 1 enema, Rectal, Once PRN, Deeann Saint, MD   venlafaxine XR [facility]) 24 hr capsule 150 mg, 150 mg, Oral, Q breakfast, Deeann Saint, MD, 150 mg at 02/28/23 4403   Family History  Problem Relation Age of  Onset   Cancer Mother        lung   Heart disease Father      Social History   Tobacco Use   Smoking status: Every Day    Current packs/day: 1.50    Types: Cigarettes   Smokeless tobacco: Never  Vaping Use   Vaping status: Unknown  Substance Use Topics   Alcohol use: No  Drug use: No    Allergies as of 02/25/2023 - Review Complete 02/25/2023  Allergen Reaction Noted   Butrans [buprenorphine] Other (See Comments) 02/25/2023   Pumpkin flavor  10/13/2018   Tramadol Nausea And Vomiting 09/26/2015    Review of Systems:    All systems reviewed and negative except where noted in HPI.   Physical Exam:  Vital signs in last 24 hours: Temp:  [98.3 F (36.8 C)-99.5 F (37.5 C)] 99.3 F (37.4 C) (07/23 1057) Pulse Rate:  [89-102] 102 (07/23 1328) Resp:  [16-18] 17 (07/23 1057) BP: (86-129)/(55-81) 114/73 (07/23 1328) SpO2:  [96 %-98 %] 98 % (07/23 1057) Last BM Date : (P) 02/28/23 General:   Pleasant, cooperative in NAD, thin built Head:  Normocephalic and atraumatic. Eyes:   No icterus.   Conjunctiva pale. PERRLA. Ears:  Normal auditory acuity. Neck:  Supple; no masses or thyroidomegaly Lungs: Respirations even and unlabored. Lungs clear to auscultation bilaterally.   No wheezes, crackles, or rhonchi.  Heart:  Regular rate and rhythm;  Without murmur, clicks, rubs or gallops Abdomen:  Soft, nondistended, nontender. Normal bowel sounds. No appreciable masses or hepatomegaly.  No rebound or guarding.  Rectal:  Not performed. Msk:  Symmetrical without gross deformities.  Strength generalized weakness Extremities:  Without edema, cyanosis or clubbing. Neurologic:  Alert and oriented x3;  grossly normal neurologically. Skin:  Intact without significant lesions or rashes. Psych:  Alert and cooperative. Normal affect.  LAB RESULTS:    Latest Ref Rng & Units 03/01/2023   12:55 PM 03/01/2023    4:09 AM 02/28/2023   10:29 PM  CBC  WBC 4.0 - 10.5 K/uL  10.9    Hemoglobin 12.0 -  15.0 g/dL 8.4  6.2  7.6   Hematocrit 36.0 - 46.0 %  18.2    Platelets 150 - 400 K/uL  286      BMET    Latest Ref Rng & Units 03/01/2023    4:09 AM 02/28/2023    5:43 AM 02/27/2023    4:40 AM  BMP  Glucose 70 - 99 mg/dL 130  865  784   BUN 6 - 20 mg/dL 24  12  11    Creatinine 0.44 - 1.00 mg/dL 6.96  2.95  2.84   Sodium 135 - 145 mmol/L 137  137  135   Potassium 3.5 - 5.1 mmol/L 3.8  3.7  3.6   Chloride 98 - 111 mmol/L 104  102  102   CO2 22 - 32 mmol/L 27  28  25    Calcium 8.9 - 10.3 mg/dL 7.5  8.0  7.9     LFT    Latest Ref Rng & Units 02/26/2023    4:35 AM 10/13/2018   10:38 PM 10/17/2017    8:46 AM  Hepatic Function  Total Protein 6.5 - 8.1 g/dL 5.3  6.9  7.3   Albumin 3.5 - 5.0 g/dL 2.9  3.8  3.0   AST 15 - 41 U/L 16  32  15   ALT 0 - 44 U/L 17  19  15    Alk Phosphatase 38 - 126 U/L 54  78  77   Total Bilirubin 0.3 - 1.2 mg/dL 0.3  0.5  0.7      STUDIES: No results found.    Impression / Plan:   Tara Craig is a 62 y.o. female with history of left hip fracture after a syncopal episode, s/p intramedullary nail placement on 02/26/2023, history of gastric bypass,  gastrojejunal anastomotic ulcer and Candida esophagitis based on EGD on 02/07/2023 at [facility].  Hospital course complicated by an episode of hematemesis resulting in acute blood loss anemia  Acute upper GI bleed with known history of Candida esophagitis as well as gastrojejunal anastomotic ulcer Continue pantoprazole drip Hemoglobin responded appropriately to 1 unit of PRBCs No further episodes of hematemesis since last night Monitor CBC closely to maintain hemoglobin above 7 Check iron panel, B12 and folate levels Recommend EGD for further evaluation, will plan for tomorrow N.p.o. effective 5 AM tomorrow Continue clear liquid diet  I have discussed alternative options, risks & benefits,  which include, but are not limited to, bleeding, infection, perforation,respiratory complication & drug reaction.  The  patient agrees with this plan & written consent will be obtained.     Thank you for involving me in the care of this patient.      LOS: 4 days   Lannette Donath, MD  03/01/2023, 3:58 PM    Note: This dictation was prepared with Dragon dictation along with smaller phrase technology. Any transcriptional errors that result from this process are unintentional.

## 2023-03-01 NOTE — Progress Notes (Signed)
PT Cancellation Note  Patient Details Name: CHERLYNN POPIEL MRN: [id number] DOB: 03/13/1961   Cancelled Treatment:    Reason Eval/Treat Not Completed: Medical issues which prohibited therapy Patient with Hgb 6.2 and soft BP this AM. Will hold until medically appropriate and post-transfusion as vitals improve.   Maylon Peppers, PT, DPT Physical Therapist - [facility]     A  03/01/2023, 9:07 AM

## 2023-03-01 NOTE — Progress Notes (Signed)
Patient is not able to walk the distance required to go the bathroom, or he/she is unable to safely negotiate stairs required to access the bathroom.  A 3in1 BSC will alleviate this problem  

## 2023-03-01 NOTE — Progress Notes (Addendum)
Progress Note    Tara Craig  WGN:[id number] DOB: 04-Apr-1961  DOA: 02/25/2023 PCP: [name], MD      Brief Narrative:    Medical records reviewed and are as summarized below:  Tara Craig is a 62 y.o. female with medical history significant for recurrent "blackout" episodes been evaluated by physicians at [facility], hypertension, paroxysmal SVT, mild intermittent asthma, opioid dependence on Suboxone, depression, anxiety, hyperlipidemia, recent diagnosis of mild OSA, recently diagnosed candida esophagitis, recently diagnosed right upper lung nodule suspected to be lung cancer.  He said she has had recurrent episodes of passing out, which she describes as blackouts, for about a year.  She presented to the hospital severe pain in the left hip after she had one of those syncopal episodes.  She was found to have left hip fracture.       Assessment/Plan:   Principal Problem:   Syncope Active Problems:   Femur fracture (HCC)   Paroxysmal SVT (supraventricular tachycardia)   Opioid dependence on maintenance agonist therapy, no symptoms (HCC)   Essential hypertension   Candida esophagitis (HCC)   Anxiety and depression   Lung nodule   Mild intermittent asthma without complication   Acute upper GI bleeding   Acute blood loss anemia    Body mass index is 24.54 kg/m.   S/p syncope Patient reports she has been having syncopal episodes for 1 year now and has followed up with her PCP.  Unclear etiology.  Echocardiogram in October 2022 demonstrated normal LV and RV size and function.  History of paroxysmal SVTs.   HCTZ and chlorthalidone have been held 2D echo showed EF estimated at 55 to 60%.    Left intertrochanteric femur fracture (HCC) S/p intramedullary nail intertrochanteric left hip on 02/26/2023. Analgesics as needed for pain.  Follow-up with orthopedic surgeon.  PT recommended discharge to SNF   Acute blood loss anemia Hemoglobin dropped  from 11.6-8.7-8.4-7.6-6.2. Transfuse 1 unit of packed red blood cells.  Monitor H&H.   Hematemesis/acute upper GI bleeding Continue IV Protonix. Case discussed with Dr. Allegra Lai, gastroenterologist.  Molli Knock with clear liquid diet because she cannot do EGD today. Hold Lovenox for now   Hypotension Discontinue lisinopril and monitor BP    Paroxysmal SVT (supraventricular tachycardia) EKG showed sinus tachycardia on admission    Opioid dependence on maintenance agonist therapy, no symptoms (HCC) - Continue home Suboxone    Candida esophagitis (HCC) Chart review showed recent EGD on 02/07/2023.  She was prescribed a 3-week course of fluconazole and she has taken 2 weeks worth of fluconazole thus far. Continue fluconazole through 03/04/2023     Lung nodule Patient recently had a CT of the chest that demonstrated a new spiculated nodule of the right upper lobe concerning for primary malignancy.  She has been referred to oncology and has a PET scan scheduled on 03/11/2023 and follow-up with oncologist on 03/15/2023.    Other comorbidities include anxiety, hypertension, depression, mild intermittent asthma, tobacco use disorder    Diet Order             Diet clear liquid Fluid consistency: Thin  Diet effective now                            Consultants: Orthopedic surgeon  Procedures: Intramedullary nail intertrochanteric left hip    Medications:    buprenorphine-naloxone  4 tablet Sublingual TID   busPIRone  15 mg  Oral BID   celecoxib  100 mg Oral BID   Chlorhexidine Gluconate Cloth  6 each Topical Daily   ferrous sulfate  325 mg Oral Q breakfast   fluconazole  100 mg Oral Daily   gabapentin  600 mg Oral BID   mupirocin ointment  1 Application Nasal BID   [START ON 03/04/2023] pantoprazole  40 mg Intravenous Q12H   senna  1 tablet Oral BID   venlafaxine XR  150 mg Oral Q breakfast   Continuous Infusions:  pantoprazole 8 mg/hr (03/01/23 0034)       Anti-infectives (From admission, onward)    Start     Dose/Rate Route Frequency Ordered Stop   02/26/23 2200  fluconazole (DIFLUCAN) tablet 100 mg        100 mg Oral Daily 02/26/23 1821 03/05/23 0959   02/26/23 1600  fluconazole (DIFLUCAN) tablet 100 mg  Status:  Discontinued        100 mg Oral Daily 02/26/23 1514 02/26/23 1820   02/26/23 1215  vancomycin (VANCOCIN) IVPB 1000 mg/200 mL premix        1,000 mg 200 mL/hr over 60 Minutes Intravenous Every 12 hours 02/26/23 1214 02/27/23 1255   02/26/23 1132  gentamicin 80 mg in 0.9% sodium chloride 250 mL irrigation  Status:  Discontinued          As needed 02/26/23 1132 02/26/23 1159   02/26/23 1000  ceFAZolin (ANCEF) IVPB 2g/100 mL premix        2 g 200 mL/hr over 30 Minutes Intravenous 30 min pre-op 02/25/23 1736 02/26/23 1105              Family Communication/Anticipated D/C date and plan/Code Status   DVT prophylaxis: Place and maintain sequential compression device Start: 02/28/23 2209 SCDs Start: 02/26/23 1355 SCDs Start: 02/25/23 1918     Code Status: Full Code  Family Communication: None Disposition Plan: Plan to discharge home with home health therapy   Status is: Inpatient Remains inpatient appropriate because: S/p left hip surgery       Subjective:   Interval events noted.  She vomited blood last night.  No abdominal pain or melena.  Objective:    Vitals:   03/01/23 0700 03/01/23 0822 03/01/23 0835 03/01/23 0853  BP: (!) 88/61 (!) 86/57 (!) 86/57 (!) 88/55  Pulse: 93 94 94 96  Resp: 17 17 16 16   Temp: 99.3 F (37.4 C) 99.3 F (37.4 C) 99.3 F (37.4 C) 99.5 F (37.5 C)  TempSrc: Oral  Oral Oral  SpO2: 98% 98% 98% 98%  Weight:      Height:       No data found.  No intake or output data in the 24 hours ending 03/01/23 1226  Filed Weights   02/25/23 1618 02/25/23 2036 02/26/23 0748  Weight: 57.2 kg 57 kg 57 kg    Exam:   GEN: NAD SKIN: Warm and dry EYES: Pale but  anicteric ENT: MMM CV: RRR PULM: CTA B ABD: soft, ND, NT, +BS CNS: AAO x 3, non focal EXT: Dressing on left hip surgical dressing.  No active bleeding.        Data Reviewed:   I have personally reviewed following labs and imaging studies:  Labs: Labs show the following:   Basic Metabolic Panel: Recent Labs  Lab 02/25/23 1625 02/26/23 0435 02/27/23 0440 02/28/23 0543 03/01/23 0409  NA 136 140 135 137 137  K 3.9 4.0 3.6 3.7 3.8  CL 101 106 102 102  104  CO2 27 29 25 28 27   GLUCOSE 83 93 121* 116* 102*  BUN 26* 21* 11 12 24*  CREATININE 0.75 0.74 0.54 0.53 0.52  CALCIUM 8.4* 8.3* 7.9* 8.0* 7.5*  MG 2.4  --   --   --   --   PHOS 4.1  --   --   --   --    GFR Estimated Creatinine Clearance: 59.1 mL/min (by C-G formula based on SCr of 0.52 mg/dL). Liver Function Tests: Recent Labs  Lab 02/26/23 0435  AST 16  ALT 17  ALKPHOS 54  BILITOT 0.3  PROT 5.3*  ALBUMIN 2.9*   No results for input(s): "LIPASE", "AMYLASE" in the last 168 hours. No results for input(s): "AMMONIA" in the last 168 hours. Coagulation profile Recent Labs  Lab 02/25/23 1755  INR 1.1    CBC: Recent Labs  Lab 02/25/23 1625 02/26/23 0435 02/27/23 0440 02/28/23 0543 02/28/23 2229 03/01/23 0409  WBC 10.2 8.3 10.7* 10.0  --  10.9*  HGB 11.6* 11.3* 8.7* 8.4* 7.6* 6.2*  HCT 36.3 34.3* 26.4* 25.5*  --  18.2*  MCV 93.8 91.7 90.7 90.4  --  89.7  PLT 300 281 271 299  --  286   Cardiac Enzymes: No results for input(s): "CKTOTAL", "CKMB", "CKMBINDEX", "TROPONINI" in the last 168 hours. BNP (last 3 results) No results for input(s): "PROBNP" in the last 8760 hours. CBG: Recent Labs  Lab 02/28/23 0802 03/01/23 0812  GLUCAP 117* 84   D-Dimer: No results for input(s): "DDIMER" in the last 72 hours. Hgb A1c: No results for input(s): "HGBA1C" in the last 72 hours. Lipid Profile: No results for input(s): "CHOL", "HDL", "LDLCALC", "TRIG", "CHOLHDL", "LDLDIRECT" in the last 72  hours. Thyroid function studies: No results for input(s): "TSH", "T4TOTAL", "T3FREE", "THYROIDAB" in the last 72 hours.  Invalid input(s): "FREET3"  Anemia work up: No results for input(s): "VITAMINB12", "FOLATE", "FERRITIN", "TIBC", "IRON", "RETICCTPCT" in the last 72 hours. Sepsis Labs: Recent Labs  Lab 02/26/23 0435 02/27/23 0440 02/28/23 0543 03/01/23 0409  WBC 8.3 10.7* 10.0 10.9*    Microbiology Recent Results (from the past 240 hour(s))  Surgical pcr screen     Status: Abnormal   Collection Time: 02/26/23  7:45 AM   Specimen: Nasal Mucosa; Nasal Swab  Result Value Ref Range Status   MRSA, PCR NEGATIVE NEGATIVE Final   Staphylococcus aureus POSITIVE (A) NEGATIVE Final    Comment: (NOTE) The Xpert SA Assay (FDA approved for NASAL specimens in patients 71 years of age and older), is one component of a comprehensive surveillance program. It is not intended to diagnose infection nor to guide or monitor treatment. Performed at [facility], [address] [place], [place] [address]     Procedures and diagnostic studies:  No results found.             LOS: 4 days      [facility]   Pager on [email or web address]. If 7PM-7AM, please contact night-coverage at [email or web address]     03/01/2023, 12:26 PM

## 2023-03-01 NOTE — Plan of Care (Signed)
  Problem: Education: Goal: Knowledge of condition and prescribed therapy will improve Outcome: Progressing   Problem: Cardiac: Goal: Will achieve and/or maintain adequate cardiac output Outcome: Progressing   Problem: Physical Regulation: Goal: Complications related to the disease process, condition or treatment will be avoided or minimized Outcome: Progressing   Problem: Education: Goal: Knowledge of General Education information will improve Description: Including pain rating scale, medication(s)/side effects and non-pharmacologic comfort measures Outcome: Progressing   Problem: Clinical Measurements: Goal: Will remain free from infection Outcome: Progressing   Problem: Coping: Goal: Level of anxiety will decrease Outcome: Progressing   Problem: Elimination: Goal: Will not experience complications related to urinary retention Outcome: Progressing   Problem: Pain Managment: Goal: General experience of comfort will improve Outcome: Progressing   Problem: Safety: Goal: Ability to remain free from injury will improve Outcome: Progressing   Problem: Elimination: Goal: Will not experience complications related to bowel motility Outcome: Not Progressing

## 2023-03-01 NOTE — Progress Notes (Signed)
Subjective: 3 Days Post-Op Procedure(s) (LRB): INTRAMEDULLARY (IM) NAIL INTERTROCHANTERIC (Left) The patient looks and apparently feels very overall well had bloody emesis yesterday.  Her hemoglobin has dropped to 6.2 and she received 1 unit of packed cells already today.  She may need an endoscopy.  Dressing continues to show very slight direct drainage and will be changed today.  She has been ambulatory without problems and pain is better.  Patient reports pain as mild.  Objective:   VITALS:   Vitals:   03/01/23 0835 03/01/23 0853  BP: (!) 86/57 (!) 88/55  Pulse: 94 96  Resp: 16 16  Temp: 99.3 F (37.4 C) 99.5 F (37.5 C)  SpO2: 98% 98%    Neurologically intact Sensation intact distally Intact pulses distally Dorsiflexion/Plantar flexion intact Incision: scant drainage  LABS Recent Labs    02/27/23 0440 02/28/23 0543 02/28/23 2229 03/01/23 0409  HGB 8.7* 8.4* 7.6* 6.2*  HCT 26.4* 25.5*  --  18.2*  WBC 10.7* 10.0  --  10.9*  PLT 271 299  --  286    Recent Labs    02/27/23 0440 02/28/23 0543 03/01/23 0409  NA 135 137 137  K 3.6 3.7 3.8  BUN 11 12 24*  CREATININE 0.54 0.53 0.52  GLUCOSE 121* 116* 102*    No results for input(s): "LABPT", "INR" in the last 72 hours.   Assessment/Plan: 3 Days Post-Op Procedure(s) (LRB): INTRAMEDULLARY (IM) NAIL INTERTROCHANTERIC (Left)   Up with therapy Discharge home with home health when medically stabilized. DVT prophylaxis will depend on her bleeding scenario and will be left up to medicine. Return to office in 2 weeks for exam and x-ray.

## 2023-03-01 NOTE — TOC Transition Note (Signed)
Transition of Care [facility]) - CM/SW Discharge Note   Patient Details  Name: Tara Craig MRN: [id number] Date of Birth: 08/03/1961  Transition of [facility]) CM/SW Contact:  Garret Reddish, RN Phone Number: 03/01/2023, 9:43 AM   Clinical Narrative:   Chart reviewed.  Noted that patient was admitted with Syncope which resulted in Left hip Fracture.  Noted that patient had INTRAMEDULLARY (IM) NAIL INTERTROCHANTERIC left hip  On 02-26-2023.    I have meet with patient at bedside on yesterday.  Patient reported that prior to admission she lived at home with her husband and her sister and her husband.  Patient reports that prior to admission she was independent of ADLs.  She reports that she is being followed by [facility] for  having syncopal episodes for 1 year.  She reports that the episodes only have happened at night at this point.  She was able to drive prior to admission.  Patient reports that she has been having nausea most of the day.     I have spoken with patient about the need for home health and discharge.  Patient did not have a home health preference.  I have asked Barbara Cower with [facility] to accept home health referral for home health PT.  Patient has no DME at home and will need a rolling walker and BSC on discharge.     Noted that patient has been made NPO and GI consult has been placed for continual nausea.  Patient has been started on Protonix infusion.  Noted that patient also has acute blood loss and hemoglobin  is being monitor.    TOC will continue to follow for discharge planning.         Final next level of care:  (Pending Medical work up) Barriers to Discharge: No Barriers Identified   Patient Goals and CMS Choice CMS Medicare.gov Compare Post Acute Care list provided to:: Patient Choice offered to / list presented to : Patient  Discharge Placement                         Discharge Plan and Services Additional resources added to the After Visit Summary for      Discharge Planning Services: CM Consult Post Acute Care Choice: Home Health          DME Arranged: 3-N-1, Walker rolling DME Agency: [facility] Date DME Agency Contacted: 03/01/23   Representative spoke with at DME Agency: Jonny Ruiz HH Arranged: PT HH Agency: [facility] Date HH Agency Contacted: 02/28/23   Representative spoke with at [facility] Agency: Barbara Cower  Social Determinants of Health (SDOH) Interventions SDOH Screenings   Food Insecurity: No Food Insecurity (02/25/2023)  Housing: Patient Declined (02/25/2023)  Transportation Needs: No Transportation Needs (02/25/2023)  Utilities: Not At Risk (02/25/2023)  Financial Resource Strain: Medium Risk (08/30/2022)   Received from [facility]  Physical Activity: Inactive (05/16/2020)   Received from [facility]  Social Connections: Moderately Integrated (05/16/2020)   Received from [facility]  Stress: No Stress Concern Present (05/16/2020)   Received from [facility]  Tobacco Use: High Risk (02/25/2023)  Health Literacy: Low Risk  (08/30/2022)   Received from [facility]     Readmission Risk Interventions     No data to display

## 2023-03-02 ENCOUNTER — Inpatient Hospital Stay: Payer: 59 | Admitting: General Practice

## 2023-03-02 ENCOUNTER — Encounter
Admission: EM | Disposition: A | Discharge status: Home Health | Payer: Self-pay | Source: Home / Self Care | Attending: Internal Medicine

## 2023-03-02 DIAGNOSIS — K289 Gastrojejunal ulcer, unspecified as acute or chronic, without hemorrhage or perforation: Secondary | ICD-10-CM | POA: Diagnosis not present

## 2023-03-02 DIAGNOSIS — R55 Syncope and collapse: Secondary | ICD-10-CM | POA: Diagnosis not present

## 2023-03-02 DIAGNOSIS — K922 Gastrointestinal hemorrhage, unspecified: Secondary | ICD-10-CM | POA: Diagnosis not present

## 2023-03-02 DIAGNOSIS — D62 Acute posthemorrhagic anemia: Secondary | ICD-10-CM | POA: Diagnosis not present

## 2023-03-02 HISTORY — PX: HEMOSTASIS CONTROL: SHX6838

## 2023-03-02 HISTORY — PX: ESOPHAGOGASTRODUODENOSCOPY (EGD) WITH PROPOFOL: SHX5813

## 2023-03-02 LAB — BASIC METABOLIC PANEL
Anion gap: 6 (ref 5–15)
BUN: 12 mg/dL (ref 6–20)
CO2: 26 mmol/L (ref 22–32)
Calcium: 7.7 mg/dL — ABNORMAL LOW (ref 8.9–10.3)
Chloride: 104 mmol/L (ref 98–111)
Creatinine, Ser: 0.51 mg/dL (ref 0.44–1.00)
GFR, Estimated: >60 mL/min (ref >60)
Glucose, Bld: 85 mg/dL (ref 70–99)
Potassium: 3.1 mmol/L — ABNORMAL LOW (ref 3.5–5.1)
Sodium: 136 mmol/L (ref 135–145)

## 2023-03-02 LAB — PREPARE RBC (CROSSMATCH)

## 2023-03-02 LAB — TYPE AND SCREEN
Unit division: 0
Unit division: 0

## 2023-03-02 LAB — CBC
HCT: 21.8 % — ABNORMAL LOW (ref 36.0–46.0)
Hemoglobin: 7.2 g/dL — ABNORMAL LOW (ref 12.0–15.0)
MCH: 30 pg (ref 26.0–34.0)
MCHC: 33 g/dL (ref 30.0–36.0)
MCV: 90.8 fL (ref 80.0–100.0)
Platelets: 298 10*3/uL (ref 150–400)
RBC: 2.4 MIL/uL — ABNORMAL LOW (ref 3.87–5.11)
RDW: 12.5 % (ref 11.5–15.5)
WBC: 9.5 10*3/uL (ref 4.0–10.5)
nRBC: 0 % (ref 0.0–0.2)

## 2023-03-02 LAB — GLUCOSE, CAPILLARY
Glucose-Capillary: 61 mg/dL — ABNORMAL LOW (ref 70–99)
Glucose-Capillary: 93 mg/dL (ref 70–99)

## 2023-03-02 LAB — BPAM RBC
Blood Product Expiration Date: 202408272359
Blood Product Expiration Date: 202408302359
ISSUE DATE / TIME: 202407240924

## 2023-03-02 LAB — HEMOGLOBIN AND HEMATOCRIT, BLOOD
HCT: 27 % — ABNORMAL LOW (ref 36.0–46.0)
Hemoglobin: 8.9 g/dL — ABNORMAL LOW (ref 12.0–15.0)

## 2023-03-02 LAB — VITAMIN B12: Vitamin B-12: 2566 pg/mL — ABNORMAL HIGH (ref 180–914)

## 2023-03-02 SURGERY — ESOPHAGOGASTRODUODENOSCOPY (EGD) WITH PROPOFOL
Anesthesia: General

## 2023-03-02 MED ORDER — SODIUM CHLORIDE 0.9 % IV SOLN: INTRAVENOUS | Status: DC | PRN

## 2023-03-02 MED ORDER — PANTOPRAZOLE SODIUM 40 MG PO TBEC
40.0000 mg | DELAYED_RELEASE_TABLET | Freq: Two times a day (BID) | ORAL | Status: DC
  Administered 2023-03-02 – 2023-03-03 (×2): 40 mg via ORAL
  Filled 2023-03-02 (×2): qty 1

## 2023-03-02 MED ORDER — ONDANSETRON HCL 4 MG/2ML IJ SOLN
INTRAMUSCULAR | Status: AC
  Filled 2023-03-02: qty 2

## 2023-03-02 MED ORDER — POTASSIUM CHLORIDE 10 MEQ/100ML IV SOLN
10.0000 meq | INTRAVENOUS | Status: AC
  Administered 2023-03-02 (×2): 10 meq via INTRAVENOUS
  Filled 2023-03-02 (×2): qty 100

## 2023-03-02 MED ORDER — LIDOCAINE HCL (CARDIAC) PF 100 MG/5ML IV SOSY
PREFILLED_SYRINGE | INTRAVENOUS | Status: DC | PRN
  Administered 2023-03-02: 80 mg via INTRAVENOUS

## 2023-03-02 MED ORDER — POTASSIUM CHLORIDE 2 MEQ/ML IV SOLN
INTRAVENOUS | Status: DC
  Filled 2023-03-02 (×3): qty 1000

## 2023-03-02 MED ORDER — POTASSIUM CHLORIDE 20 MEQ PO PACK
40.0000 meq | PACK | Freq: Two times a day (BID) | ORAL | Status: DC
  Administered 2023-03-02: 40 meq via ORAL
  Filled 2023-03-02 (×3): qty 2

## 2023-03-02 MED ORDER — SODIUM CHLORIDE 0.9% IV SOLUTION: Freq: Once | INTRAVENOUS | Status: AC

## 2023-03-02 MED ORDER — ONDANSETRON HCL 4 MG/2ML IJ SOLN
4.0000 mg | Freq: Once | INTRAMUSCULAR | Status: AC
  Administered 2023-03-02: 4 mg via INTRAVENOUS

## 2023-03-02 MED ORDER — PROPOFOL 500 MG/50ML IV EMUL
INTRAVENOUS | Status: DC | PRN
  Administered 2023-03-02: 100 ug/kg/min via INTRAVENOUS

## 2023-03-02 MED ORDER — PROPOFOL 10 MG/ML IV BOLUS
INTRAVENOUS | Status: DC | PRN
  Administered 2023-03-02: 80 mg via INTRAVENOUS
  Administered 2023-03-02: 20 mg via INTRAVENOUS

## 2023-03-02 NOTE — Progress Notes (Signed)
       CROSS COVER NOTE  NAME: Tara Craig MRN: [id number] DOB : 1961-06-05    Concern as stated by nurse / staff    potassium 3.1 and H/H 7.2/21.8.     Pertinent findings on chart review:    Latest Ref Rng & Units 03/02/2023    4:06 AM 03/01/2023   12:55 PM 03/01/2023    4:09 AM  CBC  WBC 4.0 - 10.5 K/uL 9.5   10.9   Hemoglobin 12.0 - 15.0 g/dL 7.2  8.4  6.2   Hematocrit 36.0 - 46.0 % 21.8   18.2   Platelets 150 - 400 K/uL 298   286       03/02/2023    5:00 AM 03/01/2023   11:33 PM 03/01/2023    5:09 PM  Vitals with BMI  Weight 126 lbs 2 oz    BMI 24.63    Systolic  92 97  Diastolic  67 71  Pulse  85 93     Assessment and  Interventions   Assessment: Ongoing blood loss anemia for endoscopy this morning  Plan: Transfuse 1 unit PRBC to hemoglobin of 8 X X

## 2023-03-02 NOTE — Anesthesia Postprocedure Evaluation (Signed)
Anesthesia Post Note  Patient: AMMA CREAR  Procedure(s) Performed: ESOPHAGOGASTRODUODENOSCOPY (EGD) WITH PROPOFOL HEMOSTASIS CONTROL  Patient location during evaluation: Endoscopy Anesthesia Type: General Level of consciousness: awake and alert Pain management: pain level controlled Vital Signs Assessment: post-procedure vital signs reviewed and stable Respiratory status: spontaneous breathing, nonlabored ventilation, respiratory function stable and patient connected to nasal cannula oxygen Cardiovascular status: blood pressure returned to baseline and stable Postop Assessment: no apparent nausea or vomiting Anesthetic complications: no  No notable events documented.   Last Vitals:  Vitals:   03/02/23 1130 03/02/23 1242  BP: (!) 145/75 (!) 166/87  Pulse: 79   Resp: 20   Temp: 36.5 C (!) 36.1 C  SpO2: 94% 99%    Last Pain:  Vitals:   03/02/23 1302  TempSrc:   PainSc: 0-No pain                 Stephanie Coup

## 2023-03-02 NOTE — OR Nursing (Signed)
Pt wretching. Nauseated. Dr Lorette Ang ordered zofran 4mg  iv now. Med adm with relief of sx

## 2023-03-02 NOTE — Progress Notes (Addendum)
Progress Note    Tara Craig  NWG:[id number] DOB: [birth year]  DOA: 02/25/2023 PCP: [name], MD      Brief Narrative:    "Tara Craig is a 62 y.o. female with medical history significant for recurrent "blackout" episodes been evaluated by physicians at [facility], hypertension, paroxysmal SVT, mild intermittent asthma, opioid dependence on Suboxone, depression, anxiety, hyperlipidemia, recent diagnosis of mild OSA, recently diagnosed candida esophagitis, recently diagnosed right upper lung nodule suspected to be lung cancer.  He said she has had recurrent episodes of passing out, which she describes as blackouts, for about a year.  She presented to the hospital severe pain in the left hip after she had one of those syncopal episodes.  She was found to have left hip fracture."  Further hospital course and management as outlined below.  In summary, hospital course complicated by upper GI bleeding and acute anemia requiring blood transfusions.    Assessment/Plan:   Principal Problem:   Syncope Active Problems:   Femur fracture (HCC)   Paroxysmal SVT (supraventricular tachycardia)   Opioid dependence on maintenance agonist therapy, no symptoms (HCC)   Essential hypertension   Candida esophagitis (HCC)   Anxiety and depression   Lung nodule   Mild intermittent asthma without complication   Acute upper GI bleeding   Acute blood loss anemia   Gastrojejunal ulcer    Body mass index is 24.63 kg/m.   S/p syncope Patient reports she has been having syncopal episodes for 1 year now and has followed up with her PCP.  Unclear etiology. Pt denies prodromal symptoms prior to episodes.  Echocardiogram in October 2022 demonstrated normal LV and RV size and function.   History of paroxysmal SVTs.   --HCTZ and chlorthalidone have been held 2D echo showed EF estimated at 55 to 60%. --Consider Zio patch ambulatory monitor if no dysrhtymias on telemetry during  admission --Seems unlikely orthostatic hypotension without prodrome symptoms --Check orthostatics when anemia is better   Hypokalemia - K 3.1 this AM (7/24).   --IV fluid additive and K riders ordered for replacement --Monitor BMP, check Mg level    Left intertrochanteric femur fracture (HCC) S/p intramedullary nail intertrochanteric left hip on 02/26/2023. Analgesics as needed for pain.   Follow-up with orthopedic surgeon.   PT recommended discharge to SNF Currently Lovenox is held due to GI bleed and anemia   Acute blood loss anemia Hemoglobin dropped from 11.6 >> 8.7 >> 8.4 >>7.6 >> 6.2. Transfused 1 unit pRBC's on 7/23.  Hbg improved to 8.4 >> 7.2 2nd unit pRBC's ordered by overnight provider --Check post-transfusion Hbg --Trend Hbg & transfuse if < 7 or >8 with active bleeding   Hematemesis/acute upper GI bleeding EGD this AM (7/24) -- showed a gastrojejunal anastamosis ulceration with visible vessel that was cauterized. --Hold Lovenox for now --Mgmt per GI - see their recs --Continue IV Protonix. --Discharge on Prilosec 40 mg PO BID indefinitely --Advance diet as tolerated  Hypotension Discontinue lisinopril and monitor BP    Paroxysmal SVT (supraventricular tachycardia) EKG showed sinus tachycardia on admission    Opioid dependence on maintenance agonist therapy, no symptoms (HCC) - Continue home Suboxone    Candida esophagitis (HCC) Chart review showed recent EGD on 02/07/2023.   She was prescribed a 3-week course of fluconazole and she has taken 2 weeks worth of fluconazole thus far. --Continue fluconazole through 03/04/2023     Lung nodule Patient recently had a CT of the  chest that demonstrated a new spiculated nodule of the right upper lobe concerning for primary malignancy.  Referred to oncology outpatient. PET scan scheduled on 03/11/2023  Follow-up with oncologist on 03/15/2023.    Other comorbidities are stable,  including: --anxiety --hypertension --depression --mild intermittent asthma --tobacco use disorder    Diet Order             Diet regular Room service appropriate? Yes; Fluid consistency: Thin  Diet effective now                     Consultants: Orthopedic surgeon Gastroenterology  Procedures: Intramedullary nail intertrochanteric left hip EGD    Family Communication/Anticipated D/C date and plan/Code Status   DVT prophylaxis: Place and maintain sequential compression device Start: 02/28/23 2209 SCDs Start: 02/26/23 1355 SCDs Start: 02/25/23 1918     Code Status: Full Code  Family Communication: None Disposition Plan: Home with home health therapy in 24-48 hours if Hbg remains stable.   Status is: Inpatient Remains inpatient appropriate because: ongoing evaluation of GI bleeding and anemia requiring blood transfusion today.    Subjective:   Pt seen before EGD today.  Denies abdominal pain or any furhter signs of bleeding.  She denies any prodromal symptoms before her syncopal episodes, says she just comes to on the floor.  Currently getting IV potassium and reports burning at IV site.  Objective:    Vitals:   03/02/23 1000 03/02/23 1032 03/02/23 1130 03/02/23 1242  BP: 136/85 (!) 148/84 (!) 145/75 (!) 166/87  Pulse: 77 79 79   Resp: 16 16 20    Temp: 98.7 F (37.1 C) 98.4 F (36.9 C) 97.7 F (36.5 C) (!) 97 F (36.1 C)  TempSrc: Oral  Temporal Temporal  SpO2: 100%  94% 99%  Weight:      Height:       No data found.   Intake/Output Summary (Last 24 hours) at 03/02/2023 1248 Last data filed at 03/02/2023 1237 Gross per 24 hour  Intake 350 ml  Output --  Net 350 ml    Filed Weights   02/25/23 2036 02/26/23 0748 03/02/23 0500  Weight: 57 kg 57 kg 57.2 kg    Exam:   General exam: awake, alert, no acute distress HEENT: moist mucus membranes, hearing grossly normal  Respiratory system: CTAB, no wheezes, rales or rhonchi, normal  respiratory effort. Cardiovascular system: normal S1/S2, RRR, no pedal edema.   Gastrointestinal system: soft, NT, ND Central nervous system: A&O x3. no gross focal neurologic deficits, normal speech Extremities: moves all, no edema, normal tone Skin: dry, intact, normal temperature Psychiatry: normal mood, congruent affect, judgement and insight appear normal KI        Data Reviewed:   I have personally reviewed following labs and imaging studies:  Labs: Labs show the following:   Basic Metabolic Panel: Recent Labs  Lab 02/25/23 1625 02/26/23 0435 02/27/23 0440 02/28/23 0543 03/01/23 0409 03/02/23 0406  NA 136 140 135 137 137 136  K 3.9 4.0 3.6 3.7 3.8 3.1*  CL 101 106 102 102 104 104  CO2 27 29 25 28 27 26   GLUCOSE 83 93 121* 116* 102* 85  BUN 26* 21* 11 12 24* 12  CREATININE 0.75 0.74 0.54 0.53 0.52 0.51  CALCIUM 8.4* 8.3* 7.9* 8.0* 7.5* 7.7*  MG 2.4  --   --   --   --   --   PHOS 4.1  --   --   --   --   --  GFR Estimated Creatinine Clearance: 59.3 mL/min (by C-G formula based on SCr of 0.51 mg/dL). Liver Function Tests: Recent Labs  Lab 02/26/23 0435  AST 16  ALT 17  ALKPHOS 54  BILITOT 0.3  PROT 5.3*  ALBUMIN 2.9*   No results for input(s): "LIPASE", "AMYLASE" in the last 168 hours. No results for input(s): "AMMONIA" in the last 168 hours. Coagulation profile Recent Labs  Lab 02/25/23 1755  INR 1.1    CBC: Recent Labs  Lab 02/26/23 0435 02/27/23 0440 02/28/23 0543 02/28/23 2229 03/01/23 0409 03/01/23 1255 03/02/23 0406  WBC 8.3 10.7* 10.0  --  10.9*  --  9.5  HGB 11.3* 8.7* 8.4* 7.6* 6.2* 8.4* 7.2*  HCT 34.3* 26.4* 25.5*  --  18.2*  --  21.8*  MCV 91.7 90.7 90.4  --  89.7  --  90.8  PLT 281 271 299  --  286  --  298   Cardiac Enzymes: No results for input(s): "CKTOTAL", "CKMB", "CKMBINDEX", "TROPONINI" in the last 168 hours. BNP (last 3 results) No results for input(s): "PROBNP" in the last 8760 hours. CBG: Recent Labs  Lab  02/28/23 0802 03/01/23 0812 03/02/23 0450 03/02/23 0557  GLUCAP 117* 84 61* 93   D-Dimer: No results for input(s): "DDIMER" in the last 72 hours. Hgb A1c: No results for input(s): "HGBA1C" in the last 72 hours. Lipid Profile: No results for input(s): "CHOL", "HDL", "LDLCALC", "TRIG", "CHOLHDL", "LDLDIRECT" in the last 72 hours. Thyroid function studies: No results for input(s): "TSH", "T4TOTAL", "T3FREE", "THYROIDAB" in the last 72 hours.  Invalid input(s): "FREET3"  Anemia work up: Recent Labs    03/01/23 0547 03/02/23 0406  VITAMINB12  --  2,566*  FOLATE 19.2  --   FERRITIN 273  --   TIBC 161*  --   IRON 27*  --    Sepsis Labs: Recent Labs  Lab 02/27/23 0440 02/28/23 0543 03/01/23 0409 03/02/23 0406  WBC 10.7* 10.0 10.9* 9.5    Microbiology Recent Results (from the past 240 hour(s))  Surgical pcr screen     Status: Abnormal   Collection Time: 02/26/23  7:45 AM   Specimen: Nasal Mucosa; Nasal Swab  Result Value Ref Range Status   MRSA, PCR NEGATIVE NEGATIVE Final   Staphylococcus aureus POSITIVE (A) NEGATIVE Final    Comment: (NOTE) The Xpert SA Assay (FDA approved for NASAL specimens in patients 67 years of age and older), is one component of a comprehensive surveillance program. It is not intended to diagnose infection nor to guide or monitor treatment. Performed at [facility], [address] [place], [place] [address]     Procedures and diagnostic studies:  No results found.             LOS: 5 days   Pennie Banter, DO  [facility]   Pager on [email or web address]. If 7PM-7AM, please contact night-coverage at [email or web address]     03/02/2023, 12:48 PM

## 2023-03-02 NOTE — Plan of Care (Signed)
  Problem: Education: Goal: Knowledge of condition and prescribed therapy will improve Outcome: Progressing   Problem: Physical Regulation: Goal: Complications related to the disease process, condition or treatment will be avoided or minimized Outcome: Progressing   Problem: Education: Goal: Knowledge of General Education information will improve Description: Including pain rating scale, medication(s)/side effects and non-pharmacologic comfort measures Outcome: Progressing   Problem: Clinical Measurements: Goal: Will remain free from infection Outcome: Progressing   Problem: Nutrition: Goal: Adequate nutrition will be maintained Outcome: Progressing   Problem: Pain Managment: Goal: General experience of comfort will improve Outcome: Progressing   Problem: Safety: Goal: Ability to remain free from injury will improve Outcome: Progressing   Problem: Skin Integrity: Goal: Risk for impaired skin integrity will decrease Outcome: Progressing

## 2023-03-02 NOTE — Anesthesia Preprocedure Evaluation (Signed)
Anesthesia Evaluation  Patient identified by MRN, date of birth, ID band Patient awake    Reviewed: Allergy & Precautions, NPO status , Patient's Chart, lab work & pertinent test results  History of Anesthesia Complications Negative for: history of anesthetic complications  Airway Mallampati: III  TM Distance: >3 FB Neck ROM: full    Dental  (+) Chipped, Dental Advidsory Given   Pulmonary asthma , sleep apnea , Current Smoker   Pulmonary exam normal        Cardiovascular hypertension, On Medications + dysrhythmias Supra Ventricular Tachycardia   EKG 7/19 Sinus tachycardia Paired ventricular premature complexes Nonspecific intraventricular conduction delay Borderline ST depression, diffuse leads   Neuro/Psych  PSYCHIATRIC DISORDERS Anxiety Depression     Neuromuscular disease    GI/Hepatic ,GERD  Controlled,,(+)     substance abuse (hx of opiod dependence on suboxone)    Endo/Other  negative endocrine ROS    Renal/GU      Musculoskeletal   Abdominal   Peds  Hematology  (+) Blood dyscrasia, anemia   Anesthesia Other Findings Past Medical History: No date: Arthritis No date: Asthma No date: Back pain, chronic No date: Bilateral foot-drop No date: Skin cancer  Past Surgical History: No date: ABDOMINAL HYSTERECTOMY No date: APPENDECTOMY 2010: BACK SURGERY No date: TENNIS ELBOW RELEASE/NIRSCHEL PROCEDURE  BMI    Body Mass Index: 24.54 kg/m      Reproductive/Obstetrics negative OB ROS                             Anesthesia Physical Anesthesia Plan  ASA: 3  Anesthesia Plan: Spinal   Post-op Pain Management: Minimal or no pain anticipated   Induction: Intravenous  PONV Risk Score and Plan: 2 and Propofol infusion, TIVA and Ondansetron  Airway Management Planned: Natural Airway and Nasal Cannula  Additional Equipment:   Intra-op Plan:   Post-operative Plan:    Informed Consent: I have reviewed the patients History and Physical, chart, labs and discussed the procedure including the risks, benefits and alternatives for the proposed anesthesia with the patient or authorized representative who has indicated his/her understanding and acceptance.     Dental Advisory Given  Plan Discussed with: Anesthesiologist, CRNA and Surgeon  Anesthesia Plan Comments: (Patient consented for risks of anesthesia including but not limited to:  - adverse reactions to medications - risk of airway placement if required - damage to eyes, teeth, lips or other oral mucosa - nerve damage due to positioning  - sore throat or hoarseness - Damage to heart, brain, nerves, lungs, other parts of body or loss of life  Patient voiced understanding.)        Anesthesia Quick Evaluation

## 2023-03-02 NOTE — Progress Notes (Addendum)
PT Cancellation Note  Patient Details Name: Tara Craig MRN: [id number] DOB: 09-17-1960   Cancelled Treatment:    Reason Eval/Treat Not Completed: Other (comment) Attempted to see pt for PT tx. Pt received in bed with nurse present, initiating blood transfusion. Per protocol, will hold PT tx at this time.  Addendum: Attempted to see pt at 11:20am but pt being transported off the unit for imaging. Will f/u as able.    Aleda Grana, PT, DPT 03/02/23, 10:23 AM  Sandi Mariscal 03/02/2023, 10:23 AM

## 2023-03-02 NOTE — Progress Notes (Signed)
PT Cancellation Note  Patient Details Name: Tara Craig MRN: [id number] DOB: 1961-07-01   Cancelled Treatment:     PT attempt. 3rd attempt today. Pt politely request PT return tomorrow. Pt just received lunch tray from missing lunch from going for EGD. Acute PT will continue to follow an return tomorrow as requested.    Rushie Chestnut 03/02/2023, 4:19 PM

## 2023-03-02 NOTE — Transfer of Care (Signed)
Immediate Anesthesia Transfer of Care Note  Patient: Tara Craig  Procedure(s) Performed: ESOPHAGOGASTRODUODENOSCOPY (EGD) WITH PROPOFOL  Patient Location: PACU  Anesthesia Type:General  Level of Consciousness: awake and alert   Airway & Oxygen Therapy: Patient Spontanous Breathing  Post-op Assessment: Report given to RN and Post -op Vital signs reviewed and stable  Post vital signs: Reviewed and stable  Last Vitals:  Vitals Value Taken Time  BP 166/87 03/02/23 1243  Temp 36.1 C 03/02/23 1242  Pulse    Resp 19 03/02/23 1244  SpO2 99 % 03/02/23 1242  Vitals shown include unfiled device data.  Last Pain:  Vitals:   03/02/23 1242  TempSrc: Temporal  PainSc: 0-No pain      Patients Stated Pain Goal: 3 (03/01/23 2017)  Complications: No notable events documented.

## 2023-03-02 NOTE — Progress Notes (Signed)
Subjective: Day of Surgery Procedure(s) (LRB): ESOPHAGOGASTRODUODENOSCOPY (EGD) WITH PROPOFOL (N/A) HEMOSTASIS CONTROL Patient was off the floor for upper GI endoscopy today.  Hemoglobin was stable today.  Patient reports pain as  unknown .  Objective:   VITALS:   Vitals:   03/02/23 1242 03/02/23 1344  BP: (!) 166/87 (!) 160/88  Pulse:  67  Resp:  15  Temp: (!) 97 F (36.1 C) 98.3 F (36.8 C)  SpO2: 99% 93%    Unable to perform today.  LABS Recent Labs    02/28/23 0543 02/28/23 2229 03/01/23 0409 03/01/23 1255 03/02/23 0406  HGB 8.4*   < > 6.2* 8.4* 7.2*  HCT 25.5*  --  18.2*  --  21.8*  WBC 10.0  --  10.9*  --  9.5  PLT 299  --  286  --  298   < > = values in this interval not displayed.    Recent Labs    02/28/23 0543 03/01/23 0409 03/02/23 0406  NA 137 137 136  K 3.7 3.8 3.1*  BUN 12 24* 12  CREATININE 0.53 0.52 0.51  GLUCOSE 116* 102* 85    No results for input(s): "LABPT", "INR" in the last 72 hours.   Assessment/Plan: Day of Surgery Procedure(s) (LRB): ESOPHAGOGASTRODUODENOSCOPY (EGD) WITH PROPOFOL (N/A) HEMOSTASIS CONTROL   Up with therapy Discharge home with home health Return to office in 2 weeks for exam and x-rays.

## 2023-03-02 NOTE — Op Note (Signed)
[facility] Gastroenterology Patient Name: Tara Craig Procedure Date: 03/02/2023 11:59 AM MRN: [id number] Account #: [id number] Date of Birth: Apr 18, 1961 Admit Type: Inpatient Age: 62 Room: [facility] ENDO ROOM 4 Gender: Female Note Status: Finalized Instrument Name: Upper Endoscope [id number] Procedure:             Upper GI endoscopy Indications:           Acute post hemorrhagic anemia, Hematemesis Providers:             Toney Reil MD, MD Referring MD:          No Local Md, MD (Referring MD) Medicines:             General Anesthesia Complications:         No immediate complications. Estimated blood loss: None. Procedure:             Pre-Anesthesia Assessment:                        - Prior to the procedure, a History and Physical was                         performed, and patient medications and allergies were                         reviewed. The patient is competent. The risks and                         benefits of the procedure and the sedation options and                         risks were discussed with the patient. All questions                         were answered and informed consent was obtained.                         Patient identification and proposed procedure were                         verified by the physician, the nurse, the                         anesthesiologist, the anesthetist and the technician                         in the pre-procedure area in the procedure room in the                         endoscopy suite. Mental Status Examination: alert and                         oriented. Airway Examination: normal oropharyngeal                         airway and neck mobility. Respiratory Examination:                         clear to auscultation. CV Examination: normal.  Prophylactic Antibiotics: The patient does not require                         prophylactic antibiotics. Prior Anticoagulants: The                          patient has taken no anticoagulant or antiplatelet                         agents. ASA Grade Assessment: III - A patient with                         severe systemic disease. After reviewing the risks and                         benefits, the patient was deemed in satisfactory                         condition to undergo the procedure. The anesthesia                         plan was to use general anesthesia. Immediately prior                         to administration of medications, the patient was                         re-assessed for adequacy to receive sedatives. The                         heart rate, respiratory rate, oxygen saturations,                         blood pressure, adequacy of pulmonary ventilation, and                         response to care were monitored throughout the                         procedure. The physical status of the patient was                         re-assessed after the procedure.                        After obtaining informed consent, the endoscope was                         passed under direct vision. Throughout the procedure,                         the patient's blood pressure, pulse, and oxygen                         saturations were monitored continuously. The Endoscope                         was introduced through the mouth, and advanced to the  anastomosis site of gastric bypass. The upper GI                         endoscopy was accomplished without difficulty. The                         patient tolerated the procedure well. Findings:      Evidence of a Roux-en-Y gastrojejunostomy was found. The gastrojejunal       anastomosis was characterized by ulceration with visible vessel. This       was traversed. The pouch-to-jejunum limb was characterized by healthy       appearing mucosa. Coagulation for hemostasis using bipolar probe was       successful. Estimated blood loss: none.      Diffuse, white plaques  were found in the entire esophagus. Impression:            - Roux-en-Y gastrojejunostomy with gastrojejunal                         anastomosis characterized by ulceration. Treated with                         bipolar cautery.                        - Esophageal plaques were found, secondary to                         candidiasis.                        - No specimens collected. Recommendation:        - Return patient to hospital ward for ongoing care.                        - Advance diet as tolerated today.                        - Continue present medications.                        - Follow an antireflux regimen for the rest of the                         patient's life.                        - Use Prilosec (omeprazole) 40 mg PO BID for the rest                         of the patient's life. Procedure Code(s):     --- Professional ---                        [id number], Esophagogastroduodenoscopy, flexible,                         transoral; with control of bleeding, any method Diagnosis Code(s):     --- Professional ---  Z98.0, Intestinal bypass and anastomosis status                        B37.81, Candidal esophagitis                        D62, Acute posthemorrhagic anemia                        K92.0, Hematemesis CPT copyright 2022 American Medical Association. All rights reserved. The codes documented in this report are preliminary and upon coder review may  be revised to meet current compliance requirements. Dr. Libby Maw Toney Reil MD, MD 03/02/2023 12:38:40 PM This report has been signed electronically. Number of Addenda: 0 Note Initiated On: 03/02/2023 11:59 AM Estimated Blood Loss:  Estimated blood loss: none.      [facility]

## 2023-03-02 NOTE — Plan of Care (Signed)
  Problem: Education: Goal: Knowledge of condition and prescribed therapy will improve Outcome: Progressing   Problem: Cardiac: Goal: Will achieve and/or maintain adequate cardiac output Outcome: Progressing   Problem: Physical Regulation: Goal: Complications related to the disease process, condition or treatment will be avoided or minimized Outcome: Progressing   Problem: Education: Goal: Knowledge of General Education information will improve Description: Including pain rating scale, medication(s)/side effects and non-pharmacologic comfort measures Outcome: Progressing   Problem: Health Behavior/Discharge Planning: Goal: Ability to manage health-related needs will improve Outcome: Progressing   Problem: Clinical Measurements: Goal: Ability to maintain clinical measurements within normal limits will improve Outcome: Progressing Goal: Will remain free from infection Outcome: Progressing Goal: Diagnostic test results will improve Outcome: Progressing Goal: Respiratory complications will improve Outcome: Progressing Goal: Cardiovascular complication will be avoided Outcome: Progressing   Problem: Activity: Goal: Risk for activity intolerance will decrease Outcome: Progressing   Problem: Nutrition: Goal: Adequate nutrition will be maintained Outcome: Progressing   Problem: Coping: Goal: Level of anxiety will decrease Outcome: Progressing   Problem: Elimination: Goal: Will not experience complications related to bowel motility Outcome: Progressing Goal: Will not experience complications related to urinary retention Outcome: Progressing   Problem: Pain Managment: Goal: General experience of comfort will improve Outcome: Progressing   Problem: Safety: Goal: Ability to remain free from injury will improve Outcome: Progressing   Problem: Skin Integrity: Goal: Risk for impaired skin integrity will decrease Outcome: Progressing   Problem: Education: Goal:  Verbalization of understanding the information provided (i.e., activity precautions, restrictions, etc) will improve Outcome: Progressing Goal: Individualized Educational Video(s) Outcome: Progressing   Problem: Activity: Goal: Ability to ambulate and perform ADLs will improve Outcome: Progressing   Problem: Clinical Measurements: Goal: Postoperative complications will be avoided or minimized Outcome: Progressing   Problem: Self-Concept: Goal: Ability to maintain and perform role responsibilities to the fullest extent possible will improve Outcome: Progressing   Problem: Pain Management: Goal: Pain level will decrease Outcome: Progressing

## 2023-03-03 ENCOUNTER — Encounter: Payer: Self-pay | Admitting: Gastroenterology

## 2023-03-03 DIAGNOSIS — K289 Gastrojejunal ulcer, unspecified as acute or chronic, without hemorrhage or perforation: Secondary | ICD-10-CM | POA: Diagnosis not present

## 2023-03-03 DIAGNOSIS — R55 Syncope and collapse: Secondary | ICD-10-CM | POA: Diagnosis not present

## 2023-03-03 DIAGNOSIS — D62 Acute posthemorrhagic anemia: Secondary | ICD-10-CM | POA: Diagnosis not present

## 2023-03-03 DIAGNOSIS — I959 Hypotension, unspecified: Secondary | ICD-10-CM

## 2023-03-03 DIAGNOSIS — I10 Essential (primary) hypertension: Secondary | ICD-10-CM | POA: Diagnosis not present

## 2023-03-03 DIAGNOSIS — E876 Hypokalemia: Secondary | ICD-10-CM | POA: Clinically undetermined

## 2023-03-03 HISTORY — DX: Hypokalemia: E87.6

## 2023-03-03 HISTORY — DX: Hypotension, unspecified: I95.9

## 2023-03-03 LAB — BASIC METABOLIC PANEL
Anion gap: 8 (ref 5–15)
BUN: 7 mg/dL (ref 6–20)
CO2: 27 mmol/L (ref 22–32)
Creatinine, Ser: 0.47 mg/dL (ref 0.44–1.00)
Glucose, Bld: 93 mg/dL (ref 70–99)
Potassium: 3.5 mmol/L (ref 3.5–5.1)
Sodium: 140 mmol/L (ref 135–145)

## 2023-03-03 LAB — BPAM RBC
Unit Type and Rh: 5100
Unit Type and Rh: 5100

## 2023-03-03 LAB — TYPE AND SCREEN
ABO/RH(D): O POS
Antibody Screen: NEGATIVE

## 2023-03-03 LAB — CBC
HCT: 31.1 % — ABNORMAL LOW (ref 36.0–46.0)
Hemoglobin: 9.9 g/dL — ABNORMAL LOW (ref 12.0–15.0)
MCH: 30.1 pg (ref 26.0–34.0)
MCHC: 31.8 g/dL (ref 30.0–36.0)
Platelets: 312 10*3/uL (ref 150–400)
RBC: 3.29 MIL/uL — ABNORMAL LOW (ref 3.87–5.11)
RDW: 13.4 % (ref 11.5–15.5)
WBC: 8.5 10*3/uL (ref 4.0–10.5)
nRBC: 0 % (ref 0.0–0.2)

## 2023-03-03 MED ORDER — FLUCONAZOLE 100 MG PO TABS: 100.0000 mg | ORAL_TABLET | Freq: Every day | ORAL | 0 refills | Status: AC

## 2023-03-03 MED ORDER — ACETAMINOPHEN 325 MG PO TABS: 650.0000 mg | ORAL_TABLET | Freq: Four times a day (QID) | ORAL | Status: AC | PRN

## 2023-03-03 MED ORDER — SENNA 8.6 MG PO TABS: 1.0000 | ORAL_TABLET | Freq: Two times a day (BID) | ORAL | 0 refills | Status: DC

## 2023-03-03 MED ORDER — OXYCODONE-ACETAMINOPHEN 5-325 MG PO TABS: 1.0000 | ORAL_TABLET | Freq: Four times a day (QID) | ORAL | 0 refills | Status: AC | PRN

## 2023-03-03 MED ORDER — HYDROCODONE-ACETAMINOPHEN 5-325 MG PO TABS: 1.0000 | ORAL_TABLET | ORAL | 0 refills | Status: DC | PRN

## 2023-03-03 MED ORDER — FERROUS SULFATE 325 (65 FE) MG PO TABS: 325.0000 mg | ORAL_TABLET | Freq: Every day | ORAL | Status: DC

## 2023-03-03 NOTE — Plan of Care (Signed)
  Problem: Education: Goal: Knowledge of condition and prescribed therapy will improve Outcome: Progressing   Problem: Cardiac: Goal: Will achieve and/or maintain adequate cardiac output Outcome: Progressing   Problem: Physical Regulation: Goal: Complications related to the disease process, condition or treatment will be avoided or minimized Outcome: Progressing   Problem: Education: Goal: Knowledge of General Education information will improve Description: Including pain rating scale, medication(s)/side effects and non-pharmacologic comfort measures Outcome: Progressing   Problem: Health Behavior/Discharge Planning: Goal: Ability to manage health-related needs will improve Outcome: Progressing   Problem: Clinical Measurements: Goal: Ability to maintain clinical measurements within normal limits will improve Outcome: Progressing Goal: Will remain free from infection Outcome: Progressing Goal: Diagnostic test results will improve Outcome: Progressing Goal: Respiratory complications will improve Outcome: Progressing Goal: Cardiovascular complication will be avoided Outcome: Progressing   Problem: Activity: Goal: Risk for activity intolerance will decrease Outcome: Progressing   Problem: Nutrition: Goal: Adequate nutrition will be maintained Outcome: Progressing   Problem: Coping: Goal: Level of anxiety will decrease Outcome: Progressing   Problem: Elimination: Goal: Will not experience complications related to bowel motility Outcome: Progressing Goal: Will not experience complications related to urinary retention Outcome: Progressing   Problem: Pain Managment: Goal: General experience of comfort will improve Outcome: Progressing   Problem: Safety: Goal: Ability to remain free from injury will improve Outcome: Progressing   Problem: Skin Integrity: Goal: Risk for impaired skin integrity will decrease Outcome: Progressing   Problem: Education: Goal:  Verbalization of understanding the information provided (i.e., activity precautions, restrictions, etc) will improve Outcome: Progressing Goal: Individualized Educational Video(s) Outcome: Progressing   Problem: Activity: Goal: Ability to ambulate and perform ADLs will improve Outcome: Progressing   Problem: Clinical Measurements: Goal: Postoperative complications will be avoided or minimized Outcome: Progressing   Problem: Self-Concept: Goal: Ability to maintain and perform role responsibilities to the fullest extent possible will improve Outcome: Progressing   Problem: Pain Management: Goal: Pain level will decrease Outcome: Progressing

## 2023-03-03 NOTE — TOC Transition Note (Signed)
Transition of Care [facility]) - CM/SW Discharge Note   Patient Details  Name: Tara Craig MRN: [id number] Date of Birth: [birth year]  Transition of Care [facility]) CM/SW Contact:  Garret Reddish, RN Phone Number: 03/03/2023, 3:20 PM   Clinical Narrative:   Chart reviewed.  Noted that patient has orders for discharge for today.    I have informed Barbara Cower with [facility] that patient would be a discharge for today.  [facility] will provide home health PT services.    PT has provided patient a Agricultural consultant at [facility] for home use.  I have ordered patient a bedside commode for home use.  I have asked John with [employer] to provide a BSC commode.  I have informed Jonny Ruiz that patient has requested BSC be delivered to her home.   I have notified staff nurse of the above information.     Final next level of care: Home w Home Health Services Barriers to Discharge: No Barriers Identified   Patient Goals and CMS Choice CMS Medicare.gov Compare Post Acute Care list provided to:: Patient Choice offered to / list presented to : Patient  Discharge Placement                      Patient and family notified of of transfer: 03/03/23  Discharge Plan and Services Additional resources added to the After Visit Summary for     Discharge Planning Services: CM Consult Post Acute Care Choice: Home Health          DME Arranged: 3-N-1 (PT provided patient with a Rolling Walker for home use at bedside.) DME Agency: [facility] Date DME Agency Contacted: 03/03/23   Representative spoke with at DME Agency: Jonny Ruiz HH Arranged: PT HH Agency: [facility] Date HH Agency Contacted: 02/28/23   Representative spoke with at [facility] Agency: Barbara Cower  Social Determinants of Health (SDOH) Interventions SDOH Screenings   Food Insecurity: No Food Insecurity (02/25/2023)  Housing: Patient Declined (02/25/2023)  Transportation Needs: No Transportation Needs (02/25/2023)  Utilities: Not At Risk (02/25/2023)   Financial Resource Strain: Medium Risk (08/30/2022)   Received from [facility]  Physical Activity: Inactive (05/16/2020)   Received from [facility]  Social Connections: Moderately Integrated (05/16/2020)   Received from [facility]  Stress: No Stress Concern Present (05/16/2020)   Received from [facility]  Tobacco Use: High Risk (02/25/2023)  Health Literacy: Low Risk  (08/30/2022)   Received from [facility]     Readmission Risk Interventions     No data to display

## 2023-03-03 NOTE — Progress Notes (Signed)
Physical Therapy Treatment Patient Details Name: Tara Craig MRN: [id number] DOB: [birth year] Today's Date: 03/03/2023   History of Present Illness Patient is a 62 year old female who presented to ED for syncopal episode. Patient has been experiencing syncopal episodes for ~1 year. She had an episode prior to going to ED, when she came to she was having severe L hip pain and unable to bear weight. She was found to have L femur fracture and underwent L intramedullary nail intertrochanteric L hip  on 02/26/23. PMH includes HTN, paroxysmal SVT, asthma, opioid dependence on Suboxone, depression, anxiety, HLD, OSA, candida esophagitis.    PT Comments  Pt was long sitting in bed upon arrival. She is A and O x 4. Agreeable to session and pleasant throughout. Pt required supervision only to exit bed, stand, and ambulate. She demonstrates safe abilities to perform stairs to simulate home entry. No LOB. Vcs for techniques to adhere to proper PWB. Overall doing well from a PT standpoint. Will continue to follow per current POC. Will need RW/BSC prior to DC.     Assistance Recommended at Discharge Set up Supervision/Assistance  If plan is discharge home, recommend the following:  Can travel by private vehicle    A little help with walking and/or transfers;A lot of help with bathing/dressing/bathroom;Assistance with cooking/housework;Direct supervision/assist for medications management;Help with stairs or ramp for entrance      Equipment Recommendations  Rolling walker (2 wheels);BSC/3in1       Precautions / Restrictions Precautions Precautions: Fall Precaution Comments: PWB Restrictions Weight Bearing Restrictions: Yes LLE Weight Bearing: Partial weight bearing LLE Partial Weight Bearing Percentage or Pounds: 50     Mobility  Bed Mobility Overal bed mobility: Needs Assistance Bed Mobility: Supine to Sit  Supine to sit: Supervision  General bed mobility comments: no physical assistance  required to exit bed    Transfers Overall transfer level: Needs assistance Equipment used: Rolling walker (2 wheels) Transfers: Sit to/from Stand Sit to Stand: Supervision  General transfer comment: Pt was safely able to stand from lowest bed height with supervision only    Ambulation/Gait Ambulation/Gait assistance: Supervision Gait Distance (Feet): 200 Feet Assistive device: Rolling walker (2 wheels)  General Gait Details: pt demonstrats safe steady gait. vcs for PWB techniques and use of BUEs to unweight operative LE.   Stairs Stairs: Yes Stairs assistance: Supervision Stair Management: One rail Left, Step to pattern, Sideways Number of Stairs: 4 General stair comments: Pt demonstrates safe abilities to perform stairs to simulate home environment.     Balance Overall balance assessment: Needs assistance Sitting-balance support: No upper extremity supported, Feet supported Sitting balance-Leahy Scale: Good     Standing balance support: Bilateral upper extremity supported, During functional activity, Reliant on assistive device for balance Standing balance-Leahy Scale: Good       Cognition Arousal/Alertness: Awake/alert Behavior During Therapy: WFL for tasks assessed/performed Overall Cognitive Status: Within Functional Limits for tasks assessed    General Comments: Pt A and O x 4               Pertinent Vitals/Pain Pain Assessment Pain Assessment: 0-10 Pain Score: 4  Pain Location: L hip Pain Descriptors / Indicators: Discomfort, Grimacing Pain Intervention(s): Limited activity within patient's tolerance, Monitored during session, Repositioned     PT Goals (current goals can now be found in the care plan section) Acute Rehab PT Goals Patient Stated Goal: go home today Progress towards PT goals: Progressing toward goals    Frequency  BID      PT Plan Current plan remains appropriate       AM-PAC PT "6 Clicks" Mobility   Outcome Measure   Help needed turning from your back to your side while in a flat bed without using bedrails?: A Little Help needed moving from lying on your back to sitting on the side of a flat bed without using bedrails?: A Little Help needed moving to and from a bed to a chair (including a wheelchair)?: A Little Help needed standing up from a chair using your arms (e.g., wheelchair or bedside chair)?: A Little Help needed to walk in hospital room?: A Little Help needed climbing 3-5 steps with a railing? : A Little 6 Click Score: 18    End of Session Equipment Utilized During Treatment: Gait belt Activity Tolerance: Patient tolerated treatment well Patient left: in chair;with call bell/phone within reach;with chair alarm set Nurse Communication: Mobility status;Weight bearing status PT Visit Diagnosis: Unsteadiness on feet (R26.81);Other abnormalities of gait and mobility (R26.89);Repeated falls (R29.6);Muscle weakness (generalized) (M62.81);History of falling (Z91.81);Difficulty in walking, not elsewhere classified (R26.2);Pain Pain - Right/Left: Left Pain - part of body: Hip     Time: 8657-8469 PT Time Calculation (min) (ACUTE ONLY): 25 min  Charges:    $Gait Training: 23-37 mins PT General Charges $$ ACUTE PT VISIT: 1 Visit                     Jetta Lout PTA 03/03/23, 8:58 AM

## 2023-03-03 NOTE — Discharge Summary (Signed)
Physician Discharge Summary   Patient: Tara Craig MRN: [id number] DOB: 1961/05/04  Admit date:     02/25/2023  Discharge date: 03/03/2023  Discharge Physician: Pennie Banter   PCP: [name], MD   Recommendations at discharge:   Follow up as scheduled with Orthopedic Surgery in 2 weeks Follow up with Primary Care in 1-2 weeks Repeat CBC, BMP, Mg in 1-2 weeks For completion of evaluation for recurrent syncope, recommend ambulatory heart monitor be arranged for 2-4 weeks monitoring in outpatient setting Follow up with Gastroenterology in 1 month  Discharge Diagnoses: Principal Problem:   Syncope Active Problems:   Femur fracture (HCC)   Paroxysmal SVT (supraventricular tachycardia)   Opioid dependence on maintenance agonist therapy, no symptoms (HCC)   Essential hypertension   Candida esophagitis (HCC)   Anxiety and depression   Lung nodule   Mild intermittent asthma without complication   Acute blood loss anemia   Gastrojejunal ulcer  Resolved Problems:   Acute upper GI bleeding   Hypokalemia   Hypotension  Hospital Course:  "Tara Craig is a 62 y.o. female with medical history significant for recurrent "blackout" episodes been evaluated by physicians at [facility], hypertension, paroxysmal SVT, mild intermittent asthma, opioid dependence on Suboxone, depression, anxiety, hyperlipidemia, recent diagnosis of mild OSA, recently diagnosed candida esophagitis, recently diagnosed right upper lung nodule suspected to be lung cancer.  He said she has had recurrent episodes of passing out, which she describes as blackouts, for about a year.  She presented to the hospital severe pain in the left hip after she had one of those syncopal episodes.   She was found to have left hip fracture."   Further hospital course and management as outlined below.   In summary, hospital course complicated by upper GI bleeding and acute anemia requiring blood  transfusions.   03/03/23:  pt doing well.  Hbg remained stable after transfusion with improvement above expected in Hbg.  No further signs of bleeding.  Patient is medically stable and agreeable to discharge home with [facility] PT today.     Assessment and Plan:  S/p syncope Patient reports she has been having syncopal episodes for 1 year now and has followed up with her PCP.  Unclear etiology. Pt denies prodromal symptoms prior to episodes.  Echocardiogram in October 2022 demonstrated normal LV and RV size and function.   History of paroxysmal SVTs.   --Lisinopril and chlorthalidone were held 2D echo showed EF estimated at 55 to 60%. --Recommend outpatient ambulatory monitor - follow up with PCP --Seems unlikely orthostatic hypotension without prodrome symptoms    Left intertrochanteric femur fracture (HCC) S/p intramedullary nail intertrochanteric left hip on 02/26/2023. Analgesics as needed for pain.   Follow-up with orthopedic surgeon in 2 weeks. PT recommended HH PT after d/c DVT prophylaxis is held due to GI bleed and anemia.  Pt has been mobilizing quite well and therefore at relatively less risk of DVT's.  She was counseled regarding importance of mobility.     Acute blood loss anemia Hemoglobin dropped from 11.6 >> 8.7 >> 8.4 >>7.6 >> 6.2. Transfused 1 unit pRBC's on 7/23.   Hbg improved to 8.4 >> 7.2 7/24: 2nd unit pRBC's ordered by overnight provider Post-transfusion Hbg 8.9 >> 9.9 this AM --Follow up with PCP for repeat CBC in 1-2 weeks     Hematemesis/acute upper GI bleeding EGD this AM (7/24) -- showed a gastrojejunal anastamosis ulceration with visible vessel that was cauterized. --Hold Lovenox  for now --Mgmt per GI - see their recs --Continue IV Protonix. --Discharge on Prilosec 40 mg PO BID indefinitely --Advance diet as tolerated    Hypokalemia - resolved with replacement --Monitor BMP level at follow up    Hypotension Lisinopril and chlorthalidone were  held.  BP's yesterday and today mildly elevated. --resume lisinopril at discharge --hold chlorthalidone --monitor BP's at home 2 times/day --PCP follow up in 1-2 weeks     Paroxysmal SVT (supraventricular tachycardia) EKG showed sinus tachycardia on admission     Opioid dependence on maintenance agonist therapy, no symptoms (HCC) - Continue home Suboxone     Candida esophagitis (HCC) Chart review showed recent EGD on 02/07/2023.   She was prescribed a 3-week course of fluconazole and she has taken 2 weeks worth of fluconazole thus far. --Continue fluconazole through 03/04/2023     Lung nodule Patient recently had a CT of the chest that demonstrated a new spiculated nodule of the right upper lobe concerning for primary malignancy.  Referred to oncology outpatient. PET scan scheduled on 03/11/2023  Follow-up with oncologist on 03/15/2023.     Other comorbidities are stable, including: --anxiety --hypertension --depression --mild intermittent asthma --tobacco use disorder           Consultants: Orthopedic surgery, Gastroenterology  Procedures performed: IM nail for hip fracture, EGD - see Op Notes for details   Disposition: Home health  Diet recommendation:  Cardiac diet  DISCHARGE MEDICATION: Allergies as of 03/03/2023       Reactions   Butrans [buprenorphine] Other (See Comments)   Patient states she developed a severe rash to the adhesive on the patch. She tolerates Buprenorphine    Pumpkin Flavor    Tramadol Nausea And Vomiting        Medication List     STOP taking these medications    chlorthalidone 25 MG tablet Commonly known as: HYGROTON   diclofenac sodium 1 % Gel Commonly known as: VOLTAREN   DULoxetine 30 MG capsule Commonly known as: CYMBALTA   hydrochlorothiazide 25 MG tablet Commonly known as: HYDRODIURIL   mirtazapine 30 MG tablet Commonly known as: REMERON       TAKE these medications    acetaminophen 325 MG tablet Commonly  known as: TYLENOL Take 2 tablets (650 mg total) by mouth every 6 (six) hours as needed for mild pain, fever or headache.   Buprenorphine HCl-Naloxone HCl 8-2 MG Film Place 1 Film under the tongue 3 (three) times daily.   busPIRone 15 MG tablet Commonly known as: BUSPAR Take 15 mg by mouth 3 (three) times daily.   celecoxib 100 MG capsule Commonly known as: CELEBREX Take 100 mg by mouth 2 (two) times daily.   clobetasol ointment 0.05 % Commonly known as: TEMOVATE Apply the medication twice daily to open areas or blisters on the leegs   cyanocobalamin 1000 MCG tablet Take 1,000 mcg by mouth daily.   ferrous sulfate 325 (65 FE) MG tablet Take 1 tablet (325 mg total) by mouth daily with breakfast. Start taking on: March 04, 2023   fluconazole 100 MG tablet Commonly known as: DIFLUCAN Take 1 tablet (100 mg total) by mouth daily for 2 days. Start taking on: March 04, 2023   gabapentin 300 MG capsule Commonly known as: NEURONTIN Take 900 mg by mouth 3 (three) times daily.   lisinopril 40 MG tablet Commonly known as: ZESTRIL Take 40 mg by mouth daily.   omeprazole 40 MG capsule Commonly known as: PRILOSEC Take 40 mg  by mouth 2 (two) times daily.   oxyCODONE-acetaminophen 5-325 MG tablet Commonly known as: Percocet Take 1-2 tablets by mouth every 6 (six) hours as needed for severe pain or moderate pain. What changed:  how much to take when to take this reasons to take this   senna 8.6 MG Tabs tablet Commonly known as: SENOKOT Take 1 tablet (8.6 mg total) by mouth 2 (two) times daily.   venlafaxine XR 37.5 MG 24 hr capsule Commonly known as: EFFEXOR-XR Take 150 mg by mouth daily with breakfast.               Durable Medical Equipment  (From admission, onward)           Start     Ordered   03/03/23 1446  For home use only DME Walker rolling  Once       Question Answer Comment  Walker: With 5 Inch Wheels   Patient needs a walker to treat with the  following condition S/p left hip fracture      03/03/23 1445   03/03/23 1446  For home use only DME 3 n 1  Once        03/03/23 1445   03/01/23 0000  DME Bedside commode       Question:  Patient needs a bedside commode to treat with the following condition  Answer:  Femur fracture (HCC)   03/01/23 1200              Discharge Care Instructions  (From admission, onward)           Start     Ordered   03/03/23 0000  Leave dressing on - Keep it clean, dry, and intact until clinic visit        03/03/23 1246            Follow-up Information     Deeann Saint, MD. Go on 03/18/2023.   Specialty: Orthopedic Surgery Why: For wound re-check, For re-evaluation  Appt @ 9:45 am Contact information: [address] [place] [address] [phone]         Celso Amy, PA-C. Go on 04/04/2023.   Specialty: Physician Assistant Why: Appt @ 10:15 am Contact information: [address] [place] [address] [phone]         [name], MD. Call.   Specialties: Internal Medicine, Pediatrics Why: Follow up in 1-2 weeks Contact information: [id number]; [address] [place] [address] [phone]                Discharge Exam: Filed Weights   02/26/23 0748 03/02/23 0500 03/03/23 0500  Weight: 57 kg 57.2 kg 62.6 kg   General exam: awake, alert, no acute distress HEENT: moist mucus membranes, hearing grossly normal  Respiratory system: CTAB, no wheezes, rales or rhonchi, normal respiratory effort. Cardiovascular system: normal S1/S2, RRR, no JVD, murmurs, rubs, gallops, no pedal edema.   Gastrointestinal system: soft, NT, ND, no HSM felt, +bowel sounds. Central nervous system: A&O x 4. no gross focal neurologic deficits, normal speech Extremities: moves all, no edema, normal tone Skin: dry, intact, normal temperature Psychiatry: normal mood, congruent affect, judgement and insight appear  normal   Condition at discharge: stable  The results of significant diagnostics from this hospitalization (including imaging, microbiology, ancillary and laboratory) are listed below for reference.   Imaging Studies: ECHOCARDIOGRAM COMPLETE  Result Date: 02/26/2023    ECHOCARDIOGRAM REPORT   Patient  Name:   CHRISTELLE IGOE Date of Exam: 02/26/2023 Medical Rec #:  [id number]      Height:       60.0 in Accession #:    [id number]     Weight:       125.7 lb Date of Birth:  12-29-1960      BSA:          1.532 m Patient Age:    61 years       BP:           155/80 mmHg Patient Gender: F              HR:           89 bpm. Exam Location:  [facility] Procedure: 2D Echo Indications:     Syncope R55  History:         Patient has no prior history of Echocardiogram examinations.  Sonographer:     Overton Mam RDCS, FASE Referring Phys:  [id number] Verdene Lennert Diagnosing Phys: Armanda Magic MD  Sonographer Comments: Suboptimal parasternal window. Image acquisition challenging due to respiratory motion. IMPRESSIONS  1. Left ventricular ejection fraction, by estimation, is 55 to 60%. The left ventricle has normal function. The left ventricle has no regional wall motion abnormalities. Left ventricular diastolic parameters were normal.  2. Right ventricular systolic function is normal. The right ventricular size is normal. Tricuspid regurgitation signal is inadequate for assessing PA pressure.  3. The mitral valve is degenerative. No evidence of mitral valve regurgitation. No evidence of mitral stenosis.  4. The aortic valve was not well visualized. Aortic valve regurgitation is not visualized.  5. The inferior vena cava is normal in size with greater than 50% respiratory variability, suggesting right atrial pressure of 3 mmHg. FINDINGS  Left Ventricle: Left ventricular ejection fraction, by estimation, is 55 to 60%. The left ventricle has normal function. The left ventricle has no regional wall motion abnormalities. The left  ventricular internal cavity size was normal in size. There is  no left ventricular hypertrophy. Left ventricular diastolic parameters were normal. Normal left ventricular filling pressure. Right Ventricle: The right ventricular size is normal. No increase in right ventricular wall thickness. Right ventricular systolic function is normal. Tricuspid regurgitation signal is inadequate for assessing PA pressure. Left Atrium: Left atrial size was normal in size. Right Atrium: Right atrial size was normal in size. Pericardium: There is no evidence of pericardial effusion. Mitral Valve: The mitral valve is degenerative in appearance. There is mild calcification of the mitral valve leaflet(s). Mild mitral annular calcification. No evidence of mitral valve regurgitation. No evidence of mitral valve stenosis. Tricuspid Valve: The tricuspid valve is normal in structure. Tricuspid valve regurgitation is not demonstrated. No evidence of tricuspid stenosis. Aortic Valve: The aortic valve was not well visualized. Aortic valve regurgitation is not visualized. Aortic valve peak gradient measures 8.9 mmHg. Pulmonic Valve: The pulmonic valve was normal in structure. Pulmonic valve regurgitation is not visualized. No evidence of pulmonic stenosis. Aorta: The aortic root is normal in size and structure. Venous: The inferior vena cava is normal in size with greater than 50% respiratory variability, suggesting right atrial pressure of 3 mmHg. IAS/Shunts: The interatrial septum appears to be lipomatous. No atrial level shunt detected by color flow Doppler.  LEFT VENTRICLE PLAX 2D LVIDd:         3.90 cm   Diastology LVIDs:         2.70 cm   LV e' medial:  7.83 cm/s LV PW:         1.00 cm   LV E/e' medial:  8.5 LV IVS:        0.80 cm   LV e' lateral:   11.90 cm/s LVOT diam:     1.60 cm   LV E/e' lateral: 5.6 LV SV:         33 LV SV Index:   22 LVOT Area:     2.01 cm  RIGHT VENTRICLE RV Basal diam:  2.70 cm RV S prime:     13.60 cm/s TAPSE  (M-mode): 1.6 cm LEFT ATRIUM             Index        RIGHT ATRIUM          Index LA diam:        2.70 cm 1.76 cm/m   RA Area:     8.70 cm LA Vol (A2C):   21.2 ml 13.84 ml/m  RA Volume:   16.30 ml 10.64 ml/m LA Vol (A4C):   16.3 ml 10.64 ml/m LA Biplane Vol: 18.6 ml 12.14 ml/m  AORTIC VALVE AV Area (Vmax): 1.52 cm AV Vmax:        149.00 cm/s AV Peak Grad:   8.9 mmHg LVOT Vmax:      113.00 cm/s LVOT Vmean:     69.800 cm/s LVOT VTI:       0.165 m  AORTA Ao Root diam: 2.70 cm MITRAL VALVE MV Area (PHT): 4.06 cm    SHUNTS MV Decel Time: 187 msec    Systemic VTI:  0.16 m MV E velocity: 66.60 cm/s  Systemic Diam: 1.60 cm MV A velocity: 82.30 cm/s MV E/A ratio:  0.81 Armanda Magic MD Electronically signed by Armanda Magic MD Signature Date/Time: 02/26/2023/5:14:27 PM    Final    DG HIP UNILAT WITH PELVIS 2-3 VIEWS LEFT  Result Date: 02/26/2023 CLINICAL DATA:  Elective surgery EXAM: DG HIP (WITH OR WITHOUT PELVIS) 2-3V LEFT COMPARISON:  Left hip x-ray 02/25/2023 FINDINGS: Four intraoperative fluoroscopic views of the left hip were submitted. Left femoral nail and hip screw were placed fixating intratrochanteric fracture. Alignment is anatomic. Fluoroscopy time 32 seconds. Fluoroscopy dose 3.085 micro gray. IMPRESSION: Left femoral nail and hip screw were placed fixating intratrochanteric fracture. Alignment is anatomic. Electronically Signed   By: Darliss Cheney M.D.   On: 02/26/2023 15:39   DG C-Arm 1-60 Min-No Report  Result Date: 02/26/2023 Fluoroscopy was utilized by the requesting physician.  No radiographic interpretation.   DG Chest 1 View  Result Date: 02/25/2023 CLINICAL DATA:  Trauma, fall, left femoral fracture. EXAM: CHEST  1 VIEW COMPARISON:  None Available. FINDINGS: The heart size and mediastinal contours are within normal limits. Both lungs are clear. Anterior cervical discectomy and fusion hardware. Right shoulder reverse arthroplasty. No acute osseous abnormality. IMPRESSION: No active  disease. Electronically Signed   By: Larose Hires D.O.   On: 02/25/2023 16:56   DG Hip Unilat W or Wo Pelvis 2-3 Views Left  Result Date: 02/25/2023 CLINICAL DATA:  Shortened and rotated station of the left leg. Patient reports fall due to syncope. EXAM: DG HIP (WITH OR WITHOUT PELVIS) 2-3V LEFT COMPARISON:  None Available. FINDINGS: There is displaced comminuted intertrochanteric fracture of the left femur with superolateral displacement of the distal femur. IMPRESSION: Displaced comminuted intertrochanteric fracture of the left femur. Electronically Signed   By: Larose Hires D.O.   On: 02/25/2023 16:55  Microbiology: Results for orders placed or performed during the hospital encounter of 02/25/23  Surgical pcr screen     Status: Abnormal   Collection Time: 02/26/23  7:45 AM   Specimen: Nasal Mucosa; Nasal Swab  Result Value Ref Range Status   MRSA, PCR NEGATIVE NEGATIVE Final   Staphylococcus aureus POSITIVE (A) NEGATIVE Final    Comment: (NOTE) The Xpert SA Assay (FDA approved for NASAL specimens in patients 47 years of age and older), is one component of a comprehensive surveillance program. It is not intended to diagnose infection nor to guide or monitor treatment. Performed at [facility], [address] [place], [place] [address]     Labs: CBC: Recent Labs  Lab 02/27/23 0440 02/28/23 0543 02/28/23 2229 03/01/23 0409 03/01/23 1255 03/02/23 0406 03/02/23 2006 03/03/23 0458  WBC 10.7* 10.0  --  10.9*  --  9.5  --  8.5  HGB 8.7* 8.4*   < > 6.2* 8.4* 7.2* 8.9* 9.9*  HCT 26.4* 25.5*  --  18.2*  --  21.8* 27.0* 31.1*  MCV 90.7 90.4  --  89.7  --  90.8  --  94.5  PLT 271 299  --  286  --  298  --  312   < > = values in this interval not displayed.   Basic Metabolic Panel: Recent Labs  Lab 02/25/23 1625 02/26/23 0435 02/27/23 0440 02/28/23 0543 03/01/23 0409 03/02/23 0406 03/03/23 0458  NA 136   < > 135 137 137 136 140  K 3.9   < > 3.6 3.7 3.8 3.1*  3.5  CL 101   < > 102 102 104 104 105  CO2 27   < > 25 28 27 26 27   GLUCOSE 83   < > 121* 116* 102* 85 93  BUN 26*   < > 11 12 24* 12 7  CREATININE 0.75   < > 0.54 0.53 0.52 0.51 0.47  CALCIUM 8.4*   < > 7.9* 8.0* 7.5* 7.7* 8.1*  MG 2.4  --   --   --   --   --  1.9  PHOS 4.1  --   --   --   --   --   --    < > = values in this interval not displayed.   Liver Function Tests: Recent Labs  Lab 02/26/23 0435  AST 16  ALT 17  ALKPHOS 54  BILITOT 0.3  PROT 5.3*  ALBUMIN 2.9*   CBG: Recent Labs  Lab 02/28/23 0802 03/01/23 0812 03/02/23 0450 03/02/23 0557  GLUCAP 117* 84 61* 93    Discharge time spent: less than 30 minutes.  Signed: Pennie Banter, DO [facility] 03/03/2023

## 2023-03-03 NOTE — Progress Notes (Signed)
Patient is not able to walk the distance required to go the bathroom, or he/she is unable to safely negotiate stairs required to access the bathroom.  A 3in1 BSC will alleviate this problem  

## 2023-03-03 NOTE — Plan of Care (Signed)
  Problem: Education: Goal: Knowledge of General Education information will improve Description: Including pain rating scale, medication(s)/side effects and non-pharmacologic comfort measures Outcome: Progressing   Problem: Clinical Measurements: Goal: Ability to maintain clinical measurements within normal limits will improve Outcome: Progressing Goal: Will remain free from infection Outcome: Progressing   Problem: Coping: Goal: Level of anxiety will decrease Outcome: Progressing   Problem: Pain Managment: Goal: General experience of comfort will improve Outcome: Progressing   Problem: Safety: Goal: Ability to remain free from injury will improve Outcome: Progressing

## 2023-03-03 NOTE — Progress Notes (Signed)
AVS discussed and provided to patient. IV removed. NO complaints at this time. Walker provided to patient by PT. BSC to be delivered to her house.

## 2023-03-03 NOTE — Care Management Important Message (Signed)
Important Message  Patient Details  Name: Tara Craig MRN: [id number] Date of Birth: 07/29/1961   Medicare Important Message Given:  Yes     Olegario Messier A  03/03/2023, 10:57 AM

## 2023-04-03 NOTE — Progress Notes (Deleted)
Celso Amy, PA-C [address]  [address]  [place], [place] [address]  Main: [phone]  Fax: [phone]   Primary Care Physician: [name], MD  Primary Gastroenterologist:  ***  CC: Follow-up GI bleed  HPI: Tara Craig is a 62 y.o. female presents for hospital follow-up of GI bleed.  She was admitted to [facility] 02/25/2023 until 03/03/2023.  Initially presented with a syncopal episode.  History of hypertension, paroxysmal SVT, mild asthma, opioid dependence on Suboxone.  During hospitalization she had an upper GI bleed.  Her hemoglobin dropped to 7.2.  She had a blood transfusion x 2 units PRBC.  She underwent EGD 03/02/2023 by Dr. Allegra Lai which showed gastrojejunal anastomosis ulceration with visible vessel that was cauterized.  She had Roux-en-Y gastrojejunostomy.  There were diffuse white plaques in the entire esophagus consistent with candidiasis.  She was started on IV Protonix and continued on Prilosec 40 Mg twice daily indefinitely.  Last lab 03/03/2023 improved and stable 9.9g.  Iron was low.  Vitamin B12 and folate were normal.  EGD 02/07/2023 showed esophageal candidiasis.  She was treated with fluconazole for 3 weeks.  In 2018 she saw [facility] for GI bleed and anemia send secondary to NSAID induced gastric ulcers found on EGD 05/2017.  She had a screening colonoscopy, however prep was poor.  She declined repeat colonoscopy.  Cologuard was ordered.   Current Outpatient Medications  Medication Sig Dispense Refill   acetaminophen (TYLENOL) 325 MG tablet Take 2 tablets (650 mg total) by mouth every 6 (six) hours as needed for mild pain, fever or headache.     Buprenorphine HCl-Naloxone HCl 8-2 MG FILM Place 1 Film under the tongue 3 (three) times daily.     busPIRone (BUSPAR) 15 MG tablet Take 15 mg by mouth 3 (three) times daily.     celecoxib (CELEBREX) 100 MG capsule Take 100 mg by mouth 2 (two) times daily.     clobetasol ointment (TEMOVATE) 0.05 % Apply the  medication twice daily to open areas or blisters on the leegs     cyanocobalamin 1000 MCG tablet Take 1,000 mcg by mouth daily.     ferrous sulfate 325 (65 FE) MG tablet Take 1 tablet (325 mg total) by mouth daily with breakfast.     gabapentin (NEURONTIN) 300 MG capsule Take 900 mg by mouth 3 (three) times daily.     lisinopril (ZESTRIL) 40 MG tablet Take 40 mg by mouth daily.     omeprazole (PRILOSEC) 40 MG capsule Take 40 mg by mouth 2 (two) times daily.     oxyCODONE-acetaminophen (PERCOCET) 5-325 MG tablet Take 1-2 tablets by mouth every 6 (six) hours as needed for severe pain or moderate pain. 30 tablet 0   senna (SENOKOT) 8.6 MG TABS tablet Take 1 tablet (8.6 mg total) by mouth 2 (two) times daily. 120 tablet 0   venlafaxine XR (EFFEXOR-XR) 37.5 MG 24 hr capsule Take 150 mg by mouth daily with breakfast.     No current facility-administered medications for this visit.    Allergies as of 04/04/2023 - Review Complete 03/02/2023  Allergen Reaction Noted   Butrans [buprenorphine] Other (See Comments) 02/25/2023   Pumpkin flavor  10/13/2018   Tramadol Nausea And Vomiting 09/26/2015    Past Medical History:  Diagnosis Date   Acute upper GI bleeding 03/01/2023   Arthritis    Asthma    Back pain, chronic    Bilateral foot-drop    Hypokalemia 03/03/2023  Hypotension 03/03/2023   Skin cancer     Past Surgical History:  Procedure Laterality Date   ABDOMINAL HYSTERECTOMY     APPENDECTOMY     BACK SURGERY  2010   ESOPHAGOGASTRODUODENOSCOPY (EGD) WITH PROPOFOL N/A 03/02/2023   Procedure: ESOPHAGOGASTRODUODENOSCOPY (EGD) WITH PROPOFOL;  Surgeon: Toney Reil, MD;  Location: [facility] ENDOSCOPY;  Service: Gastroenterology;  Laterality: N/A;   HEMOSTASIS CONTROL  03/02/2023   Procedure: HEMOSTASIS CONTROL;  Surgeon: Toney Reil, MD;  Location: [facility] ENDOSCOPY;  Service: Gastroenterology;;   INTRAMEDULLARY (IM) NAIL INTERTROCHANTERIC Left 02/26/2023   Procedure: INTRAMEDULLARY  (IM) NAIL INTERTROCHANTERIC;  Surgeon: Deeann Saint, MD;  Location: [facility] ORS;  Service: Orthopedics;  Laterality: Left;   TENNIS ELBOW RELEASE/NIRSCHEL PROCEDURE      Review of Systems:    All systems reviewed and negative except where noted in HPI.   Physical Examination:   There were no vitals taken for this visit.  General: Well-nourished, well-developed in no acute distress.  Lungs: Clear to auscultation bilaterally. Non-labored. Heart: Regular rate and rhythm, no murmurs rubs or gallops.  Abdomen: Bowel sounds are normal; Abdomen is Soft; No hepatosplenomegaly, masses or hernias;  No Abdominal Tenderness; No guarding or rebound tenderness. Neuro: Alert and oriented x 3.  Grossly intact.  Psych: Alert and cooperative, normal mood and affect.   Imaging Studies: No results found.  Assessment and Plan:   Tara Craig is a 62 y.o. 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  She received blood and fusion in hospital.  Currently on omeprazole twice daily indefinitely.  Also has esophageal candidiasis currently on fluconazole.  1.  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  Esophageal candidiasis  Continue fluconazole  Check HIV?  3.   Iron deficiency anemia, due to to acute GI blood loss/upper GI anastomotic ulcer  Avoid NSAIDs  Order labs CBC, iron panel, ferritin, celiac  Start Iron?  Schedule colonoscopy  Celso Amy, PA-C  Follow up ***  BP check ***

## 2023-04-04 ENCOUNTER — Ambulatory Visit: Payer: 59 | Admitting: Physician Assistant

## 2023-06-20 HISTORY — PX: LUNG SURGERY: SHX703

## 2023-07-05 ENCOUNTER — Inpatient Hospital Stay
Admission: EM | Admit: 2023-07-05 | Discharge: 2023-07-07 | DRG: 871 | Discharge status: Against Medical Advice | Payer: 59 | Attending: Internal Medicine | Admitting: Internal Medicine

## 2023-07-05 ENCOUNTER — Other Ambulatory Visit: Payer: Self-pay

## 2023-07-05 ENCOUNTER — Emergency Department: Payer: 59

## 2023-07-05 DIAGNOSIS — D75839 Thrombocytosis, unspecified: Secondary | ICD-10-CM

## 2023-07-05 DIAGNOSIS — I471 Supraventricular tachycardia, unspecified: Secondary | ICD-10-CM | POA: Diagnosis present

## 2023-07-05 DIAGNOSIS — F17200 Nicotine dependence, unspecified, uncomplicated: Secondary | ICD-10-CM | POA: Diagnosis present

## 2023-07-05 DIAGNOSIS — Z5329 Procedure and treatment not carried out because of patient's decision for other reasons: Secondary | ICD-10-CM | POA: Diagnosis not present

## 2023-07-05 DIAGNOSIS — T8130XA Disruption of wound, unspecified, initial encounter: Secondary | ICD-10-CM | POA: Diagnosis present

## 2023-07-05 DIAGNOSIS — Z885 Allergy status to narcotic agent status: Secondary | ICD-10-CM

## 2023-07-05 DIAGNOSIS — E876 Hypokalemia: Secondary | ICD-10-CM | POA: Diagnosis present

## 2023-07-05 DIAGNOSIS — S2231XA Fracture of one rib, right side, initial encounter for closed fracture: Secondary | ICD-10-CM

## 2023-07-05 DIAGNOSIS — Z85828 Personal history of other malignant neoplasm of skin: Secondary | ICD-10-CM | POA: Diagnosis not present

## 2023-07-05 DIAGNOSIS — I10 Essential (primary) hypertension: Secondary | ICD-10-CM | POA: Diagnosis present

## 2023-07-05 DIAGNOSIS — E785 Hyperlipidemia, unspecified: Secondary | ICD-10-CM | POA: Diagnosis present

## 2023-07-05 DIAGNOSIS — Z902 Acquired absence of lung [part of]: Secondary | ICD-10-CM

## 2023-07-05 DIAGNOSIS — R55 Syncope and collapse: Secondary | ICD-10-CM | POA: Diagnosis present

## 2023-07-05 DIAGNOSIS — J4520 Mild intermittent asthma, uncomplicated: Secondary | ICD-10-CM | POA: Diagnosis present

## 2023-07-05 DIAGNOSIS — J948 Other specified pleural conditions: Secondary | ICD-10-CM | POA: Insufficient documentation

## 2023-07-05 DIAGNOSIS — F1120 Opioid dependence, uncomplicated: Secondary | ICD-10-CM | POA: Diagnosis present

## 2023-07-05 DIAGNOSIS — Z90710 Acquired absence of both cervix and uterus: Secondary | ICD-10-CM

## 2023-07-05 DIAGNOSIS — F32A Depression, unspecified: Secondary | ICD-10-CM | POA: Diagnosis present

## 2023-07-05 DIAGNOSIS — F419 Anxiety disorder, unspecified: Secondary | ICD-10-CM | POA: Diagnosis present

## 2023-07-05 DIAGNOSIS — Z888 Allergy status to other drugs, medicaments and biological substances status: Secondary | ICD-10-CM

## 2023-07-05 DIAGNOSIS — Y95 Nosocomial condition: Secondary | ICD-10-CM | POA: Diagnosis present

## 2023-07-05 DIAGNOSIS — M549 Dorsalgia, unspecified: Secondary | ICD-10-CM | POA: Diagnosis present

## 2023-07-05 DIAGNOSIS — G8929 Other chronic pain: Secondary | ICD-10-CM | POA: Diagnosis present

## 2023-07-05 DIAGNOSIS — A419 Sepsis, unspecified organism: Principal | ICD-10-CM | POA: Diagnosis present

## 2023-07-05 DIAGNOSIS — Z9181 History of falling: Secondary | ICD-10-CM

## 2023-07-05 DIAGNOSIS — Z1152 Encounter for screening for COVID-19: Secondary | ICD-10-CM | POA: Diagnosis not present

## 2023-07-05 DIAGNOSIS — S2239XD Fracture of one rib, unspecified side, subsequent encounter for fracture with routine healing: Secondary | ICD-10-CM

## 2023-07-05 DIAGNOSIS — Z9109 Other allergy status, other than to drugs and biological substances: Secondary | ICD-10-CM

## 2023-07-05 DIAGNOSIS — J189 Pneumonia, unspecified organism: Principal | ICD-10-CM | POA: Diagnosis present

## 2023-07-05 DIAGNOSIS — Z9884 Bariatric surgery status: Secondary | ICD-10-CM

## 2023-07-05 DIAGNOSIS — Z87898 Personal history of other specified conditions: Secondary | ICD-10-CM

## 2023-07-05 DIAGNOSIS — I4719 Other supraventricular tachycardia: Secondary | ICD-10-CM | POA: Diagnosis present

## 2023-07-05 DIAGNOSIS — J90 Pleural effusion, not elsewhere classified: Secondary | ICD-10-CM | POA: Diagnosis present

## 2023-07-05 DIAGNOSIS — F17210 Nicotine dependence, cigarettes, uncomplicated: Secondary | ICD-10-CM | POA: Diagnosis present

## 2023-07-05 DIAGNOSIS — Z8249 Family history of ischemic heart disease and other diseases of the circulatory system: Secondary | ICD-10-CM

## 2023-07-05 DIAGNOSIS — Z8719 Personal history of other diseases of the digestive system: Secondary | ICD-10-CM

## 2023-07-05 DIAGNOSIS — Z8711 Personal history of peptic ulcer disease: Secondary | ICD-10-CM | POA: Diagnosis not present

## 2023-07-05 DIAGNOSIS — T8131XA Disruption of external operation (surgical) wound, not elsewhere classified, initial encounter: Secondary | ICD-10-CM

## 2023-07-05 DIAGNOSIS — Z9889 Other specified postprocedural states: Secondary | ICD-10-CM

## 2023-07-05 DIAGNOSIS — Z79899 Other long term (current) drug therapy: Secondary | ICD-10-CM

## 2023-07-05 LAB — COMPREHENSIVE METABOLIC PANEL
ALT: 11 U/L (ref 0–44)
AST: 14 U/L — ABNORMAL LOW (ref 15–41)
Albumin: 2.7 g/dL — ABNORMAL LOW (ref 3.5–5.0)
Alkaline Phosphatase: 93 U/L (ref 38–126)
Anion gap: 9 (ref 5–15)
BUN: 18 mg/dL (ref 8–23)
CO2: 26 mmol/L (ref 22–32)
Calcium: 8.1 mg/dL — ABNORMAL LOW (ref 8.9–10.3)
Chloride: 101 mmol/L (ref 98–111)
Creatinine, Ser: 0.53 mg/dL (ref 0.44–1.00)
GFR, Estimated: >60 mL/min (ref >60)
Glucose, Bld: 84 mg/dL (ref 70–99)
Potassium: 3.2 mmol/L — ABNORMAL LOW (ref 3.5–5.1)
Sodium: 136 mmol/L (ref 135–145)
Total Bilirubin: 0.4 mg/dL (ref <1.2)
Total Protein: 6.2 g/dL — ABNORMAL LOW (ref 6.5–8.1)

## 2023-07-05 LAB — APTT: aPTT: 31 s (ref 24–36)

## 2023-07-05 LAB — LIPASE, BLOOD: Lipase: 25 U/L (ref 11–51)

## 2023-07-05 LAB — CBC WITH DIFFERENTIAL/PLATELET
Abs Immature Granulocytes: 0.1 10*3/uL — ABNORMAL HIGH (ref 0.00–0.07)
Basophils Absolute: 0.1 10*3/uL (ref 0.0–0.1)
Basophils Relative: 1 %
Eosinophils Absolute: 0.4 10*3/uL (ref 0.0–0.5)
Eosinophils Relative: 3 %
HCT: 31 % — ABNORMAL LOW (ref 36.0–46.0)
Hemoglobin: 9.9 g/dL — ABNORMAL LOW (ref 12.0–15.0)
Immature Granulocytes: 1 %
Lymphocytes Relative: 10 %
Lymphs Abs: 1.5 10*3/uL (ref 0.7–4.0)
MCH: 30.5 pg (ref 26.0–34.0)
MCHC: 31.9 g/dL (ref 30.0–36.0)
MCV: 95.4 fL (ref 80.0–100.0)
Monocytes Absolute: 0.8 10*3/uL (ref 0.1–1.0)
Monocytes Relative: 5 %
Neutro Abs: 12.1 10*3/uL — ABNORMAL HIGH (ref 1.7–7.7)
Neutrophils Relative %: 80 %
Platelets: 759 10*3/uL — ABNORMAL HIGH (ref 150–400)
RBC: 3.25 MIL/uL — ABNORMAL LOW (ref 3.87–5.11)
RDW: 13.6 % (ref 11.5–15.5)
WBC: 15.1 10*3/uL — ABNORMAL HIGH (ref 4.0–10.5)
nRBC: 0 % (ref 0.0–0.2)

## 2023-07-05 LAB — PROTIME-INR
INR: 1 (ref 0.8–1.2)
Prothrombin Time: 13.7 s (ref 11.4–15.2)

## 2023-07-05 LAB — LACTIC ACID, PLASMA: Lactic Acid, Venous: 0.9 mmol/L (ref 0.5–1.9)

## 2023-07-05 LAB — TROPONIN I (HIGH SENSITIVITY): Troponin I (High Sensitivity): 7 ng/L (ref <18)

## 2023-07-05 LAB — RESP PANEL BY RT-PCR (RSV, FLU A&B, COVID)  RVPGX2
Influenza A by PCR: NEGATIVE
Influenza B by PCR: NEGATIVE
Resp Syncytial Virus by PCR: NEGATIVE
SARS Coronavirus 2 by RT PCR: NEGATIVE

## 2023-07-05 MED ORDER — LISINOPRIL 20 MG PO TABS
40.0000 mg | ORAL_TABLET | Freq: Every day | ORAL | Status: DC
  Administered 2023-07-06 – 2023-07-07 (×2): 40 mg via ORAL
  Filled 2023-07-05: qty 2
  Filled 2023-07-05: qty 4

## 2023-07-05 MED ORDER — BUSPIRONE HCL 10 MG PO TABS
15.0000 mg | ORAL_TABLET | Freq: Three times a day (TID) | ORAL | Status: DC
  Administered 2023-07-06 – 2023-07-07 (×4): 15 mg via ORAL
  Filled 2023-07-05 (×2): qty 3
  Filled 2023-07-05 (×2): qty 2

## 2023-07-05 MED ORDER — ONDANSETRON HCL 4 MG PO TABS: 4.0000 mg | ORAL_TABLET | Freq: Four times a day (QID) | ORAL | Status: DC | PRN

## 2023-07-05 MED ORDER — PANTOPRAZOLE SODIUM 40 MG PO TBEC
40.0000 mg | DELAYED_RELEASE_TABLET | Freq: Every day | ORAL | Status: DC
  Administered 2023-07-06 – 2023-07-07 (×2): 40 mg via ORAL
  Filled 2023-07-05 (×2): qty 1

## 2023-07-05 MED ORDER — LACTATED RINGERS IV SOLN
150.0000 mL/h | INTRAVENOUS | Status: DC
  Administered 2023-07-06 (×2): 150 mL/h via INTRAVENOUS

## 2023-07-05 MED ORDER — IOHEXOL 300 MG/ML  SOLN
100.0000 mL | Freq: Once | INTRAMUSCULAR | Status: AC | PRN
  Administered 2023-07-05: 100 mL via INTRAVENOUS

## 2023-07-05 MED ORDER — ACETAMINOPHEN 650 MG RE SUPP: 650.0000 mg | Freq: Four times a day (QID) | RECTAL | Status: DC | PRN

## 2023-07-05 MED ORDER — ACETAMINOPHEN 325 MG PO TABS
650.0000 mg | ORAL_TABLET | Freq: Four times a day (QID) | ORAL | Status: DC | PRN
  Administered 2023-07-06 – 2023-07-07 (×4): 650 mg via ORAL
  Filled 2023-07-05 (×4): qty 2

## 2023-07-05 MED ORDER — VANCOMYCIN HCL IN DEXTROSE 1-5 GM/200ML-% IV SOLN
1000.0000 mg | Freq: Once | INTRAVENOUS | Status: AC
  Administered 2023-07-05: 1000 mg via INTRAVENOUS
  Filled 2023-07-05: qty 200

## 2023-07-05 MED ORDER — SODIUM CHLORIDE 0.9% FLUSH
10.0000 mL | Freq: Two times a day (BID) | INTRAVENOUS | Status: DC
  Administered 2023-07-05 – 2023-07-07 (×4): 10 mL via INTRAVENOUS

## 2023-07-05 MED ORDER — METRONIDAZOLE 500 MG/100ML IV SOLN
500.0000 mg | Freq: Once | INTRAVENOUS | Status: AC
  Administered 2023-07-05: 500 mg via INTRAVENOUS
  Filled 2023-07-05: qty 100

## 2023-07-05 MED ORDER — ONDANSETRON HCL 4 MG/2ML IJ SOLN: 4.0000 mg | Freq: Four times a day (QID) | INTRAMUSCULAR | Status: DC | PRN

## 2023-07-05 MED ORDER — VENLAFAXINE HCL ER 150 MG PO CP24
150.0000 mg | ORAL_CAPSULE | Freq: Every day | ORAL | Status: DC
  Administered 2023-07-06 – 2023-07-07 (×2): 150 mg via ORAL
  Filled 2023-07-05 (×3): qty 1

## 2023-07-05 MED ORDER — ACETAMINOPHEN 500 MG PO TABS
1000.0000 mg | ORAL_TABLET | Freq: Once | ORAL | Status: AC
  Administered 2023-07-05: 1000 mg via ORAL
  Filled 2023-07-05: qty 2

## 2023-07-05 MED ORDER — SODIUM CHLORIDE 0.9 % IV SOLN
2.0000 g | Freq: Once | INTRAVENOUS | Status: AC
  Administered 2023-07-05: 2 g via INTRAVENOUS
  Filled 2023-07-05: qty 12.5

## 2023-07-05 MED ORDER — ENOXAPARIN SODIUM 40 MG/0.4ML IJ SOSY: 40.0000 mg | PREFILLED_SYRINGE | INTRAMUSCULAR | Status: DC

## 2023-07-05 MED ORDER — MORPHINE SULFATE (PF) 4 MG/ML IV SOLN
4.0000 mg | Freq: Once | INTRAVENOUS | Status: AC
  Administered 2023-07-05: 4 mg via INTRAVENOUS
  Filled 2023-07-05: qty 1

## 2023-07-05 NOTE — Assessment & Plan Note (Signed)
Chronic pain Continue Suboxone and multimodal pain management

## 2023-07-05 NOTE — Progress Notes (Signed)
ED Pharmacy Antibiotic Sign Off An antibiotic consult was received from an ED provider for vancomycin and cefepime per pharmacy dosing for an infection of unknown source. A chart review was completed to assess appropriateness.   The following one time order(s) were placed:  Vanc 1 g IV x 1 Cefepime 2 g IV x 1  Further antibiotic and/or antibiotic pharmacy consults should be ordered by the admitting provider if indicated.   Thank you for allowing pharmacy to be a part of this patient's care.   Merryl Hacker, [facility]  Clinical Pharmacist 07/05/23 10:01 PM

## 2023-07-05 NOTE — ED Provider Notes (Signed)
[facility] Provider Note    Event Date/Time   First MD Initiated Contact with Patient 07/05/23 1902     (approximate)   History   Abdominal Pain   HPI Tara Craig is a 62 y.o. female with history of HTN, HLD, tobacco abuse, paroxysmal SVT, recent right thoracotomy with right upper lobectomy presenting today for abdominal pain.  Patient states she saw her surgical oncologist today where they remove the stitches from what her prior chest tube site was at.  She was drinking coffee when she had a bout of coughing which caused blood to come out of the site.  She has had pain at that site since.  Also complaining of abdominal pain.  No other nausea, vomiting, shortness of breath, chest pain, diarrhea, constipation.  Reviewed chart notes from surgical oncology visit today.  Evaluated following removal of chest tube site sutures.  There has been interval increase in right-sided pleural effusion with plan for thoracentesis next week.     Physical Exam   Triage Vital Signs: ED Triage Vitals  Encounter Vitals Group     BP 07/05/23 1837 (!) 164/98     Systolic BP Percentile --      Diastolic BP Percentile --      Pulse Rate 07/05/23 1837 91     Resp 07/05/23 1837 18     Temp 07/05/23 1837 (!) 100.6 F (38.1 C)     Temp Source 07/05/23 1837 Oral     SpO2 07/05/23 1837 99 %     Weight 07/05/23 1841 112 lb (50.8 kg)     Height 07/05/23 1841 5' (1.524 m)     Head Circumference --      Peak Flow --      Pain Score 07/05/23 1841 6     Pain Loc --      Pain Education --      Exclude from Growth Chart --     Most recent vital signs: Vitals:   07/05/23 2030 07/05/23 2157  BP: 137/88   Pulse: 89   Resp:    Temp:  98.8 F (37.1 C)  SpO2: 98%    Physical Exam: I have reviewed the vital signs and nursing notes. General: Awake, alert, no acute distress.  Nontoxic appearing. Head:  Atraumatic, normocephalic.   ENT:  EOM intact, PERRL. Oral mucosa is pink  and moist with no lesions. Neck: Neck is supple with full range of motion, No meningeal signs. Cardiovascular:  RRR, No murmurs. Peripheral pulses palpable and equal bilaterally. Respiratory:  Symmetrical chest wall expansion.  No rhonchi, rales, or wheezes.  Good air movement throughout.  No use of accessory muscles.   Musculoskeletal:  No cyanosis or edema. Moving extremities with full ROM Abdomen:  Soft, tenderness to palpation throughout abdomen but most prominent in right upper quadrant epigastric, nondistended. Neuro:  GCS 15, moving all four extremities, interacting appropriately. Speech clear. Psych:  Calm, appropriate.   Skin:  Warm, dry, no rash.  Prior incisional site to right thoracic side from chest tube is open and measures approximately 2 cm with slight pus around the area but no other erythema   ED Results / Procedures / Treatments   Labs (all labs ordered are listed, but only abnormal results are displayed) Labs Reviewed  CBC WITH DIFFERENTIAL/PLATELET - Abnormal; Notable for the following components:      Result Value   WBC 15.1 (*)    RBC 3.25 (*)    Hemoglobin  9.9 (*)    HCT 31.0 (*)    Platelets 759 (*)    Neutro Abs 12.1 (*)    Abs Immature Granulocytes 0.10 (*)    All other components within normal limits  COMPREHENSIVE METABOLIC PANEL - Abnormal; Notable for the following components:   Potassium 3.2 (*)    Calcium 8.1 (*)    Total Protein 6.2 (*)    Albumin 2.7 (*)    AST 14 (*)    All other components within normal limits  RESP PANEL BY RT-PCR (RSV, FLU A&B, COVID)  RVPGX2  CULTURE, BLOOD (ROUTINE X 2)  CULTURE, BLOOD (ROUTINE X 2)  LIPASE, BLOOD  LACTIC ACID, PLASMA  LACTIC ACID, PLASMA  PROTIME-INR  APTT  URINALYSIS, W/ REFLEX TO CULTURE (INFECTION SUSPECTED)  TROPONIN I (HIGH SENSITIVITY)     EKG My EKG interpretation: Rate of 89, normal sinus rhythm, normal axis, normal intervals.  No acute ST elevations or  depressions   RADIOLOGY Independently interpreted CT chest/abdomen/pelvis showing moderate right hydropneumothorax as well as concerns for multifocal pneumonia.  Radiologist also noted acute right fifth lateral rib fracture.  No abscess alongside of chest tube.   PROCEDURES:  Critical Care performed: Yes, see critical care procedure note(s)  .Critical Care  Performed by: Janith Lima, MD Authorized by: Janith Lima, MD   Critical care provider statement:    Critical care time (minutes):  30   Critical care was necessary to treat or prevent imminent or life-threatening deterioration of the following conditions:  Sepsis   Critical care was time spent personally by me on the following activities:  Development of treatment plan with patient or surrogate, discussions with consultants, evaluation of patient's response to treatment, examination of patient, ordering and review of laboratory studies, ordering and review of radiographic studies, ordering and performing treatments and interventions, pulse oximetry, re-evaluation of patient's condition and review of old charts   I assumed direction of critical care for this patient from another provider in my specialty: no     Care discussed with: admitting provider      MEDICATIONS ORDERED IN ED: Medications  sodium chloride flush (NS) 0.9 % injection 10 mL (10 mLs Intravenous Given 07/05/23 2104)  ceFEPIme (MAXIPIME) 2 g in sodium chloride 0.9 % 100 mL IVPB (has no administration in time range)  metroNIDAZOLE (FLAGYL) IVPB 500 mg (has no administration in time range)  vancomycin (VANCOCIN) IVPB 1000 mg/200 mL premix (has no administration in time range)  morphine (PF) 4 MG/ML injection 4 mg (4 mg Intravenous Given 07/05/23 1944)  acetaminophen (TYLENOL) tablet 1,000 mg (1,000 mg Oral Given 07/05/23 2059)  iohexol (OMNIPAQUE) 300 MG/ML solution 100 mL (100 mLs Intravenous Contrast Given 07/05/23 2039)     IMPRESSION / MDM / ASSESSMENT  AND PLAN / ED COURSE  I reviewed the triage vital signs and the nursing notes.                              Differential diagnosis includes, but is not limited to, pneumonia, pneumothorax, hemothorax, soft tissue abscess, cholecystitis  Patient's presentation is most consistent with acute presentation with potential threat to life or bodily function.  Patient is a 62 year old female with recent history of lobectomy and chest tube presenting today for worsening right-sided chest pain and abdominal pain.  Patient is febrile on arrival with tachycardia meeting sepsis criteria.  WBC elevated at 15.1 concerning for infection.  Blood cultures  and lactic were obtained.  Patient started on broad-spectrum antibiotics.  No overt tachycardia or hypotension necessitating fluid resuscitation at this time.  CT chest/abdomen/pelvis ordered for further evaluation.  This showed evidence of multifocal pneumonia which is the likely source of her sepsis.  Lactic acid not elevated.  Also noted right sided hydropneumothorax which appeared consistent with a chest x-ray she had earlier today.  The plan at that time was for thoracentesis next week given she was not having any respiratory distress.  Plan to admit to hospitalist for multifocal pneumonia with sepsis.  Hospitalist asked me to consult CT surgery given the hydropneumothorax.  Discussed the case with Dr. Cliffton Asters at [facility].  No indication for chest tube at this time given that this is more chronic in nature and is likely safe for thoracentesis tomorrow.  Hospitalist agreed to admit patient for further care at this time.  The patient is on the cardiac monitor to evaluate for evidence of arrhythmia and/or significant heart rate changes. Clinical Course as of 07/05/23 2221  Tue Jul 05, 2023  1958 WBC(!): 15.1 [DW]  2208 CT CHEST ABDOMEN PELVIS W CONTRAST Multifocal pneumonia.  No obvious abscess collection.  Given that patient is meeting sepsis criteria, will  admit for further care on antibiotics. [DW]    Clinical Course User Index [DW] Janith Lima, MD     FINAL CLINICAL IMPRESSION(S) / ED DIAGNOSES   Final diagnoses:  Multifocal pneumonia  Sepsis, due to unspecified organism, unspecified whether acute organ dysfunction present [facility])  Closed fracture of one rib of right side, initial encounter  Hydropneumothorax     Rx / DC Orders   ED Discharge Orders     None        Note:  This document was prepared using Dragon voice recognition software and may include unintentional dictation errors.   Janith Lima, MD 07/05/23 2300

## 2023-07-05 NOTE — Assessment & Plan Note (Signed)
Continue lisinopril

## 2023-07-05 NOTE — Assessment & Plan Note (Signed)
No acute issues Hemoglobin at baseline

## 2023-07-05 NOTE — Assessment & Plan Note (Signed)
No acute issues at this time

## 2023-07-05 NOTE — Assessment & Plan Note (Addendum)
SIRS, possible sepsis Right moderate hydropneumothorax s/p RUL lobectomy 06/20/23 Patient with cough, fever, tachycardia, leukocytosis, normal lactic acid Cefepime and vancomycin Antitussives, DuoNebs as needed Incentive spirometer Supplemental oxygen if needed IR consulted for thoracentesis Will get serial chest x-ray

## 2023-07-05 NOTE — Assessment & Plan Note (Signed)
Continue venlafaxine and buspirone

## 2023-07-05 NOTE — Assessment & Plan Note (Signed)
No acute issues.

## 2023-07-05 NOTE — Sepsis Progress Note (Signed)
Notified provider of need to order antibiotics.

## 2023-07-05 NOTE — Assessment & Plan Note (Signed)
Continue home inhalers with DuoNebs as needed

## 2023-07-05 NOTE — Assessment & Plan Note (Signed)
-  Nicotine patch 

## 2023-07-05 NOTE — H&P (Incomplete)
History and Physical    Patient: Tara Craig ZOX:[id number] DOB: [birth year] DOA: 07/05/2023 DOS: the patient was seen and examined on 07/05/2023 PCP: [name], MD  Patient coming from: Home  Chief Complaint:  Chief Complaint  Patient presents with   Abdominal Pain    HPI: Tara Craig is a 62 y.o. 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  Patient states after she returned home from having the stitches removed, she had a coughing spell and then felt liquid squirting from the wound on her chest.   Patient states the fluid soaked her clothing spilling onto the floor to she had to mop it up: She has associated right-sided chest pain.  She denies shortness of breath.  She endorses having an ongoing cough and fevers at home.  Chest x-ray done during her oncology visit did show a hydrothorax and she was scheduled for nonemergent thoracentesis.   ED course and data review: Temperature 100.6 with pulse 91, BP 164/98 and O2 sat 99% on room air. Labs notable for WBC 15,000 with lactic acid 0.9 Respiratory viral panel negative for COVID, flu and RSV. Hemoglobin at baseline at 9.9.  Thrombocytosis of 759(had platelet pheresis at [facility] 11/11) Potassium 3.2 Lipase and LFTs unremarkable EKG, personally viewed and interpreted showing sinus at 89 with no acute ST-T wave changes CT chest abdomen and pelvis showing moderate right hydropneumothorax as well as findings  concerning for multifocal pneumonia and an acute right fifth lateral rib fracture as detailed below IMPRESSION: 1. Status post right upper lobectomy. Moderate right hydropneumothorax, described in and outside chest x-ray report but images not available for direct comparison. 2. Widespread bilateral foci of irregular ground-glass disease, suspect for multifocal pneumonia. 3. Acute mildly displaced right fifth lateral rib fracture. 4. Small gas and fluid collection within the posterolateral, inferior right chest wall soft tissues tracking towards the skin presumably due to prior chest tube placement. Some fluid and gas but no sizable organized abscess. 5. No CT evidence for acute intra-abdominal or pelvic abnormality. 6. Aortic atherosclerosis.  The patient was started on cefepime vancomycin and metronidazole and given morphine for pain Hospitalist consulted for admission and I requested cardiothoracic opinion given hydropneumothorax. EDP, Dr. Anner Crete subsequently spoke with CT surgery at [facility], Dr. Cliffton Asters who advised no need for urgent chest tube as patient is not in respiratory distress and not hypoxic and hemodynamically stable.     Review of Systems: As mentioned in the history of present illness. All other systems reviewed and are negative.  Past Medical History:  Diagnosis Date   Acute upper GI bleeding 03/01/2023   Arthritis    Asthma    Back pain, chronic    Bilateral foot-drop    Hypokalemia 03/03/2023   Hypotension 03/03/2023   Skin cancer    Past Surgical History:  Procedure Laterality Date   ABDOMINAL HYSTERECTOMY     ANKLE SURGERY Right    APPENDECTOMY     BACK SURGERY  08/09/2008   ESOPHAGOGASTRODUODENOSCOPY (EGD) WITH PROPOFOL N/A 03/02/2023   Procedure: ESOPHAGOGASTRODUODENOSCOPY (EGD) WITH  PROPOFOL;  Surgeon: Toney Reil, MD;  Location: [facility] ENDOSCOPY;  Service: Gastroenterology;  Laterality: N/A;   HEMOSTASIS CONTROL  03/02/2023   Procedure:  HEMOSTASIS CONTROL;  Surgeon: Toney Reil, MD;  Location: [facility] ENDOSCOPY;  Service: Gastroenterology;;   INTRAMEDULLARY (IM) NAIL INTERTROCHANTERIC Left 02/26/2023   Procedure: INTRAMEDULLARY (IM) NAIL INTERTROCHANTERIC;  Surgeon: Deeann Saint, MD;  Location: [facility] ORS;  Service: Orthopedics;  Laterality: Left;   LUNG SURGERY Right 06/20/2023   TENNIS ELBOW RELEASE/NIRSCHEL PROCEDURE     Social History:  reports that she has been smoking cigarettes. She has never used smokeless tobacco. She reports current drug use. She reports that she does not drink alcohol.  Allergies  Allergen Reactions   Butrans [Buprenorphine] Other (See Comments)    Patient states she developed a severe rash to the adhesive on the patch. She tolerates Buprenorphine    Pumpkin Flavor    Tramadol Nausea And Vomiting    Family History  Problem Relation Age of Onset   Cancer Mother        lung   Heart disease Father     Prior to Admission medications   Medication Sig Start Date End Date Taking? Authorizing Provider  acetaminophen (TYLENOL) 325 MG tablet Take 2 tablets (650 mg total) by mouth every 6 (six) hours as needed for mild pain, fever or headache. 03/03/23   Esaw Grandchild A, DO  busPIRone (BUSPAR) 15 MG tablet Take 15 mg by mouth 3 (three) times daily.    [provider]  clobetasol ointment (TEMOVATE) 0.05 % Apply the medication twice daily to open areas or blisters on the leegs 01/11/23   [provider]  cyanocobalamin 1000 MCG tablet Take 1,000 mcg by mouth daily.    [provider]  ferrous sulfate 325 (65 FE) MG tablet Take 1 tablet (325 mg total) by mouth daily with breakfast. 03/04/23   Esaw Grandchild A, DO  gabapentin (NEURONTIN) 300 MG capsule Take 900 mg by mouth 3 (three) times daily.    [name], MD  lisinopril (ZESTRIL) 40 MG tablet Take 40 mg by mouth daily. 11/23/22   [provider]  omeprazole (PRILOSEC) 40 MG capsule Take 40 mg by  mouth 2 (two) times daily.    [provider]  oxyCODONE-acetaminophen (PERCOCET) 5-325 MG tablet Take 1-2 tablets by mouth every 6 (six) hours as needed for severe pain or moderate pain. 03/03/23 03/02/24  Pennie Banter, DO  senna (SENOKOT) 8.6 MG TABS tablet Take 1 tablet (8.6 mg total) by mouth 2 (two) times daily. 03/03/23   Pennie Banter, DO  venlafaxine XR (EFFEXOR-XR) 37.5 MG 24 hr capsule Take 150 mg by mouth daily with breakfast. 01/11/23   [provider]    Physical Exam: Vitals:   07/05/23 1837 07/05/23 1841 07/05/23 2030 07/05/23 2157  BP: (!) 164/98  137/88   Pulse: 91  89   Resp: 18     Temp: (!) 100.6 F (38.1 C)   98.8 F (37.1 C)  TempSrc: Oral   Oral  SpO2: 99%  98%   Weight:  50.8 kg    Height:  5' (1.524 m)     Physical Exam Vitals and nursing note reviewed.  Constitutional:      General: She is not in acute distress.    Comments: Frail-appearing female in no acute distress  HENT:     Head: Normocephalic and atraumatic.  Cardiovascular:     Rate and Rhythm: Normal rate and  regular rhythm.     Heart sounds: Normal heart sounds.  Pulmonary:     Effort: Pulmonary effort is normal.     Breath sounds: Normal breath sounds.  Chest:     Comments: Wound dehiscence Bandage currently dry.  No active draining seen Patient's clothing damp (states from prior drainage) See pic below Abdominal:     Palpations: Abdomen is soft.     Tenderness: There is no abdominal tenderness.  Neurological:     Mental Status: Mental status is at baseline.        Labs on Admission: I have personally reviewed following labs and imaging studies  CBC: Recent Labs  Lab 07/05/23 1944  WBC 15.1*  NEUTROABS 12.1*  HGB 9.9*  HCT 31.0*  MCV 95.4  PLT 759*   Basic Metabolic Panel: Recent Labs  Lab 07/05/23 1944  NA 136  K 3.2*  CL 101  CO2 26  GLUCOSE 84  BUN 18  CREATININE 0.53  CALCIUM 8.1*   GFR: Estimated Creatinine Clearance: 53 mL/min  (by C-G formula based on SCr of 0.53 mg/dL). Liver Function Tests: Recent Labs  Lab 07/05/23 1944  AST 14*  ALT 11  ALKPHOS 93  BILITOT 0.4  PROT 6.2*  ALBUMIN 2.7*   Recent Labs  Lab 07/05/23 1944  LIPASE 25   No results for input(s): "AMMONIA" in the last 168 hours. Coagulation Profile: Recent Labs  Lab 07/05/23 1944  INR 1.0   Cardiac Enzymes: No results for input(s): "CKTOTAL", "CKMB", "CKMBINDEX", "TROPONINI" in the last 168 hours. BNP (last 3 results) No results for input(s): "PROBNP" in the last 8760 hours. HbA1C: No results for input(s): "HGBA1C" in the last 72 hours. CBG: No results for input(s): "GLUCAP" in the last 168 hours. Lipid Profile: No results for input(s): "CHOL", "HDL", "LDLCALC", "TRIG", "CHOLHDL", "LDLDIRECT" in the last 72 hours. Thyroid Function Tests: No results for input(s): "TSH", "T4TOTAL", "FREET4", "T3FREE", "THYROIDAB" in the last 72 hours. Anemia Panel: No results for input(s): "VITAMINB12", "FOLATE", "FERRITIN", "TIBC", "IRON", "RETICCTPCT" in the last 72 hours. Urine analysis:    Component Value Date/Time   COLORURINE YELLOW (A) 02/25/2023 1957   APPEARANCEUR CLEAR (A) 02/25/2023 1957   APPEARANCEUR Cloudy 05/02/2012 2133   LABSPEC 1.010 02/25/2023 1957   LABSPEC 1.028 05/02/2012 2133   PHURINE 5.0 02/25/2023 1957   GLUCOSEU NEGATIVE 02/25/2023 1957   GLUCOSEU Negative 05/02/2012 2133   HGBUR NEGATIVE 02/25/2023 1957   BILIRUBINUR NEGATIVE 02/25/2023 1957   BILIRUBINUR Negative 05/02/2012 2133   KETONESUR NEGATIVE 02/25/2023 1957   PROTEINUR NEGATIVE 02/25/2023 1957   NITRITE NEGATIVE 02/25/2023 1957   LEUKOCYTESUR NEGATIVE 02/25/2023 1957   LEUKOCYTESUR Negative 05/02/2012 2133    Radiological Exams on Admission: CT CHEST ABDOMEN PELVIS W CONTRAST  Result Date: 07/05/2023 CLINICAL DATA:  Right-sided chest pain recent chest tube removal stitches opened up pus drainage recent lobectomy EXAM: CT CHEST, ABDOMEN, AND PELVIS  WITH CONTRAST TECHNIQUE: Multidetector CT imaging of the chest, abdomen and pelvis was performed following the standard protocol during bolus administration of intravenous contrast. RADIATION DOSE REDUCTION: This exam was performed according to the departmental dose-optimization program which includes automated exposure control, adjustment of the mA and/or kV according to patient size and/or use of iterative reconstruction technique. CONTRAST:  OMNIPAQUE IOHEXOL 300 MG/ML  SOLN COMPARISON:  Outside chest x-ray imaging report 06/28/2023 FINDINGS: CT CHEST FINDINGS Cardiovascular: Mild aortic atherosclerosis. No aneurysm. Normal cardiac size. Coronary vascular calcification. No pericardial effusion. Mediastinum/Nodes: Midline trachea. No suspicious thyroid  mass. Esophagus within normal limits. Lungs/Pleura: Status post right upper lobectomy. Moderate right hydropneumothorax. Sutures and bandlike soft tissue density in the superior right middle lobe presumably postsurgical change. Widespread bilateral foci of irregular ground-glass disease. Musculoskeletal: Sternum appears intact. Right 6 partial posterior rib resection. Acute mildly displaced right fifth lateral rib fracture. Edema within the right lateral chest wall soft tissues. Small gas and fluid collection within the posterolateral, inferior chest wall soft tissues tracking towards the skin presumably due to prior chest tube placement. Some fluid and gas but no sizable organized abscess. CT ABDOMEN PELVIS FINDINGS Hepatobiliary: Distended gallbladder. No calcified stone, biliary dilatation or focal hepatic abnormality. Pancreas: Unremarkable. No pancreatic ductal dilatation or surrounding inflammatory changes. Spleen: Normal in size without focal abnormality. Adrenals/Urinary Tract: Adrenal glands are within normal limits. Kidneys show no hydronephrosis. 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 There is  no dilated small bowel or acute bowel wall thickening. Vascular/Lymphatic: Advanced aortic atherosclerosis. No aneurysm. No suspicious lymph nodes. Reproductive: Hysterectomy.  No adnexal mass Other: Negative for pelvic effusion or free air. Musculoskeletal: No acute or suspicious osseous abnormality. Hardware in the left femur. IMPRESSION: 1. Status post right upper lobectomy. Moderate right hydropneumothorax, described in and outside chest x-ray report but images not available for direct comparison. 2. Widespread bilateral foci of irregular ground-glass disease, suspect for multifocal pneumonia. 3. Acute mildly displaced right fifth lateral rib fracture. 4. Small gas and fluid collection within the posterolateral, inferior right chest wall soft tissues tracking towards the skin presumably due to prior chest tube placement. Some fluid and gas but no sizable organized abscess. 5. No CT evidence for acute intra-abdominal or pelvic abnormality. 6. Aortic atherosclerosis. Aortic Atherosclerosis (ICD10-I70.0). Electronically Signed   By: Jasmine Pang M.D.   On: 07/05/2023 22:05     Data Reviewed: Relevant notes from primary care and specialist visits, past discharge summaries as available in EHR, including Care Everywhere. Prior diagnostic testing as pertinent to current admission diagnoses Updated medications and problem lists for reconciliation ED course, including vitals, labs, imaging, treatment and response to treatment Triage notes, nursing and pharmacy notes and ED provider's notes Notable results as noted in HPI   Assessment and Plan: * HCAP (healthcare-associated pneumonia) SIRS, possible sepsis Right moderate hydropneumothorax s/p RUL lobectomy 06/20/23 Patient with cough, fever, tachycardia, leukocytosis, normal lactic acid Cefepime and vancomycin Antitussives, DuoNebs as needed Incentive spirometer Supplemental oxygen if needed IR consulted for thoracentesis Will get serial chest  x-ray  Wound dehiscence, chest tube incision Possible wound infection at chest tube incision S/p right upper lobe lobectomy 06/20/2023 with postprocedural hydropneumothorax Patient reports squirting of large amount of fluid from chest tube wound following a bout of coughing.  Stitches were removed earlier in the day Likely related to moderate hydropneumothorax seen on CT CT surgery was consulted from the ED, Dr. Cliffton Asters at [facility] need for urgent chest tube Wound culture and local wound care Continue above antibiotics of cefepime and vancomycin  Thrombocytosis S/p plasma pheresis at [facility] 06/20/23 Continue to monitor. Consider hematology consult  S/P right upper lobectomy 06/20/2023 (pathology benign)    Paroxysmal SVT (supraventricular tachycardia) (HCC) No acute issues  Essential hypertension Continue lisinopril  Anxiety and depression Continue venlafaxine and buspirone  Mild intermittent asthma without complication Continue home inhalers with DuoNebs as needed  History of upper GI bleed 02/2023 from anastomotic ulcer No acute issues Hemoglobin at baseline  History of recurrent syncope No acute issues at this  time  Tobacco use disorder Nicotine patch  Back pain, chronic Patient states she is no longer on Suboxone Oxycodone as needed    DVT prophylaxis: SCD for procedure  Consults: IR  Advance Care Planning:   Code Status: Prior   Family Communication: none  Disposition Plan: Back to previous home environment  Severity of Illness: The appropriate patient status for this patient is INPATIENT. Inpatient status is judged to be reasonable and necessary in order to provide the required intensity of service to ensure the patient's safety. The patient's presenting symptoms, physical exam findings, and initial radiographic and laboratory data in the context of their chronic comorbidities is felt to place them at high risk for further clinical deterioration.  Furthermore, it is not anticipated that the patient will be medically stable for discharge from the hospital within 2 midnights of admission.   * I certify that at the point of admission it is my clinical judgment that the patient will require inpatient hospital care spanning beyond 2 midnights from the point of admission due to high intensity of service, high risk for further deterioration and high frequency of surveillance required.*  Author: Andris Baumann, MD 07/05/2023 11:33 PM  For on call review [email or web address].

## 2023-07-05 NOTE — Sepsis Progress Note (Addendum)
Elink monitoring for the code sepsis protocol. Notified bedside nurse of need to draw and administer antibiotics, lactic acid, and blood cultures.

## 2023-07-05 NOTE — ED Triage Notes (Signed)
Pt brought in [facility] from home. Pt states they removed part of her right lung on 06/20/23. A stitch was removed from the drain on that incision today. She states it is squirting ever since. Also reports abdominal pain.  EMS vitals: BP 156/64 94 HR 98% SPO2 on RA

## 2023-07-05 NOTE — Assessment & Plan Note (Addendum)
S/p plasma pheresis at [facility] 06/20/23 Continue to monitor. Consider hematology consult

## 2023-07-06 ENCOUNTER — Inpatient Hospital Stay: Payer: 59

## 2023-07-06 DIAGNOSIS — T8131XA Disruption of external operation (surgical) wound, not elsewhere classified, initial encounter: Secondary | ICD-10-CM

## 2023-07-06 DIAGNOSIS — J189 Pneumonia, unspecified organism: Secondary | ICD-10-CM | POA: Diagnosis not present

## 2023-07-06 LAB — CBC
HCT: 31.4 % — ABNORMAL LOW (ref 36.0–46.0)
Hemoglobin: 10 g/dL — ABNORMAL LOW (ref 12.0–15.0)
MCH: 30.4 pg (ref 26.0–34.0)
MCHC: 31.8 g/dL (ref 30.0–36.0)
MCV: 95.4 fL (ref 80.0–100.0)
Platelets: 766 10*3/uL — ABNORMAL HIGH (ref 150–400)
RBC: 3.29 MIL/uL — ABNORMAL LOW (ref 3.87–5.11)
RDW: 13.7 % (ref 11.5–15.5)
WBC: 10 10*3/uL (ref 4.0–10.5)
nRBC: 0 % (ref 0.0–0.2)

## 2023-07-06 LAB — MRSA NEXT GEN BY PCR, NASAL: MRSA by PCR Next Gen: NOT DETECTED

## 2023-07-06 LAB — BASIC METABOLIC PANEL
Anion gap: 8 (ref 5–15)
BUN: 10 mg/dL (ref 8–23)
CO2: 25 mmol/L (ref 22–32)
Calcium: 8.2 mg/dL — ABNORMAL LOW (ref 8.9–10.3)
Chloride: 101 mmol/L (ref 98–111)
Creatinine, Ser: 0.45 mg/dL (ref 0.44–1.00)
GFR, Estimated: >60 mL/min (ref >60)
Glucose, Bld: 94 mg/dL (ref 70–99)
Potassium: 3.2 mmol/L — ABNORMAL LOW (ref 3.5–5.1)
Sodium: 134 mmol/L — ABNORMAL LOW (ref 135–145)

## 2023-07-06 LAB — URINALYSIS, W/ REFLEX TO CULTURE (INFECTION SUSPECTED)
Bilirubin Urine: NEGATIVE
Glucose, UA: NEGATIVE mg/dL
Hgb urine dipstick: NEGATIVE
Ketones, ur: NEGATIVE mg/dL
Leukocytes,Ua: NEGATIVE
Nitrite: NEGATIVE
Protein, ur: NEGATIVE mg/dL
RBC / HPF: 0 RBC/hpf (ref 0–5)
Specific Gravity, Urine: 1.011 (ref 1.005–1.030)
Squamous Epithelial / HPF: 0 /[HPF] (ref 0–5)
pH: 7 (ref 5.0–8.0)

## 2023-07-06 LAB — LACTIC ACID, PLASMA: Lactic Acid, Venous: 0.8 mmol/L (ref 0.5–1.9)

## 2023-07-06 MED ORDER — VANCOMYCIN HCL 750 MG/150ML IV SOLN
750.0000 mg | INTRAVENOUS | Status: DC
  Administered 2023-07-06: 750 mg via INTRAVENOUS
  Filled 2023-07-06 (×2): qty 150

## 2023-07-06 MED ORDER — SODIUM CHLORIDE 0.9 % IV SOLN
2.0000 g | Freq: Two times a day (BID) | INTRAVENOUS | Status: DC
  Administered 2023-07-06 – 2023-07-07 (×3): 2 g via INTRAVENOUS
  Filled 2023-07-06 (×3): qty 12.5

## 2023-07-06 MED ORDER — OXYCODONE HCL 5 MG PO TABS
5.0000 mg | ORAL_TABLET | ORAL | Status: DC | PRN
  Administered 2023-07-06 – 2023-07-07 (×8): 5 mg via ORAL
  Filled 2023-07-06 (×8): qty 1

## 2023-07-06 MED ORDER — POTASSIUM CHLORIDE CRYS ER 20 MEQ PO TBCR
40.0000 meq | EXTENDED_RELEASE_TABLET | Freq: Once | ORAL | Status: AC
  Administered 2023-07-06: 40 meq via ORAL
  Filled 2023-07-06 (×2): qty 2

## 2023-07-06 MED ORDER — VANCOMYCIN HCL IN DEXTROSE 1-5 GM/200ML-% IV SOLN: 1000.0000 mg | INTRAVENOUS | Status: DC

## 2023-07-06 NOTE — Progress Notes (Signed)
Pharmacy Antibiotic Note  Tara Craig is a 62 y.o. female admitted on 07/05/2023 with HCAP.  Pharmacy has been consulted for Cefepime & Vancomycin dosing for 7 days.  Plan: Continue cefepime 2 gm Q12H per indication & renal function  Adjust vancomycin dose to 750 mg Q24H  Goal AUC 400 - 550 Estimated AUC 423.4 Estimated Cmin 9.8  Scr 0.8, IBW, Vd 0.72  Pharmacy will continue to monitor and dose adjust appropriately   Temp (24hrs), Avg:99.2 F (37.3 C), Min:98.7 F (37.1 C), Max:100.6 F (38.1 C)  Recent Labs  Lab 07/05/23 1944 07/06/23 0630  WBC 15.1* 10.0  CREATININE 0.53 0.45  LATICACIDVEN 0.9 0.8    Estimated Creatinine Clearance: 53 mL/min (by C-G formula based on SCr of 0.45 mg/dL).    Allergies  Allergen Reactions   Pumpkin Seed Oil Anaphylaxis    Other Reaction(s): Unknown   Butrans [Buprenorphine] Other (See Comments)    Patient states she developed a severe rash to the adhesive on the patch. She tolerates Buprenorphine    Baclofen Other (See Comments)    Significant somnolence   Cyclobenzaprine Other (See Comments)    Significant somnolence (pass out)   Pregabalin     Other Reaction(s): Other (See Comments)  Significant somnolence (pass out)   Pumpkin Flavor    Amlodipine Other (See Comments)    Lower extremity swelling, even at 5mg  dose   Atenolol Other (See Comments)    fatigue   Tramadol Nausea And Vomiting, Other (See Comments) and Tinitus    Other Reaction(s): Hallucinations   Antimicrobials this admission: 11/26 Cefepime >> x 7 days 11/26 Vancomycin >> x 7 days 11/26 Flagyl >> x 1 dose  Microbiology results: 11/26 BCx: NGTD  Thank you for allowing pharmacy to be a part of this patient's care.  Littie Deeds, PharmD Pharmacy Resident  07/06/2023 2:19 PM

## 2023-07-06 NOTE — Progress Notes (Signed)
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 Encourage incentive spirometry.  Sepsis: met criteria w/ fever, tachycardia, leukocytosis & pneumonia. Continue on IV cefepime, vanco. Encourage incentive spirometry.   Wound dehiscence: possible wound infection at chest tube incision. S/p right upper lobe lobectomy 06/20/2023 with postprocedural hydropneumothorax. Patient reports squirting of large amount of fluid from chest tube wound following a bout of coughing. Stitches were removed earlier in the day. Likely related to moderate hydropneumothorax seen on CT. CT surgery was consulted from the ED, Dr. Cliffton Asters at [facility] need for urgent chest tube. Continue w/ wound care. Blood cxs NGTD. Continue on IV cefepime, vanco. IR consulted for thoracentesis   Thrombocytosis: s/p plasma pheresis at [facility] 06/20/23. Continue to monitor.   S/P right upper lobectomy: 06/20/2023 for pathology benign at [facility].   Acute mildly displaced right fifth lateral rib fracture: continue w/ supportive care   Paroxysmal SVT: continue on tele. No acute issues currently   Hypokalemia: potassium given   HTN: continue on lisinopril   Depression: severity unknown. Continue on  venlafaxine, buspirone prn    Mild intermittent asthma: bronchodilators prn    History of upper GI bleed: 02/2023 from anastomotic ulcer. No acute issues currently    Tobacco use disorder: smoking cessation counseling x 5 mins.  Nicotine patch   Chronic back pain: oxycodone prn        DVT prophylaxis: SCDs Code Status: full  Family Communication:  Disposition Plan: PT/OT consulted   Level of care: Telemetry Medical Consultants:    Procedures:   Antimicrobials: cefepime, vanco   Subjective: Pt c/o fatigue   Objective: Vitals:   07/06/23 0305 07/06/23 0325 07/06/23 0755 07/06/23 0758  BP:  (!) 176/88 (!) 154/93   Pulse:  88 94   Resp:  17 16   Temp: 98.9 F (37.2 C)  98.8 F (37.1 C)   TempSrc: Oral Oral Oral   SpO2:  98% 100% 98%  Weight:      Height:        Intake/Output Summary (Last 24 hours) at 07/06/2023 1001 Last data filed at 07/06/2023 0055 Gross per 24 hour  Intake 400 ml  Output --  Net 400 ml   Filed Weights   07/05/23 1841  Weight: 50.8 kg    Examination:  General exam: Appears calm and comfortable  Respiratory system: decreased breath sounds b/l  Cardiovascular system: S1 & S2+. No rubs, gallops or clicks. Gastrointestinal system: Abdomen is nondistended, soft and nontender. Normal bowel sounds heard. Central nervous system: Alert and oriented. Moves all extremities  Psychiatry: Judgement and insight appear normal. Mood & affect appropriate.     Data Reviewed: I have personally reviewed following labs and imaging studies  CBC: Recent Labs  Lab 07/05/23 1944 07/06/23 0630  WBC 15.1* 10.0  NEUTROABS 12.1*  --   HGB 9.9* 10.0*  HCT 31.0* 31.4*  MCV 95.4 95.4  PLT 759* 766*   Basic Metabolic Panel: Recent Labs  Lab 07/05/23 1944 07/06/23 0630  NA 136 134*  K 3.2* 3.2*  CL 101 101  CO2 26 25  GLUCOSE 84 94  BUN 18 10  CREATININE 0.53 0.45  CALCIUM 8.1* 8.2*   GFR: Estimated Creatinine Clearance: 53 mL/min (by C-G  formula based on SCr of 0.45 mg/dL). Liver Function Tests: Recent Labs  Lab 07/05/23 1944  AST 14*  ALT 11  ALKPHOS 93  BILITOT 0.4  PROT 6.2*  ALBUMIN 2.7*   Recent Labs  Lab 07/05/23 1944  LIPASE 25   No results for input(s): "AMMONIA" in the last 168 hours. Coagulation Profile: Recent Labs  Lab 07/05/23 1944  INR 1.0   Cardiac Enzymes: No results for input(s): "CKTOTAL", "CKMB", "CKMBINDEX", "TROPONINI" in the last 168 hours. BNP (last 3 results) No results for input(s): "PROBNP" in the last 8760 hours. HbA1C: No results for input(s): "HGBA1C" in the last 72 hours. CBG: No results for input(s): "GLUCAP" in the last 168 hours. Lipid Profile: No results for input(s): "CHOL", "HDL", "LDLCALC", "TRIG", "CHOLHDL", "LDLDIRECT" in the last 72 hours. Thyroid Function Tests: No results for input(s): "TSH", "T4TOTAL", "FREET4", "T3FREE", "THYROIDAB" in the last 72 hours. Anemia Panel: No results for input(s): "VITAMINB12", "FOLATE", "FERRITIN", "TIBC", "IRON", "RETICCTPCT" in the last 72 hours. Sepsis Labs: Recent Labs  Lab 07/05/23 1944 07/06/23 0630  LATICACIDVEN 0.9 0.8    Recent Results (from the past 240 hour(s))  Resp panel by RT-PCR (RSV, Flu A&B, Covid) Anterior Nasal Swab     Status: None   Collection Time: 07/05/23  7:44 PM   Specimen: Anterior Nasal Swab  Result Value Ref Range Status   SARS Coronavirus 2 by RT PCR NEGATIVE NEGATIVE Final    Comment: (NOTE) SARS-CoV-2 target nucleic acids are NOT DETECTED.  The SARS-CoV-2 RNA is generally detectable in upper respiratory specimens during the acute phase of infection. The lowest concentration of SARS-CoV-2 viral copies this assay can detect is 138 copies/mL. A negative result does not preclude SARS-Cov-2 infection and should not be used as the sole basis for treatment or other patient management decisions. A negative result may occur with  improper specimen collection/handling, submission of specimen  other than nasopharyngeal swab, presence of viral mutation(s) within the areas targeted by this assay, and inadequate number of viral copies(<138 copies/mL). A negative result must be combined with clinical observations, patient history, and epidemiological information. The expected result is Negative.  Fact Sheet for Patients:  BloggerCourse.com  Fact Sheet for Healthcare Providers:  SeriousBroker.it  This test is no t yet approved or cleared by the [place] FDA and  has been authorized for detection and/or diagnosis of SARS-CoV-2 by FDA under an Emergency Use Authorization (EUA). This EUA will remain  in effect (meaning this test can be used) for the duration of the COVID-19 declaration under Section 564(b)(1) of the Act, 21 U.S.C.section 360bbb-3(b)(1), unless the authorization is terminated  or revoked sooner.       Influenza A by PCR NEGATIVE NEGATIVE Final   Influenza B by PCR NEGATIVE NEGATIVE Final    Comment: (NOTE) The Xpert Xpress SARS-CoV-2/FLU/RSV plus assay is intended as an aid in the diagnosis of influenza from Nasopharyngeal swab specimens and should not be used as a sole basis for treatment. Nasal washings and aspirates are unacceptable for  Xpert Xpress SARS-CoV-2/FLU/RSV testing.  Fact Sheet for Patients: BloggerCourse.com  Fact Sheet for Healthcare Providers: SeriousBroker.it  This test is not yet approved or cleared by the [place] FDA and has been authorized for detection and/or diagnosis of SARS-CoV-2 by FDA under an Emergency Use Authorization (EUA). This EUA will remain in effect (meaning this test can be used) for the duration of the COVID-19 declaration under Section 564(b)(1) of the Act, 21 U.S.C. section 360bbb-3(b)(1), unless the authorization is terminated or revoked.     Resp Syncytial Virus by PCR NEGATIVE NEGATIVE Final     Comment: (NOTE) Fact Sheet for Patients: BloggerCourse.com  Fact Sheet for Healthcare Providers: SeriousBroker.it  This test is not yet approved or cleared by the [place] FDA and has been authorized for detection and/or diagnosis of SARS-CoV-2 by FDA under an Emergency Use Authorization (EUA). This EUA will remain in effect (meaning this test can be used) for the duration of the COVID-19 declaration under Section 564(b)(1) of the Act, 21 U.S.C. section 360bbb-3(b)(1), unless the authorization is terminated or revoked.  Performed at [facility], [address] [place], [place] [address]   Blood Culture (routine x 2)     Status: None (Preliminary result)   Collection Time: 07/05/23  8:21 PM   Specimen: BLOOD  Result Value Ref Range Status   Specimen Description BLOOD BLOOD RIGHT HAND  Final   Special Requests   Final    BOTTLES DRAWN AEROBIC AND ANAEROBIC Blood Culture adequate volume   Culture   Final    NO GROWTH < 12 HOURS Performed at [facility], [address] [place], [place] [address]    Report Status PENDING  Incomplete  Blood Culture (routine x 2)     Status: None (Preliminary result)   Collection Time: 07/06/23  1:00 AM   Specimen: BLOOD  Result Value Ref Range Status   Specimen Description BLOOD BLOOD LEFT ARM  Final   Special Requests   Final    BOTTLES DRAWN AEROBIC AND ANAEROBIC Blood Culture adequate volume   Culture   Final    NO GROWTH < 12 HOURS Performed at [facility], [address], [place], [place] [address]    Report Status PENDING  Incomplete  MRSA Next Gen by PCR, Nasal     Status: None   Collection Time: 07/06/23  7:58 AM   Specimen: Nasal Mucosa; Nasal Swab  Result Value Ref Range Status   MRSA by PCR Next Gen NOT DETECTED NOT DETECTED Final    Comment: (NOTE) The GeneXpert MRSA Assay (FDA approved for NASAL specimens only), is one component of a  comprehensive MRSA colonization surveillance program. It is not intended to diagnose MRSA infection nor to guide or monitor treatment for MRSA infections. Test performance is not FDA approved in patients less than 83 years old. Performed at [facility], [address] [place], [place] [address]          Radiology Studies: DG Chest [place] 1 View  Result Date: 07/06/2023 CLINICAL DATA:  Pneumothorax EXAM: PORTABLE CHEST 1 VIEW COMPARISON:  02/24/2023, CT 07/05/2023 . FINDINGS: Surgical changes of probable right upper lobectomy are identified with right hilar clips, right upper lung zone anastomotic staple line, and right sixth rib thoracotomy defect. small to moderate right apical pneumothorax appears stable since prior CT examination. Right basilar infiltrate and small right pleural effusion are again noted. Left lung is clear. No pneumothorax or pleural effusion on the left. Cardiac size  within normal limits. Right total shoulder arthroplasty has been performed IMPRESSION: 1. Stable small to moderate right apical pneumothorax. 2. Stable right basilar infiltrate and small right pleural effusion. 3. Surgical changes of probable right upper lobectomy. Electronically Signed   By: Helyn Numbers M.D.   On: 07/06/2023 04:05   CT CHEST ABDOMEN PELVIS W CONTRAST  Result Date: 07/05/2023 CLINICAL DATA:  Right-sided chest pain recent chest tube removal stitches opened up pus drainage recent lobectomy EXAM: CT CHEST, ABDOMEN, AND PELVIS WITH CONTRAST TECHNIQUE: Multidetector CT imaging of the chest, abdomen and pelvis was performed following the standard protocol during bolus administration of intravenous contrast. RADIATION DOSE REDUCTION: This exam was performed according to the departmental dose-optimization program which includes automated exposure control, adjustment of the mA and/or kV according to patient size and/or use of iterative reconstruction technique. CONTRAST:  OMNIPAQUE  IOHEXOL 300 MG/ML  SOLN COMPARISON:  Outside chest x-ray imaging report 06/28/2023 FINDINGS: CT CHEST FINDINGS Cardiovascular: Mild aortic atherosclerosis. No aneurysm. Normal cardiac size. Coronary vascular calcification. No pericardial effusion. Mediastinum/Nodes: Midline trachea. No suspicious thyroid mass. Esophagus within normal limits. Lungs/Pleura: Status post right upper lobectomy. Moderate right hydropneumothorax. Sutures and bandlike soft tissue density in the superior right middle lobe presumably postsurgical change. Widespread bilateral foci of irregular ground-glass disease. Musculoskeletal: Sternum appears intact. Right 6 partial posterior rib resection. Acute mildly displaced right fifth lateral rib fracture. Edema within the right lateral chest wall soft tissues. Small gas and fluid collection within the posterolateral, inferior chest wall soft tissues tracking towards the skin presumably due to prior chest tube placement. Some fluid and gas but no sizable organized abscess. CT ABDOMEN PELVIS FINDINGS Hepatobiliary: Distended gallbladder. No calcified stone, biliary dilatation or focal hepatic abnormality. Pancreas: Unremarkable. No pancreatic ductal dilatation or surrounding inflammatory changes. Spleen: Normal in size without focal abnormality. Adrenals/Urinary Tract: Adrenal glands are within normal limits. Kidneys show no hydronephrosis. 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 There is no dilated small bowel or acute bowel wall thickening. Vascular/Lymphatic: Advanced aortic atherosclerosis. No aneurysm. No suspicious lymph nodes. Reproductive: Hysterectomy.  No adnexal mass Other: Negative for pelvic effusion or free air. Musculoskeletal: No acute or suspicious osseous abnormality. Hardware in the left femur. IMPRESSION: 1. Status post right upper lobectomy. Moderate right hydropneumothorax, described in and outside chest x-ray report but  images not available for direct comparison. 2. Widespread bilateral foci of irregular ground-glass disease, suspect for multifocal pneumonia. 3. Acute mildly displaced right fifth lateral rib fracture. 4. Small gas and fluid collection within the posterolateral, inferior right chest wall soft tissues tracking towards the skin presumably due to prior chest tube placement. Some fluid and gas but no sizable organized abscess. 5. No CT evidence for acute intra-abdominal or pelvic abnormality. 6. Aortic atherosclerosis. Aortic Atherosclerosis (ICD10-I70.0). Electronically Signed   By: Jasmine Pang M.D.   On: 07/05/2023 22:05        Scheduled Meds:  busPIRone  15 mg Oral TID   lisinopril  40 mg Oral Daily   pantoprazole  40 mg Oral Daily   sodium chloride flush  10 mL Intravenous Q12H   venlafaxine XR  150 mg Oral Q breakfast   Continuous Infusions:  ceFEPime (MAXIPIME) IV 2 g (07/06/23 0952)   lactated ringers 150 mL/hr (07/06/23 0322)   [START ON 07/07/2023] vancomycin       LOS: 1 day       Charise Killian, MD [facility] Pager [phone]  [phone]  If 7PM-7AM, please contact night-coverage [email or web address] 07/06/2023, 10:01 AM

## 2023-07-06 NOTE — Progress Notes (Signed)
Pharmacy Antibiotic Note  Tara Craig is a 62 y.o. female admitted on 07/05/2023 with HCAP.  Pharmacy has been consulted for Cefepime & Vancomycin dosing for 7 days.  Plan: Cefepime 2 gm q12hr per indication & renal fxn.  Pt given Vancomycin 1000 mg once. Vancomycin 1000 mg IV Q 24 hrs. Goal AUC 400-550. Expected AUC: 499.7 SCr used: 0.7 (11/26 SCr = 0.53)  Pharmacy will continue to follow and will adjust abx dosing whenever warranted.  Temp (24hrs), Avg:99.7 F (37.6 C), Min:98.8 F (37.1 C), Max:100.6 F (38.1 C)   Recent Labs  Lab 07/05/23 1944  WBC 15.1*  CREATININE 0.53  LATICACIDVEN 0.9    Estimated Creatinine Clearance: 53 mL/min (by C-G formula based on SCr of 0.53 mg/dL).    Allergies  Allergen Reactions   Butrans [Buprenorphine] Other (See Comments)    Patient states she developed a severe rash to the adhesive on the patch. She tolerates Buprenorphine    Pumpkin Flavor    Tramadol Nausea And Vomiting    Antimicrobials this admission: 11/26 Cefepime >> x 7 days 11/26 Vancomycin >> x 7 days 11/26 Flagyl >> x 1 dose  Microbiology results: 11/26 BCx: Pending  Thank you for allowing pharmacy to be a part of this patient's care.  Otelia Sergeant, PharmD, [facility] 07/06/2023 12:13 AM

## 2023-07-06 NOTE — Assessment & Plan Note (Addendum)
Possible wound infection at chest tube incision S/p right upper lobe lobectomy 06/20/2023 with postprocedural hydropneumothorax Patient reports squirting of large amount of fluid from chest tube wound following a bout of coughing.  Stitches were removed earlier in the day Likely related to moderate hydropneumothorax seen on CT CT surgery was consulted from the ED, Dr. Cliffton Asters at [facility] need for urgent chest tube Wound culture and local wound care Continue above antibiotics of cefepime and vancomycin

## 2023-07-06 NOTE — Assessment & Plan Note (Signed)
Patient states she is no longer on Suboxone Oxycodone as needed

## 2023-07-07 DIAGNOSIS — J189 Pneumonia, unspecified organism: Secondary | ICD-10-CM | POA: Diagnosis not present

## 2023-07-07 LAB — CBC
HCT: 32.5 % — ABNORMAL LOW (ref 36.0–46.0)
Hemoglobin: 10.2 g/dL — ABNORMAL LOW (ref 12.0–15.0)
MCH: 29.3 pg (ref 26.0–34.0)
MCHC: 31.4 g/dL (ref 30.0–36.0)
MCV: 93.4 fL (ref 80.0–100.0)
Platelets: 816 10*3/uL — ABNORMAL HIGH (ref 150–400)
RBC: 3.48 MIL/uL — ABNORMAL LOW (ref 3.87–5.11)
RDW: 13.8 % (ref 11.5–15.5)
WBC: 9 10*3/uL (ref 4.0–10.5)
nRBC: 0 % (ref 0.0–0.2)

## 2023-07-07 LAB — BASIC METABOLIC PANEL
Anion gap: 10 (ref 5–15)
BUN: 13 mg/dL (ref 8–23)
CO2: 27 mmol/L (ref 22–32)
Calcium: 8.5 mg/dL — ABNORMAL LOW (ref 8.9–10.3)
Chloride: 103 mmol/L (ref 98–111)
Creatinine, Ser: 0.68 mg/dL (ref 0.44–1.00)
GFR, Estimated: >60 mL/min (ref >60)
Glucose, Bld: 88 mg/dL (ref 70–99)
Potassium: 2.9 mmol/L — ABNORMAL LOW (ref 3.5–5.1)
Sodium: 140 mmol/L (ref 135–145)

## 2023-07-07 LAB — HIV ANTIBODY (ROUTINE TESTING W REFLEX): HIV Screen 4th Generation wRfx: NONREACTIVE

## 2023-07-07 MED ORDER — POTASSIUM CHLORIDE CRYS ER 20 MEQ PO TBCR
40.0000 meq | EXTENDED_RELEASE_TABLET | Freq: Two times a day (BID) | ORAL | Status: DC
  Administered 2023-07-07: 40 meq via ORAL
  Filled 2023-07-07: qty 2

## 2023-07-07 MED ORDER — LEVOFLOXACIN 750 MG PO TABS: 750.0000 mg | ORAL_TABLET | Freq: Every day | ORAL | 0 refills | Status: AC

## 2023-07-07 NOTE — Progress Notes (Signed)
Patient is leaving AMA. MD Fabienne Bruns notified and aware. AMA form signed and placed in chart.

## 2023-07-07 NOTE — Discharge Summary (Signed)
Physician Discharge Summary  Tara Craig YQM:[id number] DOB: April 30, 1961 DOA: 07/05/2023  PCP: [name], MD  Admit date: 07/05/2023 Discharge date: 07/07/2023  Admitted From: home  Disposition: Pt left AMA  Recommendations for Outpatient Follow-up:  Pt left AMA  Home Health: no  Equipment/Devices:  Discharge Condition: guarded  CODE STATUS: full  Diet recommendation: Heart Healthy   Brief/Interim Summary: HPI was taken from Dr. Para March: ENVI BIBEY is a 62 y.o. 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  Patient states after she returned home from having the stitches removed, she had a coughing spell and then felt liquid squirting from the wound on her chest.   Patient states the fluid soaked her clothing spilling onto the floor to she had to mop it up: She has associated right-sided chest pain.  She denies shortness of breath.  She endorses having an ongoing cough and fevers at home.  Chest x-ray done during her oncology visit did show a hydrothorax and she was scheduled for nonemergent thoracentesis.   ED course and data review: Temperature 100.6 with pulse 91, BP 164/98 and O2 sat 99% on room air. Labs notable for WBC 15,000 with lactic acid 0.9 Respiratory viral panel negative for COVID, flu and RSV. Hemoglobin at baseline at 9.9.  Thrombocytosis of 759(had platelet pheresis at [facility] 11/11) Potassium 3.2 Lipase and LFTs unremarkable EKG,  personally viewed and interpreted showing sinus at 89 with no acute ST-T wave changes CT chest abdomen and pelvis showing moderate right hydropneumothorax as well as findings concerning for multifocal pneumonia and an acute right fifth lateral rib fracture as detailed below IMPRESSION: 1. Status post right upper lobectomy. Moderate right hydropneumothorax, described in and outside chest x-ray report but images not available for direct comparison. 2. Widespread bilateral foci of irregular ground-glass disease, suspect for multifocal pneumonia. 3. Acute mildly displaced right fifth lateral rib fracture. 4. Small gas and fluid collection within the posterolateral, inferior right chest wall soft tissues tracking towards the skin presumably due to prior chest tube placement. Some fluid and gas but no sizable organized abscess. 5. No CT evidence for acute intra-abdominal or pelvic abnormality. 6. Aortic atherosclerosis.   The patient was started on cefepime vancomycin and metronidazole and given morphine for pain Hospitalist consulted for admission and I requested cardiothoracic opinion given hydropneumothorax. EDP, Dr. Anner Crete subsequently spoke with CT surgery at [facility], Dr. Cliffton Asters who advised no need for urgent chest tube as patient is not in respiratory distress and not hypoxic and hemodynamically stable.     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 Encourage incentive spirometry. Pt decided to leave AMA on 07/07/23   Sepsis: met criteria w/ fever, tachycardia, leukocytosis & pneumonia. Continue on IV cefepime, vanco. Encourage incentive spirometry.   Wound dehiscence: possible wound infection at chest tube incision. S/p right upper lobe lobectomy 06/20/2023 with postprocedural hydropneumothorax. Patient reports squirting of large amount of fluid from chest tube wound following a bout of coughing. Stitches were removed earlier in the day. Likely related to moderate hydropneumothorax seen on CT. CT surgery was consulted from the ED, Dr. Cliffton Asters at [facility] need for urgent chest tube. Continue w/ wound care. Blood cxs NGTD. Continue on IV cefepime, vanco. IR consulted for thoracentesis but no safe window for percutaneous access. Pt decided to leave AMA 07/07/23   Thrombocytosis: s/p plasma pheresis at [facility] 06/20/23. Continue to monitor.   S/P right upper lobectomy: 06/20/2023 for pathology benign at [facility].   Acute mildly displaced right fifth lateral rib fracture: continue w/ supportive care   Paroxysmal SVT: continue on tele. No acute issues currently   Hypokalemia: KCL ordered  HTN: continue on lisinopril   Depression: severity unknown. Continue on venlafaxine, buspirone    Mild intermittent asthma: bronchodilators prn    History of upper GI bleed: 02/2023 from anastomotic ulcer. No acute issues currently    Tobacco use disorder: smoking cessation counseling x 5 mins.  Nicotine patch   Chronic back pain: oxycodone prn   Discharge Instructions     Allergies  Allergen Reactions   Pumpkin Seed Oil Anaphylaxis    Other Reaction(s): Unknown   Butrans [Buprenorphine] Other (See Comments)    Patient states she developed a severe rash to the adhesive on the patch. She tolerates Buprenorphine    Baclofen Other (See Comments)    Significant  somnolence   Cyclobenzaprine Other (See Comments)    Significant somnolence (pass out)   Pregabalin     Other Reaction(s): Other (See Comments)  Significant somnolence (pass out)   Pumpkin Flavor    Amlodipine Other (See Comments)    Lower extremity swelling, even at 5mg  dose   Atenolol Other (See Comments)    fatigue   Tramadol Nausea And Vomiting, Other (See Comments) and Tinitus    Other Reaction(s): Hallucinations    Consultations:    Procedures/Studies: [place] CHEST (PLEURAL EFFUSION)  Result Date: 07/06/2023 INDICATION: 62 year old s/p recent right upper lobectomy, now with drainage from surgical site and small pleural effusion. Thoracentesis requested. EXAM: CHEST ULTRASOUND COMPARISON:  None Available. FINDINGS: Small right-sided pleural effusion. No significant pocket of fluid or percutaneous window to allow safe thoracentesis. Risks outweigh the benefits. IMPRESSION: Small right-sided pleural effusion with no safe window for percutaneous access. Performed by: Loyce Dys PA-C Electronically Signed   By: Judie Petit.  Shick M.D.   On: 07/06/2023 11:31   DG Chest Port 1 View  Result Date: 07/06/2023 CLINICAL DATA:  Pneumothorax EXAM: PORTABLE CHEST 1 VIEW COMPARISON:  02/24/2023, CT 07/05/2023 . FINDINGS: Surgical changes of probable right upper lobectomy are identified with right hilar clips, right upper lung zone anastomotic staple line, and right sixth rib thoracotomy defect. small to moderate right apical pneumothorax appears stable since prior CT examination. Right basilar infiltrate and small right pleural effusion are again noted. Left lung is clear. No pneumothorax or pleural effusion on the left. Cardiac size within normal limits. Right total shoulder arthroplasty has been performed IMPRESSION: 1. Stable small to  moderate right apical pneumothorax. 2. Stable right basilar infiltrate and small right pleural effusion. 3. Surgical changes of probable right upper lobectomy.  Electronically Signed   By: Helyn Numbers M.D.   On: 07/06/2023 04:05   CT CHEST ABDOMEN PELVIS W CONTRAST  Result Date: 07/05/2023 CLINICAL DATA:  Right-sided chest pain recent chest tube removal stitches opened up pus drainage recent lobectomy EXAM: CT CHEST, ABDOMEN, AND PELVIS WITH CONTRAST TECHNIQUE: Multidetector CT imaging of the chest, abdomen and pelvis was performed following the standard protocol during bolus administration of intravenous contrast. RADIATION DOSE REDUCTION: This exam was performed according to the departmental dose-optimization program which includes automated exposure control, adjustment of the mA and/or kV according to patient size and/or use of iterative reconstruction technique. CONTRAST:  OMNIPAQUE IOHEXOL 300 MG/ML  SOLN COMPARISON:  Outside chest x-ray imaging report 06/28/2023 FINDINGS: CT CHEST FINDINGS Cardiovascular: Mild aortic atherosclerosis. No aneurysm. Normal cardiac size. Coronary vascular calcification. No pericardial effusion. Mediastinum/Nodes: Midline trachea. No suspicious thyroid mass. Esophagus within normal limits. Lungs/Pleura: Status post right upper lobectomy. Moderate right hydropneumothorax. Sutures and bandlike soft tissue density in the superior right middle lobe presumably postsurgical change. Widespread bilateral foci of irregular ground-glass disease. Musculoskeletal: Sternum appears intact. Right 6 partial posterior rib resection. Acute mildly displaced right fifth lateral rib fracture. Edema within the right lateral chest wall soft tissues. Small gas and fluid collection within the posterolateral, inferior chest wall soft tissues tracking towards the skin presumably due to prior chest tube placement. Some fluid and gas but no sizable organized abscess. CT ABDOMEN PELVIS FINDINGS Hepatobiliary: Distended gallbladder. No calcified stone, biliary dilatation or focal hepatic abnormality. Pancreas: Unremarkable. No pancreatic ductal dilatation  or surrounding inflammatory changes. Spleen: Normal in size without focal abnormality. Adrenals/Urinary Tract: Adrenal glands are within normal limits. Kidneys show no hydronephrosis. 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 There is no dilated small bowel or acute bowel wall thickening. Vascular/Lymphatic: Advanced aortic atherosclerosis. No aneurysm. No suspicious lymph nodes. Reproductive: Hysterectomy.  No adnexal mass Other: Negative for pelvic effusion or free air. Musculoskeletal: No acute or suspicious osseous abnormality. Hardware in the left femur. IMPRESSION: 1. Status post right upper lobectomy. Moderate right hydropneumothorax, described in and outside chest x-ray report but images not available for direct comparison. 2. Widespread bilateral foci of irregular ground-glass disease, suspect for multifocal pneumonia. 3. Acute mildly displaced right fifth lateral rib fracture. 4. Small gas and fluid collection within the posterolateral, inferior right chest wall soft tissues tracking towards the skin presumably due to prior chest tube placement. Some fluid and gas but no sizable organized abscess. 5. No CT evidence for acute intra-abdominal or pelvic abnormality. 6. Aortic atherosclerosis. Aortic Atherosclerosis (ICD10-I70.0). Electronically Signed   By: Jasmine Pang M.D.   On: 07/05/2023 22:05   (Echo, Carotid, EGD, Colonoscopy, ERCP)    Subjective: Pt c/o rib cage pain    Discharge Exam: Vitals:   07/06/23 1608 07/07/23 0824  BP: (!) 166/92 (!) 162/90  Pulse: 88 80  Resp: 18 18  Temp: 97.9 F (36.6 C) 98.2 F (36.8 C)  SpO2: 98% 99%   Vitals:   07/06/23 1545 07/06/23 1607 07/06/23 1608 07/07/23 0824  BP: (!) 157/80  (!) 166/92 (!) 162/90  Pulse: 86  88 80  Resp:   18 18  Temp:  97.9 F (36.6 C) 97.9 F (36.6 C) 98.2 F (36.8 C)  TempSrc:  Oral Oral   SpO2: 99%  98%  99%  Weight:      Height:        General: Pt is alert,  awake, not in acute distress Cardiovascular: S1/S2 +, no rubs, no gallops Respiratory: decreased breath sounds b/l  Abdominal: Soft, NT, ND, bowel sounds + Extremities: no edema, no cyanosis    The results of significant diagnostics from this hospitalization (including imaging, microbiology, ancillary and laboratory) are listed below for reference.     Microbiology: Recent Results (from the past 240 hour(s))  Resp panel by RT-PCR (RSV, Flu A&B, Covid) Anterior Nasal Swab     Status: None   Collection Time: 07/05/23  7:44 PM   Specimen: Anterior Nasal Swab  Result Value Ref Range Status   SARS Coronavirus 2 by RT PCR NEGATIVE NEGATIVE Final    Comment: (NOTE) SARS-CoV-2 target nucleic acids are NOT DETECTED.  The SARS-CoV-2 RNA is generally detectable in upper respiratory specimens during the acute phase of infection. The lowest concentration of SARS-CoV-2 viral copies this assay can detect is 138 copies/mL. A negative result does not preclude SARS-Cov-2 infection and should not be used as the sole basis for treatment or other patient management decisions. A negative result may occur with  improper specimen collection/handling, submission of specimen other than nasopharyngeal swab, presence of viral mutation(s) within the areas targeted by this assay, and inadequate number of viral copies(<138 copies/mL). A negative result must be combined with clinical observations, patient history, and epidemiological information. The expected result is Negative.  Fact Sheet for Patients:  BloggerCourse.com  Fact Sheet for Healthcare Providers:  SeriousBroker.it  This test is no t yet approved or cleared by the [place] FDA and  has been authorized for detection and/or diagnosis of SARS-CoV-2 by FDA under an Emergency Use Authorization (EUA). This EUA will remain  in effect (meaning this test can be used) for the duration of  the COVID-19 declaration under Section 564(b)(1) of the Act, 21 U.S.C.section 360bbb-3(b)(1), unless the authorization is terminated  or revoked sooner.       Influenza A by PCR NEGATIVE NEGATIVE Final   Influenza B by PCR NEGATIVE NEGATIVE Final    Comment: (NOTE) The Xpert Xpress SARS-CoV-2/FLU/RSV plus assay is intended as an aid in the diagnosis of influenza from Nasopharyngeal swab specimens and should not be used as a sole basis for treatment. Nasal washings and aspirates are unacceptable for Xpert Xpress SARS-CoV-2/FLU/RSV testing.  Fact Sheet for Patients: BloggerCourse.com  Fact Sheet for Healthcare Providers: SeriousBroker.it  This test is not yet approved or cleared by the [place] FDA and has been authorized for detection and/or diagnosis of SARS-CoV-2 by FDA under an Emergency Use Authorization (EUA). This EUA will remain in effect (meaning this test can be used) for the duration of the COVID-19 declaration under Section 564(b)(1) of the Act, 21 U.S.C. section 360bbb-3(b)(1), unless the authorization is terminated or revoked.     Resp Syncytial Virus by PCR NEGATIVE NEGATIVE Final    Comment: (NOTE) Fact Sheet for Patients: BloggerCourse.com  Fact Sheet for Healthcare Providers: SeriousBroker.it  This test is not yet approved or cleared by the [place] FDA and has been authorized for detection and/or diagnosis of SARS-CoV-2 by FDA under an Emergency Use Authorization (EUA). This EUA will remain in effect (meaning this test can be used) for the duration of the COVID-19 declaration under Section 564(b)(1) of the Act, 21 U.S.C. section 360bbb-3(b)(1), unless the authorization is terminated or revoked.  Performed at [facility], [address]  [place], [place] [address]   Blood Culture (routine x 2)     Status: None (Preliminary result)    Collection Time: 07/05/23  8:21 PM   Specimen: BLOOD  Result Value Ref Range Status   Specimen Description BLOOD BLOOD RIGHT HAND  Final   Special Requests   Final    BOTTLES DRAWN AEROBIC AND ANAEROBIC Blood Culture adequate volume   Culture   Final    NO GROWTH 2 DAYS Performed at [facility], [address], [place], [place] [address]    Report Status PENDING  Incomplete  Blood Culture (routine x 2)     Status: None (Preliminary result)   Collection Time: 07/06/23  1:00 AM   Specimen: BLOOD  Result Value Ref Range Status   Specimen Description BLOOD BLOOD LEFT ARM  Final   Special Requests   Final    BOTTLES DRAWN AEROBIC AND ANAEROBIC Blood Culture adequate volume   Culture   Final    NO GROWTH 1 DAY Performed at [facility], [address] [place], [place] [address]    Report Status PENDING  Incomplete  MRSA Next Gen by PCR, Nasal     Status: None   Collection Time: 07/06/23  7:58 AM   Specimen: Nasal Mucosa; Nasal Swab  Result Value Ref Range Status   MRSA by PCR Next Gen NOT DETECTED NOT DETECTED Final    Comment: (NOTE) The GeneXpert MRSA Assay (FDA approved for NASAL specimens only), is one component of a comprehensive MRSA colonization surveillance program. It is not intended to diagnose MRSA infection nor to guide or monitor treatment for MRSA infections. Test performance is not FDA approved in patients less than 19 years old. Performed at [facility], [address] [place], [place] [address]      Labs: BNP (last 3 results) No results for input(s): "BNP" in the last 8760 hours. Basic Metabolic Panel: Recent Labs  Lab 07/05/23 1944 07/06/23 0630 07/07/23 0357  NA 136 134* 140  K 3.2* 3.2* 2.9*  CL 101 101 103  CO2 26 25 27   GLUCOSE 84 94 88  BUN 18 10 13   CREATININE 0.53 0.45 0.68  CALCIUM 8.1* 8.2* 8.5*   Liver Function Tests: Recent Labs  Lab 07/05/23 1944  AST 14*  ALT 11  ALKPHOS 93  BILITOT 0.4  PROT  6.2*  ALBUMIN 2.7*   Recent Labs  Lab 07/05/23 1944  LIPASE 25   No results for input(s): "AMMONIA" in the last 168 hours. CBC: Recent Labs  Lab 07/05/23 1944 07/06/23 0630 07/07/23 0357  WBC 15.1* 10.0 9.0  NEUTROABS 12.1*  --   --   HGB 9.9* 10.0* 10.2*  HCT 31.0* 31.4* 32.5*  MCV 95.4 95.4 93.4  PLT 759* 766* 816*   Cardiac Enzymes: No results for input(s): "CKTOTAL", "CKMB", "CKMBINDEX", "TROPONINI" in the last 168 hours. BNP: Invalid input(s): "POCBNP" CBG: No results for input(s): "GLUCAP" in the last 168 hours. D-Dimer No results for input(s): "DDIMER" in the last 72 hours. Hgb A1c No results for input(s): "HGBA1C" in the last 72 hours. Lipid Profile No results for input(s): "CHOL", "HDL", "LDLCALC", "TRIG", "CHOLHDL", "LDLDIRECT" in the last 72 hours. Thyroid function studies No results for input(s): "TSH", "T4TOTAL", "T3FREE", "THYROIDAB" in the last 72 hours.  Invalid input(s): "FREET3" Anemia work up No results for input(s): "VITAMINB12", "FOLATE", "FERRITIN", "TIBC", "IRON", "RETICCTPCT" in the last 72 hours. Urinalysis    Component Value Date/Time   COLORURINE STRAW (A) 07/06/2023  0630   APPEARANCEUR CLEAR (A) 07/06/2023 0630   APPEARANCEUR Cloudy 05/02/2012 2133   LABSPEC 1.011 07/06/2023 0630   LABSPEC 1.028 05/02/2012 2133   PHURINE 7.0 07/06/2023 0630   GLUCOSEU NEGATIVE 07/06/2023 0630   GLUCOSEU Negative 05/02/2012 2133   HGBUR NEGATIVE 07/06/2023 0630   BILIRUBINUR NEGATIVE 07/06/2023 0630   BILIRUBINUR Negative 05/02/2012 2133   KETONESUR NEGATIVE 07/06/2023 0630   PROTEINUR NEGATIVE 07/06/2023 0630   NITRITE NEGATIVE 07/06/2023 0630   LEUKOCYTESUR NEGATIVE 07/06/2023 0630   LEUKOCYTESUR Negative 05/02/2012 2133   Sepsis Labs Recent Labs  Lab 07/05/23 1944 07/06/23 0630 07/07/23 0357  WBC 15.1* 10.0 9.0   Microbiology Recent Results (from the past 240 hour(s))  Resp panel by RT-PCR (RSV, Flu A&B, Covid) Anterior Nasal Swab      Status: None   Collection Time: 07/05/23  7:44 PM   Specimen: Anterior Nasal Swab  Result Value Ref Range Status   SARS Coronavirus 2 by RT PCR NEGATIVE NEGATIVE Final    Comment: (NOTE) SARS-CoV-2 target nucleic acids are NOT DETECTED.  The SARS-CoV-2 RNA is generally detectable in upper respiratory specimens during the acute phase of infection. The lowest concentration of SARS-CoV-2 viral copies this assay can detect is 138 copies/mL. A negative result does not preclude SARS-Cov-2 infection and should not be used as the sole basis for treatment or other patient management decisions. A negative result may occur with  improper specimen collection/handling, submission of specimen other than nasopharyngeal swab, presence of viral mutation(s) within the areas targeted by this assay, and inadequate number of viral copies(<138 copies/mL). A negative result must be combined with clinical observations, patient history, and epidemiological information. The expected result is Negative.  Fact Sheet for Patients:  BloggerCourse.com  Fact Sheet for Healthcare Providers:  SeriousBroker.it  This test is no t yet approved or cleared by the [place] FDA and  has been authorized for detection and/or diagnosis of SARS-CoV-2 by FDA under an Emergency Use Authorization (EUA). This EUA will remain  in effect (meaning this test can be used) for the duration of the COVID-19 declaration under Section 564(b)(1) of the Act, 21 U.S.C.section 360bbb-3(b)(1), unless the authorization is terminated  or revoked sooner.       Influenza A by PCR NEGATIVE NEGATIVE Final   Influenza B by PCR NEGATIVE NEGATIVE Final    Comment: (NOTE) The Xpert Xpress SARS-CoV-2/FLU/RSV plus assay is intended as an aid in the diagnosis of influenza from Nasopharyngeal swab specimens and should not be used as a sole basis for treatment. Nasal washings and aspirates are  unacceptable for Xpert Xpress SARS-CoV-2/FLU/RSV testing.  Fact Sheet for Patients: BloggerCourse.com  Fact Sheet for Healthcare Providers: SeriousBroker.it  This test is not yet approved or cleared by the [place] FDA and has been authorized for detection and/or diagnosis of SARS-CoV-2 by FDA under an Emergency Use Authorization (EUA). This EUA will remain in effect (meaning this test can be used) for the duration of the COVID-19 declaration under Section 564(b)(1) of the Act, 21 U.S.C. section 360bbb-3(b)(1), unless the authorization is terminated or revoked.     Resp Syncytial Virus by PCR NEGATIVE NEGATIVE Final    Comment: (NOTE) Fact Sheet for Patients: BloggerCourse.com  Fact Sheet for Healthcare Providers: SeriousBroker.it  This test is not yet approved or cleared by the [place] FDA and has been authorized for detection and/or diagnosis of SARS-CoV-2 by FDA under an Emergency Use Authorization (EUA). This EUA will remain in effect (meaning this test  can be used) for the duration of the COVID-19 declaration under Section 564(b)(1) of the Act, 21 U.S.C. section 360bbb-3(b)(1), unless the authorization is terminated or revoked.  Performed at [facility], [address] [place], [place] [address]   Blood Culture (routine x 2)     Status: None (Preliminary result)   Collection Time: 07/05/23  8:21 PM   Specimen: BLOOD  Result Value Ref Range Status   Specimen Description BLOOD BLOOD RIGHT HAND  Final   Special Requests   Final    BOTTLES DRAWN AEROBIC AND ANAEROBIC Blood Culture adequate volume   Culture   Final    NO GROWTH 2 DAYS Performed at [facility], [address], [place], [place] [address]    Report Status PENDING  Incomplete  Blood Culture (routine x 2)     Status: None (Preliminary result)   Collection Time: 07/06/23  1:00  AM   Specimen: BLOOD  Result Value Ref Range Status   Specimen Description BLOOD BLOOD LEFT ARM  Final   Special Requests   Final    BOTTLES DRAWN AEROBIC AND ANAEROBIC Blood Culture adequate volume   Culture   Final    NO GROWTH 1 DAY Performed at [facility], [address] [place], [place] [address]    Report Status PENDING  Incomplete  MRSA Next Gen by PCR, Nasal     Status: None   Collection Time: 07/06/23  7:58 AM   Specimen: Nasal Mucosa; Nasal Swab  Result Value Ref Range Status   MRSA by PCR Next Gen NOT DETECTED NOT DETECTED Final    Comment: (NOTE) The GeneXpert MRSA Assay (FDA approved for NASAL specimens only), is one component of a comprehensive MRSA colonization surveillance program. It is not intended to diagnose MRSA infection nor to guide or monitor treatment for MRSA infections. Test performance is not FDA approved in patients less than 41 years old. Performed at [facility], [address] [place], [place] [address]      Time coordinating discharge: Over 30 minutes  SIGNED:   Charise Killian, MD  [facility] 07/07/2023, 12:47 PM Pager   If 7PM-7AM, please contact night-coverage [email or web address]

## 2023-07-07 NOTE — Plan of Care (Signed)
  Problem: Respiratory: Goal: Ability to maintain adequate ventilation will improve Outcome: Progressing   Problem: Education: Goal: Knowledge of General Education information will improve Description: Including pain rating scale, medication(s)/side effects and non-pharmacologic comfort measures Outcome: Progressing   Problem: Clinical Measurements: Goal: Respiratory complications will improve Outcome: Progressing   Problem: Activity: Goal: Risk for activity intolerance will decrease Outcome: Progressing   Problem: Coping: Goal: Level of anxiety will decrease Outcome: Progressing

## 2023-07-07 NOTE — Evaluation (Addendum)
Physical Therapy Evaluation Patient Details Name: Tara Craig MRN: [id number] DOB: 1961-04-25 Today's Date: 07/07/2023  History of Present Illness  Pt is 62 y/o admitted 07/05/23 for HCAP. 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 PmHx includes: pneumonia, HTN, paroxysmal SVT, anxiety and depression, falls, recurrent syncope, and GI bleed from anastomotic ulcer.   Clinical Impression  Pt received in bed and agreed to PT session. Pt reported discomfort present in their back and ribs. Pt performed bed mobility SUP, STS without the use of AD CGA, amb ~172ft CGA, and completed stair negotiation CGA using a step to approach and bilat handrails. Edu on coughing and bed mobility techniques such as splinted coughing and performing a log roll, pt expressed understanding. Pt tolerated Tx well and will no longer need skilled PT sessions as all acute care PT goals have been met; D/C in house.         If plan is discharge home, recommend the following: Assist for transportation;Help with stairs or ramp for entrance   Can travel by private vehicle        Equipment Recommendations None recommended by PT  Recommendations for Other Services       Functional Status Assessment Patient has not had a recent decline in their functional status     Precautions / Restrictions Precautions Precautions: Fall Restrictions Weight Bearing Restrictions: No      Mobility  Bed Mobility                    Transfers                        Ambulation/Gait                  Stairs            Wheelchair Mobility     Tilt Bed    Modified Rankin (Stroke Patients Only)       Balance                                             Pertinent Vitals/Pain      Home Living Family/patient expects to be discharged to:: Private  residence Living Arrangements: Spouse/significant other Available Help at Discharge: Family Type of Home: House Home Access: Stairs to enter Entrance Stairs-Rails: Can reach both Entrance Stairs-Number of Steps: 4   Home Layout: One level Home Equipment: Agricultural consultant (2 wheels);Cane - single point;Shower seat;Hand held shower head      Prior Function Prior Level of Function : Independent/Modified Independent             Mobility Comments: Pt reports IND prior to admission ADLs Comments: Pt reports IND prior to admission     Extremity/Trunk Assessment   Upper Extremity Assessment Upper Extremity Assessment: Overall WFL for tasks assessed    Lower Extremity Assessment Lower Extremity Assessment: Overall WFL for tasks assessed       Communication   Communication Communication: No apparent difficulties Cueing Techniques: Verbal cues  Cognition  General Comments      Exercises     Assessment/Plan    PT Assessment Patient does not need any further PT services  PT Problem List         PT Treatment Interventions      PT Goals (Current goals can be found in the Care Plan section)  Acute Rehab PT Goals Patient Stated Goal: To go home PT Goal Formulation: With patient Time For Goal Achievement: 07/21/23 Potential to Achieve Goals: Good    Frequency       Co-evaluation               AM-PAC PT "6 Clicks" Mobility  Outcome Measure Help needed turning from your back to your side while in a flat bed without using bedrails?: None Help needed moving from lying on your back to sitting on the side of a flat bed without using bedrails?: None Help needed moving to and from a bed to a chair (including a wheelchair)?: None Help needed standing up from a chair using your arms (e.g., wheelchair or bedside chair)?: None Help needed to walk in hospital room?: None Help needed climbing 3-5 steps  with a railing? : A Little 6 Click Score: 23    End of Session   Activity Tolerance: Patient tolerated treatment well Patient left: in bed;with call bell/phone within reach;with bed alarm set Nurse Communication: Mobility status PT Visit Diagnosis: Difficulty in walking, not elsewhere classified (R26.2);Pain Pain - Right/Left: Right Pain - part of body:  (Back and rib pain)    Time: 1914-7829 PT Time Calculation (min) (ACUTE ONLY): 17 min   Charges:   PT Evaluation $PT Eval Low Complexity: 1 Low   PT General Charges $$ ACUTE PT VISIT: 1 Visit         Quinterious [employer] SPT, LAT, ATC  [place]. Fairly IV, PT, DPT Physical Therapist- [place]  [facility]  07/07/2023, 1:28 PM

## 2023-07-10 LAB — CULTURE, BLOOD (ROUTINE X 2)
Culture: NO GROWTH
Special Requests: ADEQUATE

## 2023-07-11 LAB — CULTURE, BLOOD (ROUTINE X 2)
Culture: NO GROWTH
Special Requests: ADEQUATE

## 2023-09-18 ENCOUNTER — Emergency Department: Payer: 59

## 2023-09-18 ENCOUNTER — Observation Stay
Admission: EM | Admit: 2023-09-18 | Discharge: 2023-09-20 | Disposition: A | Discharge status: Home / Self Care | Payer: 59 | Attending: Internal Medicine | Admitting: Internal Medicine

## 2023-09-18 ENCOUNTER — Other Ambulatory Visit: Payer: Self-pay

## 2023-09-18 DIAGNOSIS — I160 Hypertensive urgency: Secondary | ICD-10-CM

## 2023-09-18 DIAGNOSIS — Z79899 Other long term (current) drug therapy: Secondary | ICD-10-CM | POA: Insufficient documentation

## 2023-09-18 DIAGNOSIS — G629 Polyneuropathy, unspecified: Secondary | ICD-10-CM

## 2023-09-18 DIAGNOSIS — K219 Gastro-esophageal reflux disease without esophagitis: Secondary | ICD-10-CM | POA: Diagnosis not present

## 2023-09-18 DIAGNOSIS — E876 Hypokalemia: Secondary | ICD-10-CM | POA: Diagnosis present

## 2023-09-18 DIAGNOSIS — G9009 Other idiopathic peripheral autonomic neuropathy: Secondary | ICD-10-CM | POA: Insufficient documentation

## 2023-09-18 DIAGNOSIS — I10 Essential (primary) hypertension: Secondary | ICD-10-CM | POA: Diagnosis present

## 2023-09-18 DIAGNOSIS — F32A Depression, unspecified: Secondary | ICD-10-CM | POA: Diagnosis not present

## 2023-09-18 DIAGNOSIS — R296 Repeated falls: Secondary | ICD-10-CM

## 2023-09-18 DIAGNOSIS — F419 Anxiety disorder, unspecified: Secondary | ICD-10-CM | POA: Diagnosis not present

## 2023-09-18 DIAGNOSIS — G894 Chronic pain syndrome: Secondary | ICD-10-CM

## 2023-09-18 DIAGNOSIS — R4182 Altered mental status, unspecified: Principal | ICD-10-CM | POA: Diagnosis present

## 2023-09-18 DIAGNOSIS — F17210 Nicotine dependence, cigarettes, uncomplicated: Secondary | ICD-10-CM | POA: Insufficient documentation

## 2023-09-18 LAB — MAGNESIUM: Magnesium: 1.9 mg/dL (ref 1.7–2.4)

## 2023-09-18 LAB — CBC WITH DIFFERENTIAL/PLATELET
Abs Immature Granulocytes: 0.01 10*3/uL (ref 0.00–0.07)
Basophils Absolute: 0.1 10*3/uL (ref 0.0–0.1)
Basophils Relative: 1 %
Eosinophils Absolute: 0.1 10*3/uL (ref 0.0–0.5)
Eosinophils Relative: 1 %
HCT: 40 % (ref 36.0–46.0)
Hemoglobin: 12.7 g/dL (ref 12.0–15.0)
Immature Granulocytes: 0 %
Lymphocytes Relative: 11 %
Lymphs Abs: 0.7 10*3/uL (ref 0.7–4.0)
MCH: 27.4 pg (ref 26.0–34.0)
MCHC: 31.8 g/dL (ref 30.0–36.0)
MCV: 86.2 fL (ref 80.0–100.0)
Monocytes Absolute: 0.5 10*3/uL (ref 0.1–1.0)
Monocytes Relative: 7 %
Neutro Abs: 5.6 10*3/uL (ref 1.7–7.7)
Neutrophils Relative %: 80 %
Platelets: 298 10*3/uL (ref 150–400)
RBC: 4.64 MIL/uL (ref 3.87–5.11)
RDW: 13.7 % (ref 11.5–15.5)
WBC: 7 10*3/uL (ref 4.0–10.5)
nRBC: 0 % (ref 0.0–0.2)

## 2023-09-18 LAB — COMPREHENSIVE METABOLIC PANEL
ALT: 16 U/L (ref 0–44)
AST: 24 U/L (ref 15–41)
Albumin: 3.2 g/dL — ABNORMAL LOW (ref 3.5–5.0)
Alkaline Phosphatase: 99 U/L (ref 38–126)
Anion gap: 18 — ABNORMAL HIGH (ref 5–15)
BUN: 18 mg/dL (ref 8–23)
CO2: 23 mmol/L (ref 22–32)
Calcium: 8.9 mg/dL (ref 8.9–10.3)
Chloride: 96 mmol/L — ABNORMAL LOW (ref 98–111)
Creatinine, Ser: 0.62 mg/dL (ref 0.44–1.00)
GFR, Estimated: >60 mL/min (ref >60)
Glucose, Bld: 82 mg/dL (ref 70–99)
Potassium: 3.3 mmol/L — ABNORMAL LOW (ref 3.5–5.1)
Sodium: 137 mmol/L (ref 135–145)
Total Bilirubin: 1.6 mg/dL — ABNORMAL HIGH (ref 0.0–1.2)
Total Protein: 6.8 g/dL (ref 6.5–8.1)

## 2023-09-18 LAB — TROPONIN I (HIGH SENSITIVITY): Troponin I (High Sensitivity): 15 ng/L (ref <18)

## 2023-09-18 LAB — ETHANOL: Alcohol, Ethyl (B): <10 mg/dL (ref <10)

## 2023-09-18 MED ORDER — ACETAMINOPHEN 325 MG PO TABS: 650.0000 mg | ORAL_TABLET | Freq: Four times a day (QID) | ORAL | Status: DC | PRN

## 2023-09-18 MED ORDER — ONDANSETRON HCL 4 MG/2ML IJ SOLN: 4.0000 mg | Freq: Four times a day (QID) | INTRAMUSCULAR | Status: DC | PRN

## 2023-09-18 MED ORDER — TRAZODONE HCL 50 MG PO TABS: 25.0000 mg | ORAL_TABLET | Freq: Every evening | ORAL | Status: DC | PRN

## 2023-09-18 MED ORDER — MAGNESIUM HYDROXIDE 400 MG/5ML PO SUSP: 30.0000 mL | Freq: Every day | ORAL | Status: DC | PRN

## 2023-09-18 MED ORDER — ENOXAPARIN SODIUM 40 MG/0.4ML IJ SOSY
40.0000 mg | PREFILLED_SYRINGE | INTRAMUSCULAR | Status: DC
  Administered 2023-09-19 – 2023-09-20 (×2): 40 mg via SUBCUTANEOUS
  Filled 2023-09-18 (×2): qty 0.4

## 2023-09-18 MED ORDER — ONDANSETRON HCL 4 MG PO TABS: 4.0000 mg | ORAL_TABLET | Freq: Four times a day (QID) | ORAL | Status: DC | PRN

## 2023-09-18 MED ORDER — SODIUM CHLORIDE 0.9 % IV SOLN: INTRAVENOUS | Status: DC

## 2023-09-18 MED ORDER — ACETAMINOPHEN 650 MG RE SUPP: 650.0000 mg | Freq: Four times a day (QID) | RECTAL | Status: DC | PRN

## 2023-09-18 MED ORDER — LACTATED RINGERS IV BOLUS
1000.0000 mL | Freq: Once | INTRAVENOUS | Status: AC
  Administered 2023-09-18: 1000 mL via INTRAVENOUS

## 2023-09-18 NOTE — ED Notes (Signed)
 Spouse is here. He has been to the desk three times asking to come back to visit his wife. He was made aware she was not ready for visitors currently.
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
## 2023-09-19 ENCOUNTER — Observation Stay: Payer: 59

## 2023-09-19 ENCOUNTER — Encounter: Payer: Self-pay | Admitting: Family Medicine

## 2023-09-19 DIAGNOSIS — F419 Anxiety disorder, unspecified: Secondary | ICD-10-CM | POA: Diagnosis not present

## 2023-09-19 DIAGNOSIS — G629 Polyneuropathy, unspecified: Secondary | ICD-10-CM

## 2023-09-19 DIAGNOSIS — R4182 Altered mental status, unspecified: Secondary | ICD-10-CM | POA: Diagnosis not present

## 2023-09-19 DIAGNOSIS — I160 Hypertensive urgency: Secondary | ICD-10-CM

## 2023-09-19 DIAGNOSIS — I10 Essential (primary) hypertension: Secondary | ICD-10-CM | POA: Diagnosis not present

## 2023-09-19 DIAGNOSIS — R296 Repeated falls: Secondary | ICD-10-CM | POA: Diagnosis not present

## 2023-09-19 LAB — BASIC METABOLIC PANEL
Anion gap: 15 (ref 5–15)
BUN: 12 mg/dL (ref 8–23)
CO2: 23 mmol/L (ref 22–32)
Calcium: 8.7 mg/dL — ABNORMAL LOW (ref 8.9–10.3)
Chloride: 97 mmol/L — ABNORMAL LOW (ref 98–111)
Creatinine, Ser: 0.44 mg/dL (ref 0.44–1.00)
GFR, Estimated: >60 mL/min (ref >60)
Glucose, Bld: 84 mg/dL (ref 70–99)
Potassium: 3.4 mmol/L — ABNORMAL LOW (ref 3.5–5.1)
Sodium: 135 mmol/L (ref 135–145)

## 2023-09-19 LAB — URINALYSIS, ROUTINE W REFLEX MICROSCOPIC
Bilirubin Urine: NEGATIVE
Glucose, UA: NEGATIVE mg/dL
Hgb urine dipstick: NEGATIVE
Ketones, ur: 20 mg/dL — AB
Leukocytes,Ua: NEGATIVE
Nitrite: NEGATIVE
Protein, ur: NEGATIVE mg/dL
Specific Gravity, Urine: 1.012 (ref 1.005–1.030)
pH: 5 (ref 5.0–8.0)

## 2023-09-19 LAB — CBC
HCT: 40.5 % (ref 36.0–46.0)
Hemoglobin: 13 g/dL (ref 12.0–15.0)
MCH: 26.9 pg (ref 26.0–34.0)
MCHC: 32.1 g/dL (ref 30.0–36.0)
MCV: 83.9 fL (ref 80.0–100.0)
Platelets: 314 10*3/uL (ref 150–400)
RBC: 4.83 MIL/uL (ref 3.87–5.11)
RDW: 13.4 % (ref 11.5–15.5)
WBC: 7.5 10*3/uL (ref 4.0–10.5)
nRBC: 0 % (ref 0.0–0.2)

## 2023-09-19 LAB — URINE DRUG SCREEN, QUALITATIVE (ARMC ONLY)
Amphetamines, Ur Screen: NOT DETECTED
Barbiturates, Ur Screen: NOT DETECTED
Benzodiazepine, Ur Scrn: NOT DETECTED
Cannabinoid 50 Ng, Ur ~~LOC~~: NOT DETECTED
Cocaine Metabolite,Ur ~~LOC~~: NOT DETECTED
MDMA (Ecstasy)Ur Screen: NOT DETECTED
Methadone Scn, Ur: NOT DETECTED
Opiate, Ur Screen: NOT DETECTED
Phencyclidine (PCP) Ur S: NOT DETECTED
Tricyclic, Ur Screen: NOT DETECTED

## 2023-09-19 LAB — VITAMIN B12: Vitamin B-12: 1143 pg/mL — ABNORMAL HIGH (ref 180–914)

## 2023-09-19 LAB — TROPONIN I (HIGH SENSITIVITY): Troponin I (High Sensitivity): 14 ng/L (ref <18)

## 2023-09-19 MED ORDER — POTASSIUM CHLORIDE 20 MEQ PO PACK
40.0000 meq | PACK | Freq: Once | ORAL | Status: DC
  Filled 2023-09-19: qty 2

## 2023-09-19 MED ORDER — VENLAFAXINE HCL ER 75 MG PO CP24: 150.0000 mg | ORAL_CAPSULE | Freq: Every day | ORAL | Status: DC

## 2023-09-19 MED ORDER — BUSPIRONE HCL 10 MG PO TABS
30.0000 mg | ORAL_TABLET | Freq: Two times a day (BID) | ORAL | Status: DC
  Administered 2023-09-19 – 2023-09-20 (×3): 30 mg via ORAL
  Filled 2023-09-19 (×3): qty 3

## 2023-09-19 MED ORDER — SENNA 8.6 MG PO TABS
1.0000 | ORAL_TABLET | Freq: Two times a day (BID) | ORAL | Status: DC
  Administered 2023-09-19 – 2023-09-20 (×2): 8.6 mg via ORAL
  Filled 2023-09-19 (×3): qty 1

## 2023-09-19 MED ORDER — IRBESARTAN 150 MG PO TABS: 75.0000 mg | ORAL_TABLET | Freq: Every day | ORAL | Status: DC

## 2023-09-19 MED ORDER — METHOCARBAMOL 500 MG PO TABS: 500.0000 mg | ORAL_TABLET | Freq: Three times a day (TID) | ORAL | Status: DC | PRN

## 2023-09-19 MED ORDER — VENLAFAXINE HCL ER 75 MG PO CP24
150.0000 mg | ORAL_CAPSULE | Freq: Every day | ORAL | Status: DC
  Administered 2023-09-19 – 2023-09-20 (×2): 150 mg via ORAL
  Filled 2023-09-19 (×2): qty 2

## 2023-09-19 MED ORDER — VENLAFAXINE HCL ER 75 MG PO CP24: 75.0000 mg | ORAL_CAPSULE | Freq: Every day | ORAL | Status: DC

## 2023-09-19 MED ORDER — CELECOXIB 100 MG PO CAPS
100.0000 mg | ORAL_CAPSULE | Freq: Two times a day (BID) | ORAL | Status: DC
  Administered 2023-09-19 – 2023-09-20 (×2): 100 mg via ORAL
  Filled 2023-09-19 (×3): qty 1

## 2023-09-19 MED ORDER — LISINOPRIL 20 MG PO TABS
40.0000 mg | ORAL_TABLET | Freq: Every day | ORAL | Status: DC
Start: 2023-09-19 — End: 2023-09-20
  Administered 2023-09-19 – 2023-09-20 (×2): 40 mg via ORAL
  Filled 2023-09-19 (×2): qty 2

## 2023-09-19 MED ORDER — LORAZEPAM 2 MG/ML IJ SOLN
2.0000 mg | Freq: Once | INTRAMUSCULAR | Status: AC | PRN
  Administered 2023-09-19: 2 mg via INTRAVENOUS
  Filled 2023-09-19: qty 1

## 2023-09-19 MED ORDER — ENSURE ENLIVE PO LIQD
237.0000 mL | Freq: Two times a day (BID) | ORAL | Status: DC
  Administered 2023-09-20: 237 mL via ORAL

## 2023-09-19 MED ORDER — CHLORTHALIDONE 25 MG PO TABS
25.0000 mg | ORAL_TABLET | Freq: Every day | ORAL | Status: DC
  Administered 2023-09-20: 25 mg via ORAL
  Filled 2023-09-19 (×2): qty 1

## 2023-09-19 MED ORDER — BUPRENORPHINE HCL-NALOXONE HCL 8-2 MG SL SUBL
1.0000 | SUBLINGUAL_TABLET | Freq: Three times a day (TID) | SUBLINGUAL | Status: DC
  Administered 2023-09-19 – 2023-09-20 (×3): 1 via SUBLINGUAL
  Filled 2023-09-19 (×3): qty 1

## 2023-09-19 MED ORDER — VENLAFAXINE HCL ER 75 MG PO CP24
75.0000 mg | ORAL_CAPSULE | Freq: Every day | ORAL | Status: DC
  Administered 2023-09-19 – 2023-09-20 (×2): 75 mg via ORAL
  Filled 2023-09-19 (×2): qty 1

## 2023-09-19 MED ORDER — ALBUTEROL SULFATE (2.5 MG/3ML) 0.083% IN NEBU: 2.5000 mg | INHALATION_SOLUTION | Freq: Four times a day (QID) | RESPIRATORY_TRACT | Status: DC | PRN

## 2023-09-19 MED ORDER — PANTOPRAZOLE SODIUM 40 MG PO TBEC
40.0000 mg | DELAYED_RELEASE_TABLET | Freq: Every day | ORAL | Status: DC
  Administered 2023-09-19 – 2023-09-20 (×2): 40 mg via ORAL
  Filled 2023-09-19 (×2): qty 1

## 2023-09-19 MED ORDER — VITAMIN B-12 1000 MCG PO TABS
1000.0000 ug | ORAL_TABLET | Freq: Every day | ORAL | Status: DC
  Administered 2023-09-19 – 2023-09-20 (×2): 1000 ug via ORAL
  Filled 2023-09-19 (×2): qty 1

## 2023-09-19 MED ORDER — FERROUS SULFATE 325 (65 FE) MG PO TABS
325.0000 mg | ORAL_TABLET | Freq: Every day | ORAL | Status: DC
  Administered 2023-09-20: 325 mg via ORAL
  Filled 2023-09-19: qty 1
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
## 2023-09-19 NOTE — Progress Notes (Signed)
 [facility]  - [place] at [facility]   PATIENT NAME: Tara Craig    MR#:  [id number]  DATE OF BIRTH:  [birth year]  SUBJECTIVE:  spoke with patient's husband on the phone and got update. Patient is alert and oriented to place person. She is disoriented to time. Answered most of my questions appropriately. Did not remember when she got married or how many years ago. Brought in by husband with confusion ongoing for last few days. But forget take things out of the microwave, forget to eat, husband not aware of how and what meds and if she is taking her medications correctly. Patient is awake she is alert did eat some. Denies any issues with safety at home. She earlier told me she is eager to meet her husband and wants to go home. Husband tells me she has been falling lately. Her doctors at [facility] have decreased dose of her gabapentin . Patient is on a lot of medications including meds for anxiety depression.   VITALS:  Blood pressure (!) 169/94, pulse 92, temperature 98 F (36.7 C), temperature source Oral, resp. rate 20, height 5' (1.524 m), weight 50 kg, SpO2 100%.  PHYSICAL EXAMINATION:   GENERAL:  63 y.o.-year-old patient with no acute distress.  LUNGS: Normal breath sounds bilaterally, no wheezing CARDIOVASCULAR: S1, S2 normal. No murmur   ABDOMEN: Soft, nontender, nondistended. Bowel sounds present.  EXTREMITIES: No  edema b/l.    NEUROLOGIC: nonfocal  patient is alert and awake oriented to place person. No focal weakness SKIN: per RN  LABORATORY PANEL:  CBC Recent Labs  Lab 09/19/23 0534  WBC 7.5  HGB 13.0  HCT 40.5  PLT 314    Chemistries  Recent Labs  Lab 09/18/23 2026 09/19/23 0534  NA 137 135  K 3.3* 3.4*  CL 96* 97*  CO2 23 23  GLUCOSE 82 84  BUN 18 12  CREATININE 0.62 0.44  CALCIUM 8.9 8.7*  MG 1.9  --   AST 24  --   ALT 16  --   ALKPHOS 99  --   BILITOT 1.6*  --    Cardiac Enzymes No results for input(s): "TROPONINI" in the last 168  hours. RADIOLOGY:  DG Chest Portable 1 View Result Date: 09/18/2023 CLINICAL DATA:  Altered mental status.  Prior right lobectomy. EXAM: PORTABLE CHEST 1 VIEW COMPARISON:  Chest radiograph dated 07/06/2023. FINDINGS: Probable small loculated right apical hydropneumothorax. Postsurgical changes of the right upper lobectomy. Overall decreased right lung volume. Blunting of the right costophrenic angle consistent with a small effusion. No new consolidation. The left lung is clear. Stable cardiac silhouette. Atherosclerotic calcification of the aorta. Osteopenia with degenerative changes spine. Left shoulder arthroplasty. Postsurgical changes of the right ribs as seen previously. IMPRESSION: Postsurgical changes of right upper lobectomy with a small right hydropneumothorax similar to prior radiograph. No new consolidation. Electronically Signed   By: Angus Bark M.D.   On: 09/18/2023 20:41   CT Cervical Spine Wo Contrast Result Date: 09/18/2023 CLINICAL DATA:  Neck trauma. EXAM: CT CERVICAL SPINE WITHOUT CONTRAST TECHNIQUE: Multidetector CT imaging of the cervical spine was performed without intravenous contrast. Multiplanar CT image reconstructions were also generated. RADIATION DOSE REDUCTION: This exam was performed according to the departmental dose-optimization program which includes automated exposure control, adjustment of the mA and/or kV according to patient size and/or use of iterative reconstruction technique. COMPARISON:  CT dated 10/13/2018. FINDINGS: Alignment: No acute subluxation. Skull base and vertebrae: No acute fracture. Soft tissues and  spinal canal: No prevertebral fluid or swelling. No visible canal hematoma. Disc levels:  C5-C6 disc spacer and C6-C7 ACDF. Upper chest: None. Other: Bilateral carotid bulb atherosclerotic plaques. IMPRESSION: 1. No acute/traumatic cervical spine pathology. 2. C5-C6 disc spacer and C6-C7 ACDF. Electronically Signed   By: Angus Bark M.D.   On:  09/18/2023 20:32   CT Head Wo Contrast Result Date: 09/18/2023 CLINICAL DATA:  Altered mental status. EXAM: CT HEAD WITHOUT CONTRAST TECHNIQUE: Contiguous axial images were obtained from the base of the skull through the vertex without intravenous contrast. RADIATION DOSE REDUCTION: This exam was performed according to the departmental dose-optimization program which includes automated exposure control, adjustment of the mA and/or kV according to patient size and/or use of iterative reconstruction technique. COMPARISON:  October 17, 2017 FINDINGS: Brain: No evidence of acute infarction, hemorrhage, hydrocephalus, extra-axial collection or mass lesion/mass effect. Vascular: No hyperdense vessel or unexpected calcification. Skull: Normal. Negative for fracture or focal lesion. Sinuses/Orbits: Stable bilateral maxillary sinus polyps versus mucous retention cysts are seen. Mild right ethmoid sinus mucosal thickening is also noted. Other: None. IMPRESSION: 1. No acute intracranial abnormality. 2. Stable bilateral maxillary sinus polyps versus mucous retention cysts. 3. Mild right ethmoid sinus disease. Electronically Signed   By: Virgle Grime M.D.   On: 09/18/2023 20:28    Assessment and Plan Tara Craig is a 63 y.o. female with medical history significant for asthma, tobacco abuse, chronic pain syndrome, depression, SVT and osteoarthritis, who presented to the emergency room with acute onset of altered mental status with excessive sleepiness and confusion over the last couple days per her husband who has contacted EMS.  He reported that she has been having multiple falls.  Patient has history of chronic pain syndrome. She follows up with orthopedic and pain clinic at [facility]. Patient also is followed by behavioral medicine outreach program by [facility] for her depression anxiety.  In discussion with the patient and husband separately patient has been on multiple medications. Recently her gabapentin  dose was  decreased from 900 mg three times a day to 600 mg BID. She also has been having multiple falls. Husband tells me patient takes PCP orders also. Per husband patient has been having some issues with memory. She forgets for example food in the microwave to eat afterwards warmed up. Husband was not able to tell me if she is taking extra meds. He does not do her medications. I asked couple times in the emergency room if she is safe at home patient said she is safe and did not indicate any evidence of physical/domestic violence. Please see TOC note for details.  Altered mental status/confusion unclear etiology could be polypharmacy, possible cognitive decline no source of infection identified -- CT head nothing acute -- CT cervical spine nothing acute -- will do MRI of the brain -- consider psych /Neurology consult if needed --check B12 --TSH in July 2024--WNL  Chronic pain syndrome chronic neuropathy -- will decrease dose of gabapentin  two 300 BID for now -- continue suboxone  as per prescription while her pain clinic at [facility] -- PRN Robaxin   Hypertension -- resume home meds--lisinopril  and chlorthalidone  Anxiety depression -- continue BuSpar  and venlafaxin   Frequent falls without major injury -- will have PT OT see patient --?gabapentin  related  TOC for discharge planning-- please see TOC detail note  Procedures: Family communication : husband Doranna Czerwonka Consults : none CODE STATUS: full DVT Prophylaxis : Lovenox  Level of care: Telemetry Medical Status is: Observation The patient remains  OBS appropriate and will d/c before 2 midnights.    TOTAL TIME TAKING CARE OF THIS PATIENT: 35 minutes.  >50% time spent on counselling and coordination of care  Note: This dictation was prepared with Dragon dictation along with smaller phrase technology. Any transcriptional errors that result from this process are unintentional.  Melvinia Stager M.D    [facility]   CC: Primary  care physician; Whelan, Karina Zaveri, MD

## 2023-09-19 NOTE — Progress Notes (Signed)
 No IV access. Patient pulled out two IVs today.

## 2023-09-19 NOTE — Assessment & Plan Note (Signed)
 -  We will continue PPI therapy

## 2023-09-19 NOTE — Assessment & Plan Note (Addendum)
--  This is associated with confusion and somnolence. - This could be related to Suboxone  given for chronic pain syndrome. - We will have to consider head injury with multiple falls. - She will be admitted to a medical telemetry observation bed. - We will follow neurochecks every 4 hours for 24 hours. - We will hold off sedatives. - PT consult to be obtained. - With lack of mental status she may benefit from neurology consult.

## 2023-09-19 NOTE — Assessment & Plan Note (Signed)
 -  We will continue Neurontin. ?

## 2023-09-19 NOTE — H&P (Addendum)
 [place]   PATIENT NAME: Tara Craig    MR#:  [id number]  DATE OF BIRTH:  [birth year]  DATE OF ADMISSION:  09/18/2023  PRIMARY CARE PHYSICIAN: Cheryn Rufus Curly, MD   Patient is coming from: Home  REQUESTING/REFERRING PHYSICIAN: Willo Dunnings, MD  CHIEF COMPLAINT:   Chief Complaint  Patient presents with   Altered Mental Status   Weakness    HISTORY OF PRESENT ILLNESS:  Tara Craig is a 63 y.o. female with medical history significant for asthma, tobacco abuse, chronic pain syndrome, depression, SVT and osteoarthritis, who presented to the emergency room with acute onset of altered mental status with excessive sleepiness and confusion over the last couple days per her husband who has contacted EMS.  He reported that she has been having multiple falls.  When EMS arrived on scene the patient was found locked in her bedroom, curled up in bed.  She has not been eating or drinking for a couple of days per EMS.  She reportedly stated to EMS that she did not feel safe at home.  She admitted to being started on Suboxone  a couple days ago.  No fever or chills.  No nausea or vomiting or abdominal pain.  She denied any cough or wheezing or dyspnea.  She was somnolent but arousable during my interview.  No dysuria, oliguria or hematuria or flank pain. . ED Course: When she came to the ER, vital signs were within normal and later BP was 149/81 with respiratory rate of 22.  Labs revealed hypokalemia at 3.3 and hypochloremia of 96 with albumin 3.2 and 12 bili of 1.6.  High sensitive troponin was 15 and later 14.  CBC was within normal.  UA was unremarkable. EKG as reviewed by me : None Imaging: Noncontrast head CT scan showed mild right ethmoid sinus disease and stable bilateral maxillary sinus polyps versus mucous retention cysts with no acute intracranial normality.  C-spine CT showed C5-C6 a disc spacer and C6-C7 ACDF with no acute traumatic cervical spine pathology.  Portable chest  ray showed postsurgical changes of right upper lobectomy with a small right hydropneumothorax similar to prior radiograph with no new consolidation.  The patient was given 1 L bolus of IV lactated Ringer .  She will be admitted to a medical telemetry observation bed for further evaluation and management. PAST MEDICAL HISTORY:   Past Medical History:  Diagnosis Date   Acute upper GI bleeding 03/01/2023   Arthritis    Asthma    Back pain, chronic    Bilateral foot-drop    Hypokalemia 03/03/2023   Hypotension 03/03/2023   Skin cancer     PAST SURGICAL HISTORY:   Past Surgical History:  Procedure Laterality Date   ABDOMINAL HYSTERECTOMY     ANKLE SURGERY Right    APPENDECTOMY     BACK SURGERY  08/09/2008   ESOPHAGOGASTRODUODENOSCOPY (EGD) WITH PROPOFOL  N/A 03/02/2023   Procedure: ESOPHAGOGASTRODUODENOSCOPY (EGD) WITH PROPOFOL ;  Surgeon: Unk Corinn Skiff, MD;  Location: [facility] ENDOSCOPY;  Service: Gastroenterology;  Laterality: N/A;   HEMOSTASIS CONTROL  03/02/2023   Procedure: HEMOSTASIS CONTROL;  Surgeon: Unk Corinn Skiff, MD;  Location: [facility] ENDOSCOPY;  Service: Gastroenterology;;   INTRAMEDULLARY (IM) NAIL INTERTROCHANTERIC Left 02/26/2023   Procedure: INTRAMEDULLARY (IM) NAIL INTERTROCHANTERIC;  Surgeon: Cleotilde Barrio, MD;  Location: [facility] ORS;  Service: Orthopedics;  Laterality: Left;   LUNG SURGERY Right 06/20/2023   TENNIS ELBOW RELEASE/NIRSCHEL PROCEDURE      SOCIAL HISTORY:   Social History  Tobacco Use   Smoking status: Every Day    Current packs/day: 1.50    Types: Cigarettes   Smokeless tobacco: Never  Substance Use Topics   Alcohol use: No    FAMILY HISTORY:   Family History  Problem Relation Age of Onset   Cancer Mother        lung   Heart disease Father     DRUG ALLERGIES:   Allergies  Allergen Reactions   Pumpkin Seed Oil Anaphylaxis    Other Reaction(s): Unknown   Butrans  [Buprenorphine ] Other (See Comments)    Patient states she  developed a severe rash to the adhesive on the patch. She tolerates Buprenorphine     Baclofen Other (See Comments)    Significant somnolence   Cyclobenzaprine Other (See Comments)    Significant somnolence (pass out)   Pregabalin     Other Reaction(s): Other (See Comments)  Significant somnolence (pass out)   Pumpkin Flavoring Agent (Non-Screening)    Amlodipine Other (See Comments)    Lower extremity swelling, even at 5mg  dose   Atenolol Other (See Comments)    fatigue   Tramadol Nausea And Vomiting, Other (See Comments) and Tinitus    Other Reaction(s): Hallucinations    REVIEW OF SYSTEMS:   ROS As per history of present illness. All pertinent systems were reviewed above. Constitutional, HEENT, cardiovascular, respiratory, GI, GU, musculoskeletal, neuro, psychiatric, endocrine, integumentary and hematologic systems were reviewed and are otherwise negative/unremarkable except for positive findings mentioned above in the HPI.   MEDICATIONS AT HOME:   Prior to Admission medications   Medication Sig Start Date End Date Taking? Authorizing Provider  acetaminophen  (TYLENOL ) 325 MG tablet Take 2 tablets (650 mg total) by mouth every 6 (six) hours as needed for mild pain, fever or headache. 03/03/23  Yes Fausto Burnard LABOR, DO  albuterol  (VENTOLIN  HFA) 108 (90 Base) MCG/ACT inhaler Inhale 2 puffs into the lungs every 6 (six) hours as needed for wheezing or shortness of breath. 06/23/23 06/22/24 Yes [provider]  Buprenorphine  HCl-Naloxone  HCl 8-2 MG FILM Place 1 strip under the tongue 3 (three) times daily. 06/25/23  Yes [provider]  busPIRone  (BUSPAR ) 30 MG tablet Take 30 mg by mouth 2 (two) times daily. 08/13/23  Yes [provider]  celecoxib  (CELEBREX ) 100 MG capsule Take 100 mg by mouth 2 (two) times daily. 09/16/23 09/15/24 Yes [provider]  chlorthalidone  (HYGROTON ) 25 MG tablet Take 25 mg by mouth daily. 07/23/23  Yes [provider]   cyanocobalamin  1000 MCG tablet Take 1,000 mcg by mouth daily.   Yes [provider]  ferrous sulfate  325 (65 FE) MG tablet Take 1 tablet (325 mg total) by mouth daily with breakfast. 03/04/23  Yes Fausto Burnard A, DO  gabapentin  (NEURONTIN ) 300 MG capsule Take 900 mg by mouth 3 (three) times daily.   Yes Whelan, Karina Zaveri, MD  lisinopril  (ZESTRIL ) 40 MG tablet Take 40 mg by mouth daily. 11/23/22  Yes [provider]  meloxicam  (MOBIC ) 15 MG tablet Take 15 mg by mouth daily. Take with a meal.   Yes [provider]  methocarbamol  (ROBAXIN ) 500 MG tablet Take 1 tablet by mouth 3 (three) times daily as needed.   Yes [provider]  omeprazole (PRILOSEC) 40 MG capsule Take 40 mg by mouth 2 (two) times daily.   Yes [provider]  ondansetron  (ZOFRAN ) 4 MG tablet Take 4 mg by mouth every 8 (eight) hours as needed. 06/28/23  Yes [provider]  oxyCODONE -acetaminophen  (PERCOCET) 5-325 MG tablet Take 1-2 tablets by mouth every 6 (six) hours as needed for severe pain or moderate pain. 03/03/23 03/02/24 Yes Fausto Burnard LABOR, DO  potassium chloride  SA (KLOR-CON  M) 20 MEQ tablet Take 20 mEq by mouth 4 (four) times daily. 06/28/23  Yes [provider]  senna (SENOKOT) 8.6 MG TABS tablet Take 1 tablet (8.6 mg total) by mouth 2 (two) times daily. 03/03/23  Yes Fausto Burnard A, DO  valsartan (DIOVAN) 80 MG tablet Take 80 mg by mouth daily. 06/24/23  Yes [provider]  venlafaxine  XR (EFFEXOR -XR) 150 MG 24 hr capsule Take 150 mg by mouth daily. Take along with one 75 mg capsule for total 225 mg once daily 04/05/23  Yes [provider]  venlafaxine  XR (EFFEXOR -XR) 75 MG 24 hr capsule Take 75 mg by mouth daily. Take along with one 150 mg capsule for total 225 mg once daily 04/05/23  Yes [provider]  clobetasol ointment (TEMOVATE) 0.05 % Apply the medication twice daily to open areas or blisters on the leegs Patient not  taking: Reported on 07/06/2023 01/11/23   [provider]  nystatin (MYCOSTATIN) 100000 UNIT/ML suspension Take 5 mLs by mouth 4 (four) times daily.    [provider]  Varenicline Tartrate, Starter, 0.5 MG X 11 & 1 MG X 42 TBPK Take by mouth as directed. Patient not taking: Reported on 09/19/2023 05/09/23   [provider]      VITAL SIGNS:  Blood pressure (!) 170/72, pulse (!) 58, temperature 98.9 F (37.2 C), temperature source Oral, resp. rate 20, height 5' (1.524 m), weight 50 kg, SpO2 99%.  PHYSICAL EXAMINATION:  Physical Exam  GENERAL:  63 y.o.-year-old Caucasian female patient lying in the bed with no acute distress.  She is very somnolent but arousable. EYES: Pupils equal, round, reactive to light and accommodation. No scleral icterus. Extraocular muscles intact.  HEENT: Head atraumatic, normocephalic. Oropharynx and nasopharynx clear.  NECK:  Supple, no jugular venous distention. No thyroid enlargement, no tenderness.  LUNGS: Normal breath sounds bilaterally, no wheezing, rales,rhonchi or crepitation. No use of accessory muscles of respiration.  CARDIOVASCULAR: Regular rate and rhythm, S1, S2 normal. No murmurs, rubs, or gallops.  ABDOMEN: Soft, nondistended, nontender. Bowel sounds present. No organomegaly or mass.  EXTREMITIES: No pedal edema, cyanosis, or clubbing.  NEUROLOGIC: She was somnolent but arousable.  She will able to move all upper and lower extremities.  She had no lateralizing signs. PSYCHIATRIC: The patient is somnolent but arousable with no good eye contact. SKIN: No obvious rash, lesion, or ulcer.   LABORATORY PANEL:   CBC Recent Labs  Lab 09/18/23 2026  WBC 7.0  HGB 12.7  HCT 40.0  PLT 298   ------------------------------------------------------------------------------------------------------------------  Chemistries  Recent Labs  Lab 09/18/23 2026  NA 137  K 3.3*  CL 96*  CO2 23  GLUCOSE 82  BUN 18  CREATININE 0.62   CALCIUM 8.9  MG 1.9  AST 24  ALT 16  ALKPHOS 99  BILITOT 1.6*   ------------------------------------------------------------------------------------------------------------------  Cardiac Enzymes No results for input(s): TROPONINI in the last 168 hours. ------------------------------------------------------------------------------------------------------------------  RADIOLOGY:  DG Chest Portable 1 View Result Date: 09/18/2023 CLINICAL DATA:  Altered mental status.  Prior right lobectomy. EXAM: PORTABLE CHEST 1 VIEW COMPARISON:  Chest radiograph dated 07/06/2023. FINDINGS: Probable small loculated right apical hydropneumothorax. Postsurgical changes of the right upper lobectomy. Overall decreased right lung volume. Blunting of the right costophrenic angle consistent  with a small effusion. No new consolidation. The left lung is clear. Stable cardiac silhouette. Atherosclerotic calcification of the aorta. Osteopenia with degenerative changes spine. Left shoulder arthroplasty. Postsurgical changes of the right ribs as seen previously. IMPRESSION: Postsurgical changes of right upper lobectomy with a small right hydropneumothorax similar to prior radiograph. No new consolidation. Electronically Signed   By: Vanetta Chou M.D.   On: 09/18/2023 20:41   CT Cervical Spine Wo Contrast Result Date: 09/18/2023 CLINICAL DATA:  Neck trauma. EXAM: CT CERVICAL SPINE WITHOUT CONTRAST TECHNIQUE: Multidetector CT imaging of the cervical spine was performed without intravenous contrast. Multiplanar CT image reconstructions were also generated. RADIATION DOSE REDUCTION: This exam was performed according to the departmental dose-optimization program which includes automated exposure control, adjustment of the mA and/or kV according to patient size and/or use of iterative reconstruction technique. COMPARISON:  CT dated 10/13/2018. FINDINGS: Alignment: No acute subluxation. Skull base and vertebrae: No acute  fracture. Soft tissues and spinal canal: No prevertebral fluid or swelling. No visible canal hematoma. Disc levels:  C5-C6 disc spacer and C6-C7 ACDF. Upper chest: None. Other: Bilateral carotid bulb atherosclerotic plaques. IMPRESSION: 1. No acute/traumatic cervical spine pathology. 2. C5-C6 disc spacer and C6-C7 ACDF. Electronically Signed   By: Vanetta Chou M.D.   On: 09/18/2023 20:32   CT Head Wo Contrast Result Date: 09/18/2023 CLINICAL DATA:  Altered mental status. EXAM: CT HEAD WITHOUT CONTRAST TECHNIQUE: Contiguous axial images were obtained from the base of the skull through the vertex without intravenous contrast. RADIATION DOSE REDUCTION: This exam was performed according to the departmental dose-optimization program which includes automated exposure control, adjustment of the mA and/or kV according to patient size and/or use of iterative reconstruction technique. COMPARISON:  October 17, 2017 FINDINGS: Brain: No evidence of acute infarction, hemorrhage, hydrocephalus, extra-axial collection or mass lesion/mass effect. Vascular: No hyperdense vessel or unexpected calcification. Skull: Normal. Negative for fracture or focal lesion. Sinuses/Orbits: Stable bilateral maxillary sinus polyps versus mucous retention cysts are seen. Mild right ethmoid sinus mucosal thickening is also noted. Other: None. IMPRESSION: 1. No acute intracranial abnormality. 2. Stable bilateral maxillary sinus polyps versus mucous retention cysts. 3. Mild right ethmoid sinus disease. Electronically Signed   By: Suzen Dials M.D.   On: 09/18/2023 20:28      IMPRESSION AND PLAN:  Assessment and Plan: * Altered mental status --This is associated with confusion and somnolence. - This could be related to Suboxone  given for chronic pain syndrome. - We will have to consider head injury with multiple falls. - She will be admitted to a medical telemetry observation bed. - We will follow neurochecks every 4 hours for 24  hours. - We will hold off sedatives. - PT consult to be obtained. - With lack of mental status she may benefit from neurology consult.  Hypertensive urgency - BP has been as high as 195/113. - She will be continued on her antihypertensive therapy. - Will monitor for opiate withdrawal. - We will place her on as needed IV hydralazine.  Recurrent falls - This associated with contusions. - Given concern about safety at home, case management consult to be obtained to rule out physical abuse. -Physical therapy consult to be obtained to assess her ambulation.  Hypokalemia - Potassium will be replaced.  Anxiety and depression - We will continue BuSpar  and Effexor  XR  Peripheral neuropathy - We will continue Neurontin .  GERD without esophagitis - We will continue PPI therapy.    DVT prophylaxis: Lovenox .  Advanced Care  Planning:  Code Status: full code.  Family Communication:  The plan of care was discussed in details with the patient (and family). I answered all questions. The patient agreed to proceed with the above mentioned plan. Further management will depend upon hospital course. Disposition Plan: Back to previous home environment Consults called: none;  All the records are reviewed and case discussed with ED provider.  Status is: Observation   I certify that at the time of admission, it is my clinical judgment that the patient will require  hospital care extending less than 2 midnights.                            Dispo: The patient is from: Home              Anticipated d/c is to: Home              Patient currently is not medically stable to d/c.              Difficult to place patient: No  [place] DELENA Peaches M.D on 09/19/2023 at 4:02 AM  [facility]   From 7 PM-7 AM, contact night-coverage [email or web address]  CC: Primary care physician; Whelan, Karina Zaveri, MD

## 2023-09-19 NOTE — ED Notes (Signed)
 Attempted in and out cath with assistance from emilee rn without any assistance

## 2023-09-19 NOTE — ED Notes (Signed)
 Pt was assisted from one bed to a hospital bed. Pt was able to bear weight. Bed alarm applied, fall risk band, side rails up, and pt is near the nurses station for fall prevention measures.
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
## 2023-09-19 NOTE — Assessment & Plan Note (Deleted)
-   We will continue antihypertensive therapy.

## 2023-09-19 NOTE — Assessment & Plan Note (Signed)
-   We will continue BuSpar  and Effexor  XR

## 2023-09-19 NOTE — Assessment & Plan Note (Signed)
-   BP has been as high as 195/113. - She will be continued on her antihypertensive therapy. - Will monitor for opiate withdrawal. - We will place her on as needed IV hydralazine.

## 2023-09-19 NOTE — Assessment & Plan Note (Signed)
 repleted   Serial BMP

## 2023-09-19 NOTE — Evaluation (Signed)
 Clinical/Bedside Swallow Evaluation Patient Details  Name: Tara Craig MRN: [id number] Date of Birth: 14-Mar-1961  Today's Date: 09/19/2023 Time: SLP Start Time (ACUTE ONLY): 0930 SLP Stop Time (ACUTE ONLY): 1030 SLP Time Calculation (min) (ACUTE ONLY): 60 min  Past Medical History:  Past Medical History:  Diagnosis Date   Acute upper GI bleeding 03/01/2023   Arthritis    Asthma    Back pain, chronic    Bilateral foot-drop    Hypokalemia 03/03/2023   Hypotension 03/03/2023   Skin cancer    Past Surgical History:  Past Surgical History:  Procedure Laterality Date   ABDOMINAL HYSTERECTOMY     ANKLE SURGERY Right    APPENDECTOMY     BACK SURGERY  08/09/2008   ESOPHAGOGASTRODUODENOSCOPY (EGD) WITH PROPOFOL  N/A 03/02/2023   Procedure: ESOPHAGOGASTRODUODENOSCOPY (EGD) WITH PROPOFOL ;  Surgeon: Selena Daily, MD;  Location: [facility] ENDOSCOPY;  Service: Gastroenterology;  Laterality: N/A;   HEMOSTASIS CONTROL  03/02/2023   Procedure: HEMOSTASIS CONTROL;  Surgeon: Selena Daily, MD;  Location: [facility] ENDOSCOPY;  Service: Gastroenterology;;   INTRAMEDULLARY (IM) NAIL INTERTROCHANTERIC Left 02/26/2023   Procedure: INTRAMEDULLARY (IM) NAIL INTERTROCHANTERIC;  Surgeon: Marlynn Singer, MD;  Location: [facility] ORS;  Service: Orthopedics;  Laterality: Left;   LUNG SURGERY Right 06/20/2023   TENNIS ELBOW RELEASE/NIRSCHEL PROCEDURE     HPI:  Pt is a 63 y.o. female with medical history significant for emphysema/asthma/lung nodule, tobacco abuse, chronic pain syndrome, Opioid dependence on maintenance agonist therapy per chart, depression and hallucinations - sees [employer] in the community per chart, SVT and osteoarthritis, hip fx per chart, prior sleep apnea and obesity w/ gastric bypass procedure in ~2013 per chart notes who presented to the emergency room with acute onset of altered mental status with excessive sleepiness and confusion over the last couple days per her  husband who has contacted EMS.  He reported that she has been having multiple falls.  When EMS arrived on scene the patient was found locked in her bedroom, curled up in bed.  She has not been eating or drinking for a couple of days per EMS.  She reportedly stated to EMS that she did not feel safe at home.  She admitted to being started on Suboxone  a couple days ago.  She was somnolent but arousable during ED admit.   CXR this admit: Postsurgical changes of right upper lobectomy with a small right  hydropneumothorax similar to prior radiograph. No new consolidation.    Assessment / Plan / Recommendation  Clinical Impression   Pt seen for BSE this morning. Pt awakened easily w/ verbal cues but required Time b/f fully opening eyes -- noted she drank w/ eyes closed often. Mumbled speech prevelent. She was oriented to name and city she lived in only. Hand over hand cues. Per chart note, pt has a h/o gastric bypass surgery.  On RA, afebrile. WBC WNL.   Pt appears to present w/ mild oral phase dysphagia; grossly functional pharyngeal phase swallowing w/ No immediate, overt clinical s/s of aspiration during po trials. Suspect sensorimotor deficits d/t impact of declined Mental Status currently. Pt required MOD+ cues and hand over hand support consuming po's. Pt appears at reduced risk for aspiration following general aspiration precautions w/ a modified/Dysphagia diet at this time.  Pt appears to have challenging factors that could impact her oropharyngeal swallowing to include Chronic Pain/discomfort(per chart), deconditioning/weakness, and Mental Status decline hampering self-feeding abilities. These factors can increase risk for dysphagia as well as decreased  oral intake overall.   During po trials, pt consumed consistencies w/ no overt coughing, decline in vocal quality, or change in respiratory presentation during/post trials. O2 sats remained mid-upper 90s when checked. Oral phase appeared grossly [facility] w/  timely bolus management and control of bolus propulsion for A-P transfer for swallowing w/ Thin liquids consumed. However, increased oral phase time w/ slight Munching pattern and decreased oral awareness was noted w/ increased textured food trials. Pt eventually cleared orally given Time and alternating w/ a puree/liquid trial.  OM Exam was cursory d/t reduced follow through w/ some OM tasks but appeared grossly [facility] w/ no unilateral weakness noted. Speech Clear, low volume and mumbled slightly. Pt helped to hold Cup to feed self w/ support.   Recommend a Pureed consistency diet (dysphagia level 1) w/ well-moistened foods; Thin liquids -- carefully monitor straw use, and pt should help to Hold Cup when drinking. Recommend general aspiration precautions, reduced distractions at meals. Feeding support but w/ pt helping to feed self. Give po's only when fully alert/awake. Pills CRUSHED in Puree for safer, easier swallowing.  Education given on Pills in Puree; food consistencies and easy to eat options; general aspiration precautions to pt. ST services will f/u w/ toleration of diet and trials to upgrade diet as able to. MD/NSG updated, agreed. Recommend Dietician f/u for support d/t h/o gastric bypass surgery and weight loss. SLP Visit Diagnosis: Dysphagia, oral phase (R13.11) (impact from declined mental status)    Aspiration Risk  Mild aspiration risk;Risk for inadequate nutrition/hydration    Diet Recommendation   Thin;Dysphagia 1 (puree) = a Pureed consistency diet (dysphagia level 1) w/ well-moistened foods; Thin liquids -- carefully monitor straw use, and pt should help to Hold Cup when drinking. Recommend general aspiration precautions, reduced distractions at meals. Feeding support but w/ pt helping to feed self. Give po's only when fully alert/awake.   Medication Administration: Crushed with puree    Other  Recommendations Recommended Consults:  (Dietician) Oral Care Recommendations: Oral care  BID;Oral care before and after PO;Staff/trained caregiver to provide oral care ([employer] care)    Recommendations for follow up therapy are one component of a multi-disciplinary discharge planning process, led by the attending physician.  Recommendations may be updated based on patient status, additional functional criteria and insurance authorization.  Follow up Recommendations No SLP follow up (expected)      Assistance Recommended at Discharge  FULL  Functional Status Assessment Patient has had a recent decline in their functional status and demonstrates the ability to make significant improvements in function in a reasonable and predictable amount of time.  Frequency and Duration min 2x/week  2 weeks       Prognosis Prognosis for improved oropharyngeal function: Fair (-Good) Barriers to Reach Goals: Time post onset;Severity of deficits;Behavior;Motivation (mental status)      Swallow Study   General Date of Onset: 09/18/23 HPI: Pt is a 63 y.o. female with medical history significant for emphysema/asthma/lung nodule, tobacco abuse, chronic pain syndrome, Opioid dependence on maintenance agonist therapy per chart, depression and hallucinations - sees [employer] in the community per chart, SVT and osteoarthritis, hip fx per chart, prior sleep apnea and obesity w/ gastric bypass procedure in ~2013 per chart notes who presented to the emergency room with acute onset of altered mental status with excessive sleepiness and confusion over the last couple days per her husband who has contacted EMS.  He reported that she has been having multiple falls.  When EMS  arrived on scene the patient was found locked in her bedroom, curled up in bed.  She has not been eating or drinking for a couple of days per EMS.  She reportedly stated to EMS that she did not feel safe at home.  She admitted to being started on Suboxone  a couple days ago.  She was somnolent but arousable during ED admit.   CXR  this admit: Postsurgical changes of right upper lobectomy with a small right  hydropneumothorax similar to prior radiograph. No new consolidation. Type of Study: Bedside Swallow Evaluation Previous Swallow Assessment: none Diet Prior to this Study: NPO Temperature Spikes Noted: No (wbc 7.5) Respiratory Status: Room air History of Recent Intubation: No Behavior/Cognition: Alert;Cooperative;Pleasant mood;Confused;Distractible;Requires cueing (unsure of baseline mental status) Oral Cavity Assessment: Dry Oral Care Completed by SLP: Yes Oral Cavity - Dentition: Dentures, top;Dentures, bottom (present) Vision: Functional for self-feeding (when eyes were open) Self-Feeding Abilities: Able to feed self;Needs assist;Needs set up;Total assist Patient Positioning: Upright in bed (MAX assist) Baseline Vocal Quality: Low vocal intensity (mumbled slightly) Volitional Cough:  (Fair) Volitional Swallow: Able to elicit    Oral/Motor/Sensory Function Overall Oral Motor/Sensory Function: Within functional limits   Ice Chips Ice chips: Within functional limits Presentation: Spoon (fed; 3 trials)   Thin Liquid Thin Liquid: Within functional limits Presentation: Self Fed;Cup;Straw (5 trials via each)    Nectar Thick Nectar Thick Liquid: Not tested   Honey Thick Honey Thick Liquid: Not tested   Puree Puree: Impaired Presentation: Spoon (fed; 7-8 trials) Oral Phase Impairments:  (lengthy) Oral Phase Functional Implications: Prolonged oral transit Pharyngeal Phase Impairments:  (none)   Solid     Solid: Impaired Presentation: Spoon (fed; 3 trials) Oral Phase Impairments: Poor awareness of bolus;Impaired mastication Oral Phase Functional Implications: Impaired mastication;Prolonged oral transit Pharyngeal Phase Impairments:  (none)        Darla Edward, MS, CCC-SLP Speech Language Pathologist Rehab Services; [facility] -  [phone] (ascom) Dionicio Shelnutt 09/19/2023,12:08  PM

## 2023-09-20 DIAGNOSIS — G894 Chronic pain syndrome: Secondary | ICD-10-CM

## 2023-09-20 DIAGNOSIS — R4182 Altered mental status, unspecified: Secondary | ICD-10-CM | POA: Diagnosis not present

## 2023-09-20 DIAGNOSIS — R296 Repeated falls: Secondary | ICD-10-CM | POA: Diagnosis not present

## 2023-09-20 DIAGNOSIS — F419 Anxiety disorder, unspecified: Secondary | ICD-10-CM | POA: Diagnosis not present

## 2023-09-20 MED ORDER — ENSURE ENLIVE PO LIQD: 237.0000 mL | Freq: Two times a day (BID) | ORAL | 12 refills | Status: DC

## 2023-09-20 MED ORDER — GABAPENTIN 300 MG PO CAPS
300.0000 mg | ORAL_CAPSULE | Freq: Two times a day (BID) | ORAL | Status: DC
  Administered 2023-09-20: 300 mg via ORAL
  Filled 2023-09-20: qty 1

## 2023-09-20 MED ORDER — GABAPENTIN 300 MG PO CAPS: 300.0000 mg | ORAL_CAPSULE | Freq: Two times a day (BID) | ORAL | 0 refills | Status: AC

## 2023-09-20 NOTE — TOC Transition Note (Signed)
Transition of Care [facility]) - Discharge Note   Patient Details  Name: Tara Craig MRN: [id number] Date of Birth: 19-Dec-1960  [facility] CM/SW Contact:  Allena Katz, LCSW Phone Number: 09/20/2023, 10:55 AM   Clinical Narrative:   Pt has orders to discharge home. CSW spoke with patient about HH. Pt reports she does not want this. She has a RW and BSC at home and does not have any further needs.    Final next level of care: Home/Self Care Barriers to Discharge: Barriers Resolved   Patient Goals and CMS Choice Patient states their goals for this hospitalization and ongoing recovery are:: return home with husband CMS Medicare.gov Compare Post Acute Care list provided to:: Patient Choice offered to / list presented to : Patient      Discharge Placement                    Patient and family notified of of transfer: 09/20/23  Discharge Plan and Services Additional resources added to the After Visit Summary for                                       Social Drivers of Health (SDOH) Interventions SDOH Screenings   Food Insecurity: Patient Unable To Answer (09/19/2023)  Housing: Patient Unable To Answer (09/19/2023)  Transportation Needs: Patient Unable To Answer (09/19/2023)  Utilities: Patient Unable To Answer (09/19/2023)  Financial Resource Strain: Medium Risk (08/30/2022)   [facility]  Physical Activity: Inactive (05/16/2020)   Received from [facility]  Social Connections: Unknown (09/19/2023)  Stress: No Stress Concern Present (05/16/2020)   Received from [facility]  Tobacco Use: High Risk (09/19/2023)   [facility] from [facility] Literacy: Low Risk  (08/30/2022)   Received from [facility]     Readmission Risk Interventions     No data to display
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
## 2023-09-20 NOTE — Discharge Summary (Signed)
Physician Discharge Summary   Patient: Tara Craig MRN: [id number] DOB: December 06, 1960  Admit date:     09/18/2023  Discharge date: 09/20/23  Discharge Physician: Tara Craig   PCP: Tara Pardon, MD   Recommendations at discharge:   F/u with your [facility] pain clinic F/u with [facility] behavioral medicine outreach program on your appt follow-up [facility] PCP in 1 to 2 week  Discharge Diagnoses: Principal Problem:   Altered mental status Active Problems:   Recurrent falls   Hypertensive urgency   Hypokalemia   Hypertension   Anxiety and depression   GERD without esophagitis   Peripheral neuropathy   Tara Craig is a 63 y.o. female with medical history significant for asthma, tobacco abuse, chronic pain syndrome, depression, SVT and osteoarthritis, who presented to the emergency room with acute onset of altered mental status with excessive sleepiness and confusion over the last couple days per her husband who has contacted EMS.  He reported that she has been having multiple falls.   Patient has history of chronic pain syndrome. She follows up with orthopedic and pain clinic at [facility]. Patient also is followed by behavioral medicine outreach program by [facility] for her depression anxiety.   In discussion with the patient and husband separately patient has been on multiple medications. Recently her gabapentin dose was decreased from 900 mg three times a day to 600 mg BID. She also has been having multiple falls.   Altered mental status/confusion unclear etiology suspect polypharmacy, possible cognitive decline no source of infection identified -- CT head nothing acute -- CT cervical spine nothing acute -- MRI of the brain nothing acute -- patient is alert and oriented times three today. Worked with physical therapy occupational therapy and speech therapy. Seems back to her baseline --B12 1143 --TSH in July 2024--WNL   Chronic pain syndrome chronic neuropathy -- will decrease dose of  gabapentin two 300 BID for now -- continue suboxone as per prescription while her pain clinic at [facility] -- PRN Robaxin   Hypertension -- resume home meds--lisinopril and chlorthalidone  Anxiety depression -- continue BuSpar and venlafaxin    Frequent falls without injury -- will have PT OT see patient-- recommends home health PT OT. --?gabapentin related   discharge plan discussed with patient and patient's husband Tara Craig on the phone. Patient will follow-up with her [facility] physicians on her appointment.   Code status full Family communication : husband Tara Craig     Pain control - [employer] Controlled Substance Reporting System database was reviewed. and patient was instructed, not to drive, operate heavy machinery, perform activities at heights, swimming or participation in water activities or provide baby-sitting services while on Pain, Sleep and Anxiety Medications; until their outpatient Physician has advised to do so again. Also recommended to not to take more than prescribed Pain, Sleep and Anxiety Medications.  Consultants: none Procedures performed: none Disposition: Home health Diet recommendation:  Discharge Diet Orders (From admission, onward)     Start     Ordered   09/20/23 0000  Diet general        09/20/23 1041           Regular diet DISCHARGE MEDICATION: Allergies as of 09/20/2023       Reactions   Pumpkin Seed Oil Anaphylaxis   Other Reaction(s): Unknown   Butrans [buprenorphine] Other (See Comments)   Patient states she developed a severe rash to the adhesive on the patch. She tolerates Buprenorphine    Baclofen Other (  See Comments)   Significant somnolence   Cyclobenzaprine Other (See Comments)   Significant somnolence (pass out)   Pregabalin    Other Reaction(s): Other (See Comments) Significant somnolence (pass out)   Pumpkin Flavoring Agent (non-screening)    Amlodipine Other (See Comments)   Lower extremity swelling, even at  5mg  dose   Atenolol Other (See Comments)   fatigue   Tramadol Nausea And Vomiting, Other (See Comments), Tinitus   Other Reaction(s): Hallucinations        Medication List     STOP taking these medications    meloxicam 15 MG tablet Commonly known as: MOBIC   valsartan 80 MG tablet Commonly known as: DIOVAN       TAKE these medications    acetaminophen 325 MG tablet Commonly known as: TYLENOL Take 2 tablets (650 mg total) by mouth every 6 (six) hours as needed for mild pain, fever or headache.   albuterol 108 (90 Base) MCG/ACT inhaler Commonly known as: VENTOLIN HFA Inhale 2 puffs into the lungs every 6 (six) hours as needed for wheezing or shortness of breath.   Buprenorphine HCl-Naloxone HCl 8-2 MG Film Place 1 strip under the tongue 3 (three) times daily.   busPIRone 30 MG tablet Commonly known as: BUSPAR Take 30 mg by mouth 2 (two) times daily.   celecoxib 100 MG capsule Commonly known as: CELEBREX Take 100 mg by mouth 2 (two) times daily.   chlorthalidone 25 MG tablet Commonly known as: HYGROTON Take 25 mg by mouth daily.   cyanocobalamin 1000 MCG tablet Take 1,000 mcg by mouth daily.   feeding supplement Liqd Take 237 mLs by mouth 2 (two) times daily between meals.   ferrous sulfate 325 (65 FE) MG tablet Take 1 tablet (325 mg total) by mouth daily with breakfast.   gabapentin 300 MG capsule Commonly known as: NEURONTIN Take 1 capsule (300 mg total) by mouth 2 (two) times daily. What changed:  how much to take when to take this   lisinopril 40 MG tablet Commonly known as: ZESTRIL Take 40 mg by mouth daily.   methocarbamol 500 MG tablet Commonly known as: ROBAXIN Take 1 tablet by mouth 3 (three) times daily as needed.   nystatin 100000 UNIT/ML suspension Commonly known as: MYCOSTATIN Take 5 mLs by mouth 4 (four) times daily.   omeprazole 40 MG capsule Commonly known as: PRILOSEC Take 40 mg by mouth 2 (two) times daily.   ondansetron 4  MG tablet Commonly known as: ZOFRAN Take 4 mg by mouth every 8 (eight) hours as needed.   oxyCODONE-acetaminophen 5-325 MG tablet Commonly known as: Percocet Take 1-2 tablets by mouth every 6 (six) hours as needed for severe pain or moderate pain.   potassium chloride SA 20 MEQ tablet Commonly known as: KLOR-CON M Take 20 mEq by mouth 4 (four) times daily.   senna 8.6 MG Tabs tablet Commonly known as: SENOKOT Take 1 tablet (8.6 mg total) by mouth 2 (two) times daily.   venlafaxine XR 150 MG 24 hr capsule Commonly known as: EFFEXOR-XR Take 150 mg by mouth daily. Take along with one 75 mg capsule for total 225 mg once daily   venlafaxine XR 75 MG 24 hr capsule Commonly known as: EFFEXOR-XR Take 75 mg by mouth daily. Take along with one 150 mg capsule for total 225 mg once daily        Follow-up Information     Tara Pardon, MD. Schedule an appointment as soon as possible for  a visit.   Specialties: Internal Medicine, Pediatrics Contact information: [id number]; [address] [place] [address] [phone]                Discharge Exam: Ceasar Mons Weights   09/18/23 1948  Weight: 50 kg   Alert awake and oriented times three respiratory clear to auscultation  cardiovascular both heart sounds normal no murmur neuro- grossly intact  Condition at discharge: fair  The results of significant diagnostics from this hospitalization (including imaging, microbiology, ancillary and laboratory) are listed below for reference.   Imaging Studies: MR BRAIN WO CONTRAST Result Date: 09/19/2023 CLINICAL DATA:  Mental status change, unknown cause. Confusion for a few days. EXAM: MRI HEAD WITHOUT CONTRAST TECHNIQUE: Multiplanar, multiecho pulse sequences of the brain and surrounding structures were obtained without intravenous contrast. COMPARISON:  Head CT 09/18/2023 FINDINGS: Due to patient confusion, multiple sequences are mildly to moderately  motion degraded. A coronal T2 sequence was not submitted. Brain: There is no evidence of an acute infarct, intracranial hemorrhage, mass, midline shift, or extra-axial fluid collection. Patchy T2 hyperintensities in the cerebral white matter and pons are nonspecific but compatible with mild-to-moderate chronic small vessel ischemic disease. Cerebral volume is within normal limits for age. The ventricles are normal in size. Vascular: Major intracranial vascular flow voids are preserved. Skull and upper cervical spine: No suspicious marrow lesion. Sinuses/Orbits: Unremarkable orbits. Small mucous retention cysts in the maxillary sinuses. Opacification of a posterior right ethmoid air cell. No significant mastoid fluid. Other: None. IMPRESSION: 1. No acute intracranial abnormality. 2. Mild-to-moderate chronic small vessel ischemic disease. Electronically Signed   By: Sebastian Ache M.D.   On: 09/19/2023 18:03   DG Chest Portable 1 View Result Date: 09/18/2023 CLINICAL DATA:  Altered mental status.  Prior right lobectomy. EXAM: PORTABLE CHEST 1 VIEW COMPARISON:  Chest radiograph dated 07/06/2023. FINDINGS: Probable small loculated right apical hydropneumothorax. Postsurgical changes of the right upper lobectomy. Overall decreased right lung volume. Blunting of the right costophrenic angle consistent with a small effusion. No new consolidation. The left lung is clear. Stable cardiac silhouette. Atherosclerotic calcification of the aorta. Osteopenia with degenerative changes spine. Left shoulder arthroplasty. Postsurgical changes of the right ribs as seen previously. IMPRESSION: Postsurgical changes of right upper lobectomy with a small right hydropneumothorax similar to prior radiograph. No new consolidation. Electronically Signed   By: Elgie Collard M.D.   On: 09/18/2023 20:41   CT Cervical Spine Wo Contrast Result Date: 09/18/2023 CLINICAL DATA:  Neck trauma. EXAM: CT CERVICAL SPINE WITHOUT CONTRAST TECHNIQUE:  Multidetector CT imaging of the cervical spine was performed without intravenous contrast. Multiplanar CT image reconstructions were also generated. RADIATION DOSE REDUCTION: This exam was performed according to the departmental dose-optimization program which includes automated exposure control, adjustment of the mA and/or kV according to patient size and/or use of iterative reconstruction technique. COMPARISON:  CT dated 10/13/2018. FINDINGS: Alignment: No acute subluxation. Skull base and vertebrae: No acute fracture. Soft tissues and spinal canal: No prevertebral fluid or swelling. No visible canal hematoma. Disc levels:  C5-C6 disc spacer and C6-C7 ACDF. Upper chest: None. Other: Bilateral carotid bulb atherosclerotic plaques. IMPRESSION: 1. No acute/traumatic cervical spine pathology. 2. C5-C6 disc spacer and C6-C7 ACDF. Electronically Signed   By: Elgie Collard M.D.   On: 09/18/2023 20:32   CT Head Wo Contrast Result Date: 09/18/2023 CLINICAL DATA:  Altered mental status. EXAM: CT HEAD WITHOUT CONTRAST TECHNIQUE: Contiguous axial images were obtained from the  base of the skull through the vertex without intravenous contrast. RADIATION DOSE REDUCTION: This exam was performed according to the departmental dose-optimization program which includes automated exposure control, adjustment of the mA and/or kV according to patient size and/or use of iterative reconstruction technique. COMPARISON:  October 17, 2017 FINDINGS: Brain: No evidence of acute infarction, hemorrhage, hydrocephalus, extra-axial collection or mass lesion/mass effect. Vascular: No hyperdense vessel or unexpected calcification. Skull: Normal. Negative for fracture or focal lesion. Sinuses/Orbits: Stable bilateral maxillary sinus polyps versus mucous retention cysts are seen. Mild right ethmoid sinus mucosal thickening is also noted. Other: None. IMPRESSION: 1. No acute intracranial abnormality. 2. Stable bilateral maxillary sinus polyps versus  mucous retention cysts. 3. Mild right ethmoid sinus disease. Electronically Signed   By: Aram Candela M.D.   On: 09/18/2023 20:28    Microbiology: Results for orders placed or performed during the hospital encounter of 07/05/23  Resp panel by RT-PCR (RSV, Flu A&B, Covid) Anterior Nasal Swab     Status: None   Collection Time: 07/05/23  7:44 PM   Specimen: Anterior Nasal Swab  Result Value Ref Range Status   SARS Coronavirus 2 by RT PCR NEGATIVE NEGATIVE Final    Comment: (NOTE) SARS-CoV-2 target nucleic acids are NOT DETECTED.  The SARS-CoV-2 RNA is generally detectable in upper respiratory specimens during the acute phase of infection. The lowest concentration of SARS-CoV-2 viral copies this assay can detect is 138 copies/mL. A negative result does not preclude SARS-Cov-2 infection and should not be used as the sole basis for treatment or other patient management decisions. A negative result may occur with  improper specimen collection/handling, submission of specimen other than nasopharyngeal swab, presence of viral mutation(s) within the areas targeted by this assay, and inadequate number of viral copies(<138 copies/mL). A negative result must be combined with clinical observations, patient history, and epidemiological information. The expected result is Negative.  Fact Sheet for Patients:  BloggerCourse.com  Fact Sheet for Healthcare Providers:  SeriousBroker.it  This test is no t yet approved or cleared by the [place] FDA and  has been authorized for detection and/or diagnosis of SARS-CoV-2 by FDA under an Emergency Use Authorization (EUA). This EUA will remain  in effect (meaning this test can be used) for the duration of the COVID-19 declaration under Section 564(b)(1) of the Act, 21 U.S.C.section 360bbb-3(b)(1), unless the authorization is terminated  or revoked sooner.       Influenza A by PCR NEGATIVE  NEGATIVE Final   Influenza B by PCR NEGATIVE NEGATIVE Final    Comment: (NOTE) The Xpert Xpress SARS-CoV-2/FLU/RSV plus assay is intended as an aid in the diagnosis of influenza from Nasopharyngeal swab specimens and should not be used as a sole basis for treatment. Nasal washings and aspirates are unacceptable for Xpert Xpress SARS-CoV-2/FLU/RSV testing.  Fact Sheet for Patients: BloggerCourse.com  Fact Sheet for Healthcare Providers: SeriousBroker.it  This test is not yet approved or cleared by the [place] FDA and has been authorized for detection and/or diagnosis of SARS-CoV-2 by FDA under an Emergency Use Authorization (EUA). This EUA will remain in effect (meaning this test can be used) for the duration of the COVID-19 declaration under Section 564(b)(1) of the Act, 21 U.S.C. section 360bbb-3(b)(1), unless the authorization is terminated or revoked.     Resp Syncytial Virus by PCR NEGATIVE NEGATIVE Final    Comment: (NOTE) Fact Sheet for Patients: BloggerCourse.com  Fact Sheet for Healthcare Providers: SeriousBroker.it  This test is not yet approved or  cleared by the [place] and has been authorized for detection and/or diagnosis of SARS-CoV-2 by FDA under an Emergency Use Authorization (EUA). This EUA will remain in effect (meaning this test can be used) for the duration of the COVID-19 declaration under Section 564(b)(1) of the Act, 21 U.S.C. section 360bbb-3(b)(1), unless the authorization is terminated or revoked.  Performed at [facility], [address] [place], [place] [address]   Blood Culture (routine x 2)     Status: None   Collection Time: 07/05/23  8:21 PM   Specimen: BLOOD  Result Value Ref Range Status   Specimen Description BLOOD BLOOD RIGHT HAND  Final   Special Requests   Final    BOTTLES DRAWN AEROBIC AND ANAEROBIC Blood  Culture adequate volume   Culture   Final    NO GROWTH 5 DAYS Performed at [facility], [address], [place], [place] [address]    Report Status 07/10/2023 FINAL  Final  Blood Culture (routine x 2)     Status: None   Collection Time: 07/06/23  1:00 AM   Specimen: BLOOD  Result Value Ref Range Status   Specimen Description BLOOD BLOOD LEFT ARM  Final   Special Requests   Final    BOTTLES DRAWN AEROBIC AND ANAEROBIC Blood Culture adequate volume   Culture   Final    NO GROWTH 5 DAYS Performed at [facility], [address] [place], [place] [address]    Report Status 07/11/2023 FINAL  Final  MRSA Next Gen by PCR, Nasal     Status: None   Collection Time: 07/06/23  7:58 AM   Specimen: Nasal Mucosa; Nasal Swab  Result Value Ref Range Status   MRSA by PCR Next Gen NOT DETECTED NOT DETECTED Final    Comment: (NOTE) The GeneXpert MRSA Assay (FDA approved for NASAL specimens only), is one component of a comprehensive MRSA colonization surveillance program. It is not intended to diagnose MRSA infection nor to guide or monitor treatment for MRSA infections. Test performance is not FDA approved in patients less than 31 years old. Performed at [facility], [address] [place], [place] [address]     Labs: CBC: Recent Labs  Lab 09/18/23 2026 09/19/23 0534  WBC 7.0 7.5  NEUTROABS 5.6  --   HGB 12.7 13.0  HCT 40.0 40.5  MCV 86.2 83.9  PLT 298 314   Basic Metabolic Panel: Recent Labs  Lab 09/18/23 2026 09/19/23 0534  NA 137 135  K 3.3* 3.4*  CL 96* 97*  CO2 23 23  GLUCOSE 82 84  BUN 18 12  CREATININE 0.62 0.44  CALCIUM 8.9 8.7*  MG 1.9  --    Liver Function Tests: Recent Labs  Lab 09/18/23 2026  AST 24  ALT 16  ALKPHOS 99  BILITOT 1.6*  PROT 6.8  ALBUMIN 3.2*    Discharge time spent: greater than 30 minutes.  Signed: Enedina Finner, MD [facility] 09/20/2023

## 2023-09-20 NOTE — Discharge Instructions (Signed)
F/u with your [facility] pain clinic F/u with [facility] behavioral medicine outreach program on your appt

## 2023-09-20 NOTE — Progress Notes (Signed)
Speech Language Pathology Treatment: Dysphagia  Patient Details Name: Tara Craig MRN: [id number] DOB: [birth year] Today's Date: 09/20/2023 Time: 8295-6213 SLP Time Calculation (min) (ACUTE ONLY): 40 min  Assessment / Plan / Recommendation Clinical Impression  Pt seen for ongoing assessment of swallowing and toleration of diet w/ trials to upgrade diet if appropriate today. Pt awake, more alert than yesterday at BSE. She verbally engaged somewhat and answered a few Basic questions re: self. She was able to place her lower Denture plate once prepped w adhesive for her. She was not excited about upgrading her oral diet to regular foods from puree foods but said "sure" when asked if she wanted to. Husband present. Pt could not readily indicate a "favorite food" at home -- Husband stated she ate "only chicken noodle soup" at home. She would benefit from Dietician f/u for support.  Per chart note, pt has a h/o gastric bypass surgery.  On RA, afebrile. WBC WNL.    Pt appears to present w/ grossly functional oropharyngeal phase swallowing w/ No immediate, overt clinical s/s of aspiration during po trials. Suspect mild impact from her declined Mental Status on follow through w/ po/tasks in general. Pt required MIN+ cues for support during po trials and w/ placing lower Denture plate for eating foods.  Pt appears at reduced risk for aspiration following general aspiration precautions w/ an easy to eat (foods) diet at this time.  Pt appears to have challenging factors that could impact her oropharyngeal swallowing to include Chronic Pain/discomfort(per chart) w/ multiple Pain Medications, deconditioning/weakness, and Mental Status decline hampering general follow through w/ tasks. These factors can increase risk for dysphagia as well as decreased oral intake overall.    During po trials, pt consumed consistencies w/ no overt coughing, decline in vocal quality, or change in respiratory presentation  during/post trials. O2 sats remained mid-upper 90s when checked. Oral phase appeared grossly [facility] w/ timely bolus management and control of bolus propulsion for A-P transfer for swallowing w/ Thin liquids consumed. General oral phase presentation was grossly [facility] for bolus management and mastication of solids; slight Munching pattern and increased Time needed w/ increased textured food trials. Pt eventually cleared orally given Time and alternating w/ a liquid trial.  Pt helped to feed self given FULL setup support; she held Cup to feed self.    Recommend upgrade to a mech soft/regular consistency diet w/ well-moistened foods and cut meats/foods for ease of chewing; Thin liquids -- carefully monitor straw use, and pt should Hold Cup when drinking. Recommend general aspiration precautions, reduced distractions at meals. Feeding support if needed but w/ pt helping to feed self. Pills CRUSHED in Puree for safer, easier swallowing. Oral/Denture care.  Education given on Pills in Puree; food consistencies and easy to eat food options; general aspiration precautions to pt and Husband. ST services can be had at next venue of care if any swallowing concerns. Recommend f/u w/ Neurology d/t concern for Cognitive/mental status decline in general; also recommend Dietician f/u for nutrition support d/t h/o gastric bypass surgery, and still reduced oral intake w/ weight loss. MD/NSG updated, agreed.       HPI HPI: Pt is a 63 y.o. female with medical history significant for emphysema/asthma/lung nodule, tobacco abuse, chronic pain syndrome, Opioid dependence on maintenance agonist therapy per chart, depression and hallucinations - sees [employer] in the community per chart, SVT and osteoarthritis, hip fx per chart, prior sleep apnea and obesity w/ gastric bypass procedure in ~2013 per  chart notes who presented to the emergency room with acute onset of altered mental status with excessive sleepiness and  confusion over the last couple days per her husband who has contacted EMS.  He reported that she has been having multiple falls.  When EMS arrived on scene the patient was found locked in her bedroom, curled up in bed.  She has not been eating or drinking for a couple of days per EMS.  She reportedly stated to EMS that she did not feel safe at home.  She admitted to being started on Suboxone a couple days ago.  She was somnolent but arousable during ED admit.   CXR this admit: Postsurgical changes of right upper lobectomy with a small right  hydropneumothorax similar to prior radiograph. No new consolidation.      SLP Plan  All goals met;Consult other service (comment) (Neurology; Dietician)      Recommendations for follow up therapy are one component of a multi-disciplinary discharge planning process, led by the attending physician.  Recommendations may be updated based on patient status, additional functional criteria and insurance authorization.    Recommendations  Diet recommendations: Regular;Dysphagia 3 (mechanical soft);Thin liquid (cut/chopped meats moistened well for ease of chewing/eating) Liquids provided via: Cup;Straw Medication Administration: Crushed with puree Supervision: Patient able to self feed;Staff to assist with self feeding;Intermittent supervision to cue for compensatory strategies Compensations: Minimize environmental distractions;Slow rate;Small sips/bites;Lingual sweep for clearance of pocketing;Follow solids with liquid Postural Changes and/or Swallow Maneuvers: Out of bed for meals;Seated upright 90 degrees;Upright 30-60 min after meal (Reflux precautions)                 (Dietician f/u; Neurology f/u) Oral care BID;Oral care before and after PO;Staff/trained caregiver to provide oral care (Denture care)   Intermittent Supervision/Assistance Dysphagia, unspecified (R13.10) (declined mentation overall)     All goals met;Consult other service (comment)  (Neurology; Dietician)       Jerilynn Som, MS, CCC-SLP Speech Language Pathologist Rehab Services; [facility] [phone] (ascom) Cecilio Ohlrich  09/20/2023, 4:27 PM

## 2023-09-20 NOTE — Plan of Care (Signed)
  Problem: Pain Managment: Goal: General experience of comfort will improve and/or be controlled Outcome: Progressing

## 2024-04-01 ENCOUNTER — Ambulatory Visit (INDEPENDENT_AMBULATORY_CARE_PROVIDER_SITE_OTHER)

## 2024-04-01 ENCOUNTER — Ambulatory Visit: Admission: EM | Admit: 2024-04-01 | Discharge: 2024-04-01 | Disposition: A | Discharge status: Home / Self Care

## 2024-04-01 ENCOUNTER — Encounter: Payer: Self-pay | Admitting: Emergency Medicine

## 2024-04-01 DIAGNOSIS — S161XXA Strain of muscle, fascia and tendon at neck level, initial encounter: Secondary | ICD-10-CM

## 2024-04-01 DIAGNOSIS — M542 Cervicalgia: Secondary | ICD-10-CM

## 2024-04-01 NOTE — ED Provider Notes (Addendum)
 [facility]    CSN: [id number] Arrival date & time: 04/01/24  1311      History   Chief Complaint Chief Complaint  Patient presents with   Fall   Neck Pain    HPI Tara Craig is a 63 y.o. female.   HPI  63 year old female with past medical history significant for asthma, arthritis, GERD, hypertension, hyperlipidemia, PSVT, mild intermittent asthma, lumbar radiculopathy, sciatica, and degenerative disc disease with cervical discectomy and single level fusion presents for evaluation of neck pain, shoulder pain, and headache.  She reports that she fell out of bed last night and landed on her head.  Her bed is approximately 3 feet in the air.  She was able to get herself back into bed.  She denies any numbness, tingling, or weakness in any of her extremities.  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) 07/05/2023   Thrombocytosis 07/05/2023   History of recurrent syncope 07/05/2023   History of upper GI bleed 02/2023 from anastomotic ulcer 07/05/2023   Hydropneumothorax, right, post procedural 07/05/2023   Hypokalemia 03/03/2023   Gastrojejunal ulcer 03/02/2023   Acute blood loss anemia 03/01/2023   Degeneration of lumbar intervertebral disc 02/25/2023   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   Enthesopathy of hip region 02/25/2023   Inflammation of sacroiliac joint (HCC) 02/25/2023   Sciatica 02/25/2023   Candida esophagitis (HCC) 02/25/2023   Syncope  02/25/2023   Femur fracture (HCC) 02/25/2023   Anxiety and depression 02/25/2023   Lung nodule 02/25/2023   Recurrent falls 12/17/2022   Paroxysmal SVT (supraventricular tachycardia) (HCC) 06/30/2021   Sedative overdose 10/17/2017   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   Lumbar radiculopathy 09/27/2017   History of gastric bypass 05/03/2017   Mild intermittent asthma without complication 05/02/2017   Opioid dependence on maintenance agonist therapy, no symptoms (HCC) 05/02/2017   Primary insomnia 05/02/2017   Primary osteoarthritis involving multiple joints 05/02/2017   Severe episode of recurrent major depressive disorder, without psychotic features (HCC) 05/02/2017   Back pain, chronic 05/31/2012   Endometriosis 05/31/2012   GERD without esophagitis 05/31/2012   HLD (hyperlipidemia) 05/31/2012   Hypertension 05/31/2012   Tobacco use disorder 05/31/2012   SCC (squamous cell carcinoma), face 05/31/2012    Past Surgical History:  Procedure Laterality Date   ABDOMINAL HYSTERECTOMY     ANKLE SURGERY Right    APPENDECTOMY     BACK SURGERY  08/09/2008   ESOPHAGOGASTRODUODENOSCOPY (EGD) WITH PROPOFOL  N/A 03/02/2023   Procedure: ESOPHAGOGASTRODUODENOSCOPY (EGD) WITH PROPOFOL ;  Surgeon: Unk Corinn Skiff, MD;  Location: [facility] ENDOSCOPY;  Service: Gastroenterology;  Laterality: N/A;   HEMOSTASIS CONTROL  03/02/2023   Procedure: HEMOSTASIS CONTROL;  Surgeon: Unk Corinn Skiff, MD;  Location: [facility] ENDOSCOPY;  Service: Gastroenterology;;   INTRAMEDULLARY (IM) NAIL INTERTROCHANTERIC Left 02/26/2023   Procedure: INTRAMEDULLARY (IM) NAIL INTERTROCHANTERIC;  Surgeon: Cleotilde Barrio, MD;  Location: [facility] ORS;  Service: Orthopedics;  Laterality: Left;  LUNG SURGERY Right 06/20/2023   TENNIS ELBOW RELEASE/NIRSCHEL PROCEDURE      OB History   No obstetric history on file.      Home Medications    Prior to Admission medications   Medication Sig  Start Date End Date Taking? Authorizing Provider  Buprenorphine  HCl-Naloxone  HCl 8-2 MG FILM Place 1 strip under the tongue 3 (three) times daily. 06/25/23  Yes [provider]  busPIRone  (BUSPAR ) 30 MG tablet Take 30 mg by mouth 2 (two) times daily. 08/13/23  Yes [provider]  chlorthalidone  (HYGROTON ) 25 MG tablet Take 25 mg by mouth daily. 07/23/23  Yes [provider]  cyanocobalamin  1000 MCG tablet Take 1,000 mcg by mouth daily.   Yes [provider]  gabapentin  (NEURONTIN ) 300 MG capsule Take 1 capsule (300 mg total) by mouth 2 (two) times daily. 09/20/23  Yes Patel, Sona, MD  olmesartan (BENICAR) 40 MG tablet Take 40 mg by mouth daily. 01/09/24 01/08/25 Yes [provider]  omeprazole (PRILOSEC) 40 MG capsule Take 40 mg by mouth 2 (two) times daily.   Yes [provider]  acetaminophen  (TYLENOL ) 325 MG tablet Take 2 tablets (650 mg total) by mouth every 6 (six) hours as needed for mild pain, fever or headache. 03/03/23   Fausto Burnard LABOR, DO  albuterol  (VENTOLIN  HFA) 108 (90 Base) MCG/ACT inhaler Inhale 2 puffs into the lungs every 6 (six) hours as needed for wheezing or shortness of breath. 06/23/23 06/22/24  [provider]  celecoxib  (CELEBREX ) 100 MG capsule Take 100 mg by mouth 2 (two) times daily. 09/16/23 09/15/24  [provider]  feeding supplement (ENSURE ENLIVE / ENSURE PLUS) LIQD Take 237 mLs by mouth 2 (two) times daily between meals. 09/20/23   Patel, Sona, MD  ferrous sulfate  325 (65 FE) MG tablet Take 1 tablet (325 mg total) by mouth daily with breakfast. 03/04/23   Fausto Burnard A, DO  lisinopril  (ZESTRIL ) 40 MG tablet Take 40 mg by mouth daily. 11/23/22   [provider]  methocarbamol  (ROBAXIN ) 500 MG tablet Take 1 tablet by mouth 3 (three) times daily as needed.    [provider]  nystatin (MYCOSTATIN) 100000 UNIT/ML suspension Take 5 mLs by mouth 4 (four) times daily.    [provider]  ondansetron  (ZOFRAN ) 4 MG tablet Take 4 mg by mouth every 8 (eight) hours as needed. 06/28/23   [provider]  potassium chloride  SA (KLOR-CON  M) 20 MEQ tablet Take 20 mEq by mouth 4 (four) times daily. 06/28/23   [provider]  senna (SENOKOT) 8.6 MG TABS tablet Take 1 tablet (8.6 mg total) by mouth 2 (two) times daily. 03/03/23   Fausto Burnard LABOR, DO  venlafaxine  XR (EFFEXOR -XR) 150 MG 24 hr capsule Take 150 mg by mouth daily. Take along with one 75 mg capsule for total 225 mg once daily 04/05/23   [provider]  venlafaxine  XR (EFFEXOR -XR) 75 MG 24 hr capsule Take 75 mg by mouth daily. Take along with one 150 mg capsule for total 225 mg once daily 04/05/23   [provider]    Family History Family History  Problem Relation Age of Onset   Cancer Mother        lung   Heart disease Father     Social History Social History   Tobacco Use   Smoking status: Every Day    Current packs/day: 1.50    Types: Cigarettes   Smokeless tobacco: Never  Vaping Use   Vaping status: Never Used  Substance Use Topics   Alcohol use: No   Drug use: Yes    Comment: perscription oxycodone      Allergies   Pumpkin seed oil, Butrans  [buprenorphine ], Baclofen, Cyclobenzaprine, Pregabalin, Pumpkin flavoring agent (non-screening), Amlodipine, Atenolol, and Tramadol   Review of Systems Review of Systems  Musculoskeletal:  Positive for back pain and neck pain.  Neurological:  Positive for headaches.     Physical Exam Triage Vital Signs ED Triage Vitals  Encounter Vitals Group     BP      Girls Systolic BP Percentile      Girls Diastolic BP Percentile      Boys Systolic BP Percentile      Boys Diastolic BP Percentile      Pulse      Resp      Temp      Temp src      SpO2      Weight      Height      Head Circumference      Peak Flow      Pain Score      Pain Loc      Pain Education      Exclude from Growth Chart    No data  found.  Updated Vital Signs BP 123/80 (BP Location: Left Arm)   Pulse 79   Temp 97.8 F (36.6 C) (Oral)   Resp 16   Ht 5' (1.524 m)   Wt 110 lb 3.7 oz (50 kg)   SpO2 97%   BMI 21.53 kg/m   Visual Acuity Right Eye Distance:   Left Eye Distance:   Bilateral Distance:    Right Eye Near:   Left Eye Near:    Bilateral Near:     Physical Exam Vitals and nursing note reviewed.  Constitutional:      Appearance: Normal appearance. She is not ill-appearing.  HENT:     Head: Normocephalic and atraumatic.  Musculoskeletal:        General: Tenderness and signs of injury present.  Skin:    General: Skin is warm and dry.     Capillary Refill: Capillary refill takes less than 2 seconds.     Findings: No bruising or erythema.  Neurological:     General: No focal deficit present.     Mental Status: She is alert and oriented to person, place, and time.     Sensory: No sensory deficit.     Motor: No weakness.      UC Treatments / Results  Labs (all labs ordered are listed, but only abnormal results are displayed) Labs Reviewed - No data to display  EKG   Radiology No results found.  Procedures Procedures (including critical care time)  Medications Ordered in UC Medications - No data to display  Initial Impression / Assessment and Plan / UC Course  I have reviewed the triage vital signs and the nursing notes.  Pertinent labs & imaging results that were available during my care of the patient were reviewed by me and considered in my medical decision making (see chart for details).   Patient is a nontoxic-appearing 63 year old female presenting for evaluation of headache, neck, and shoulder pain after falling out of bed last night.  She has had a previous cervical discectomy with cervical fusion back in 2019.  She denies any numbness, tingling, weakness in any of her extremities.  She did ambulate into the room.  In  the exam room the patient has significant kyphosis but she  has no midline spinous process tenderness or step-off in her cervical or upper thoracic spine.  She does have some significant tension in the trapezius muscles bilaterally.  Bilateral grips are 5/5 and upper extremity checked is 5/5.  Bilateral lower extremity strength is also 5/5.  She has full range of motion of her neck with out complaints of pain.  I suspect this is most likely musculoskeletal in nature but given her previous cervical history I will obtain radiographs to evaluate for any acute bony abnormality.  Cervical spine films independently reviewed and evaluated by me.  Impression: No evidence of fracture or dislocation.  Normal cervical lordosis.  Cervical fusion plate present at C6-7 and disc spacer at C5-C6 appear intact.  Radiology read is pending. Radiology impression states no acute fracture or subluxation.  I will discharge patient home with a diagnosis of cervical strain and have her use ibuprofen  every 6 hours as needed for pain and inflammation.  She has allergies to both baclofen and Flexeril as they both cause significant somnolence so I will not send her home on a muscle relaxer at this time.   Final Clinical Impressions(s) / UC Diagnoses   Final diagnoses:  Neck pain  Acute strain of neck muscle, initial encounter     Discharge Instructions      Your x-rays did not show any evidence of broken bones.  Your cervical fusion and disc spacer are intact.  Your exam is consistent with a cervical strain.  Use over-the-counter Tylenol  and or ibuprofen  according to package instructions to help with pain and inflammation.  You may apply moist heat to your neck for 20 minutes at a time, 2-3 times a day, to help with pain and inflammation.  Follow the home physical therapy exercises prescribed in your discharge instructions.  If your symptoms are not improving, or new symptoms develop, either return for reevaluation or see your primary care provider     ED Prescriptions    None    PDMP not reviewed this encounter.   Bernardino Ditch, NP 04/01/24 1422    Bernardino Ditch, NP 04/01/24 [phone]

## 2024-04-01 NOTE — Discharge Instructions (Addendum)
 Your x-rays did not show any evidence of broken bones.  Your cervical fusion and disc spacer are intact.  Your exam is consistent with a cervical strain.  Use over-the-counter Tylenol  and or ibuprofen  according to package instructions to help with pain and inflammation.  You may apply moist heat to your neck for 20 minutes at a time, 2-3 times a day, to help with pain and inflammation.  Follow the home physical therapy exercises prescribed in your discharge instructions.  If your symptoms are not improving, or new symptoms develop, either return for reevaluation or see your primary care provider

## 2024-04-01 NOTE — ED Triage Notes (Signed)
 Pt states she fell out of the bed last night. She is unsure how. She states she woke up on her head. She is c/o bilateral shoulder pain, neck pain and headache. She states she is unable to move her neck from side to side. She states she has had neck surgery in the past.

## 2024-04-17 ENCOUNTER — Other Ambulatory Visit: Payer: Self-pay | Admitting: Otolaryngology

## 2024-04-19 ENCOUNTER — Encounter: Payer: Self-pay | Admitting: Anesthesiology

## 2024-05-10 ENCOUNTER — Ambulatory Visit: Admit: 2024-05-10 | Admitting: Otolaryngology

## 2024-05-10 SURGERY — SEPTOPLASTY, NOSE
Anesthesia: General | Laterality: Bilateral

## 2024-05-30 ENCOUNTER — Other Ambulatory Visit: Payer: Self-pay | Admitting: Otolaryngology

## 2024-06-07 ENCOUNTER — Encounter: Payer: Self-pay | Admitting: Otolaryngology

## 2024-06-12 NOTE — Discharge Instructions (Signed)

## 2024-06-14 ENCOUNTER — Encounter: Admission: RE | Disposition: A | Payer: Self-pay | Source: Home / Self Care | Attending: Otolaryngology

## 2024-06-14 ENCOUNTER — Ambulatory Visit: Payer: Self-pay | Admitting: Anesthesiology

## 2024-06-14 ENCOUNTER — Other Ambulatory Visit: Payer: Self-pay

## 2024-06-14 ENCOUNTER — Ambulatory Visit
Admission: RE | Admit: 2024-06-14 | Discharge: 2024-06-14 | Disposition: A | Discharge status: Home / Self Care | Attending: Otolaryngology | Admitting: Otolaryngology

## 2024-06-14 ENCOUNTER — Encounter: Payer: Self-pay | Admitting: Otolaryngology

## 2024-06-14 DIAGNOSIS — K219 Gastro-esophageal reflux disease without esophagitis: Secondary | ICD-10-CM | POA: Diagnosis not present

## 2024-06-14 DIAGNOSIS — F17200 Nicotine dependence, unspecified, uncomplicated: Secondary | ICD-10-CM | POA: Insufficient documentation

## 2024-06-14 DIAGNOSIS — I10 Essential (primary) hypertension: Secondary | ICD-10-CM | POA: Insufficient documentation

## 2024-06-14 DIAGNOSIS — J342 Deviated nasal septum: Secondary | ICD-10-CM | POA: Diagnosis present

## 2024-06-14 DIAGNOSIS — J343 Hypertrophy of nasal turbinates: Secondary | ICD-10-CM | POA: Insufficient documentation

## 2024-06-14 DIAGNOSIS — G4730 Sleep apnea, unspecified: Secondary | ICD-10-CM | POA: Diagnosis not present

## 2024-06-14 DIAGNOSIS — J45909 Unspecified asthma, uncomplicated: Secondary | ICD-10-CM | POA: Insufficient documentation

## 2024-06-14 HISTORY — PX: SEPTOPLASTY: SHX2393

## 2024-06-14 HISTORY — DX: Personal history of nicotine dependence: Z87.891

## 2024-06-14 HISTORY — DX: Gastro-esophageal reflux disease without esophagitis: K21.9

## 2024-06-14 HISTORY — PX: TURBINATE REDUCTION: SHX6157

## 2024-06-14 HISTORY — DX: Essential (primary) hypertension: I10

## 2024-06-14 HISTORY — DX: Anxiety disorder, unspecified: F41.9

## 2024-06-14 HISTORY — DX: Opioid dependence, uncomplicated: F11.20

## 2024-06-14 HISTORY — DX: Cardiac murmur, unspecified: R01.1

## 2024-06-14 HISTORY — DX: Depression, unspecified: F32.A

## 2024-06-14 SURGERY — SEPTOPLASTY, NOSE
Anesthesia: General | Site: Nose | Laterality: Bilateral

## 2024-06-14 MED ORDER — LIDOCAINE HCL (CARDIAC) PF 100 MG/5ML IV SOSY
PREFILLED_SYRINGE | INTRAVENOUS | Status: DC | PRN
  Administered 2024-06-14: 60 mg via INTRAVENOUS

## 2024-06-14 MED ORDER — CEFAZOLIN SODIUM-DEXTROSE 2-3 GM-%(50ML) IV SOLR
INTRAVENOUS | Status: AC
  Filled 2024-06-14: qty 50

## 2024-06-14 MED ORDER — MIDAZOLAM HCL 2 MG/2ML IJ SOLN
INTRAMUSCULAR | Status: AC
Start: 2024-06-14 — End: 2024-06-14
  Filled 2024-06-14: qty 2

## 2024-06-14 MED ORDER — LACTATED RINGERS IV SOLN: INTRAVENOUS | Status: DC

## 2024-06-14 MED ORDER — SUGAMMADEX SODIUM 200 MG/2ML IV SOLN
INTRAVENOUS | Status: DC | PRN
  Administered 2024-06-14: 101.6 mg via INTRAVENOUS

## 2024-06-14 MED ORDER — DEXAMETHASONE SODIUM PHOSPHATE 4 MG/ML IJ SOLN
INTRAMUSCULAR | Status: DC | PRN
  Administered 2024-06-14: 4 mg via INTRAVENOUS

## 2024-06-14 MED ORDER — HYDROCODONE-ACETAMINOPHEN 5-325 MG PO TABS: 1.0000 | ORAL_TABLET | ORAL | 0 refills | Status: AC | PRN

## 2024-06-14 MED ORDER — EPHEDRINE SULFATE (PRESSORS) 25 MG/5ML IV SOSY
PREFILLED_SYRINGE | INTRAVENOUS | Status: DC | PRN
  Administered 2024-06-14: 5 mg via INTRAVENOUS

## 2024-06-14 MED ORDER — CEPHALEXIN 500 MG PO CAPS: 500.0000 mg | ORAL_CAPSULE | Freq: Two times a day (BID) | ORAL | 0 refills | Status: AC

## 2024-06-14 MED ORDER — FENTANYL CITRATE (PF) 100 MCG/2ML IJ SOLN
INTRAMUSCULAR | Status: DC | PRN
  Administered 2024-06-14: 50 ug via INTRAVENOUS

## 2024-06-14 MED ORDER — PHENYLEPHRINE HCL 0.5 % NA SOLN
NASAL | Status: DC | PRN
  Administered 2024-06-14: 30 mL

## 2024-06-14 MED ORDER — MIDAZOLAM HCL 5 MG/5ML IJ SOLN
INTRAMUSCULAR | Status: DC | PRN
  Administered 2024-06-14: 2 mg via INTRAVENOUS

## 2024-06-14 MED ORDER — FENTANYL CITRATE (PF) 100 MCG/2ML IJ SOLN
INTRAMUSCULAR | Status: AC
  Filled 2024-06-14: qty 2

## 2024-06-14 MED ORDER — ONDANSETRON HCL 4 MG/2ML IJ SOLN
INTRAMUSCULAR | Status: AC
  Filled 2024-06-14: qty 2

## 2024-06-14 MED ORDER — ROCURONIUM BROMIDE 100 MG/10ML IV SOLN
INTRAVENOUS | Status: DC | PRN
  Administered 2024-06-14: 40 mg via INTRAVENOUS

## 2024-06-14 MED ORDER — ONDANSETRON HCL 4 MG/2ML IJ SOLN
INTRAMUSCULAR | Status: DC | PRN
Start: 2024-06-14 — End: 2024-06-14
  Administered 2024-06-14: 4 mg via INTRAVENOUS

## 2024-06-14 MED ORDER — DEXAMETHASONE SODIUM PHOSPHATE 4 MG/ML IJ SOLN
INTRAMUSCULAR | Status: AC
  Filled 2024-06-14: qty 1

## 2024-06-14 MED ORDER — PHENYLEPHRINE HCL (PRESSORS) 10 MG/ML IV SOLN
INTRAVENOUS | Status: DC | PRN
Start: 2024-06-14 — End: 2024-06-14
  Administered 2024-06-14: 80 ug via INTRAVENOUS
  Administered 2024-06-14: 100 ug via INTRAVENOUS

## 2024-06-14 MED ORDER — EPHEDRINE 5 MG/ML INJ
INTRAVENOUS | Status: AC
  Filled 2024-06-14: qty 5

## 2024-06-14 MED ORDER — OXYMETAZOLINE HCL 0.05 % NA SOLN
NASAL | Status: AC
  Filled 2024-06-14: qty 30

## 2024-06-14 MED ORDER — SUGAMMADEX SODIUM 200 MG/2ML IV SOLN
INTRAVENOUS | Status: AC
Start: 2024-06-14 — End: 2024-06-14
  Filled 2024-06-14: qty 2

## 2024-06-14 MED ORDER — DEXTROSE 5 % IV SOLN
2000.0000 mg | Freq: Once | INTRAVENOUS | Status: AC
  Administered 2024-06-14: 2000 mg via INTRAVENOUS

## 2024-06-14 MED ORDER — ROCURONIUM BROMIDE 10 MG/ML (PF) SYRINGE
PREFILLED_SYRINGE | INTRAVENOUS | Status: AC
  Filled 2024-06-14: qty 10

## 2024-06-14 MED ORDER — OXYMETAZOLINE HCL 0.05 % NA SOLN
2.0000 | Freq: Once | NASAL | Status: AC
  Administered 2024-06-14: 2 via NASAL

## 2024-06-14 MED ORDER — GLYCOPYRROLATE 0.2 MG/ML IJ SOLN: INTRAMUSCULAR | Status: DC | PRN

## 2024-06-14 MED ORDER — PHENYLEPHRINE 80 MCG/ML (10ML) SYRINGE FOR IV PUSH (FOR BLOOD PRESSURE SUPPORT)
PREFILLED_SYRINGE | INTRAVENOUS | Status: AC
  Filled 2024-06-14: qty 10

## 2024-06-14 MED ORDER — LIDOCAINE-EPINEPHRINE 1 %-1:100000 IJ SOLN
INTRAMUSCULAR | Status: DC | PRN
  Administered 2024-06-14: 1 mL

## 2024-06-14 MED ORDER — LIDOCAINE HCL (PF) 2 % IJ SOLN
INTRAMUSCULAR | Status: AC
  Filled 2024-06-14: qty 5

## 2024-06-14 MED ORDER — PROPOFOL 10 MG/ML IV BOLUS
INTRAVENOUS | Status: AC
  Filled 2024-06-14: qty 20

## 2024-06-14 MED ORDER — PROPOFOL 10 MG/ML IV BOLUS
INTRAVENOUS | Status: DC | PRN
  Administered 2024-06-14: 100 mg via INTRAVENOUS

## 2024-06-14 SURGICAL SUPPLY — 22 items
CANISTER SUCT 1200ML W/VALVE (MISCELLANEOUS) ×1 IMPLANT
COAGULATOR SUCT 8FR VV (MISCELLANEOUS) ×1 IMPLANT
ELECTRODE REM PT RTRN 9FT ADLT (ELECTROSURGICAL) ×1 IMPLANT
GLOVE SURG GAMMEX PI TX LF 7.5 (GLOVE) ×2 IMPLANT
GOWN STRL REUS W/ TWL LRG LVL3 (GOWN DISPOSABLE) ×1 IMPLANT
KIT TURNOVER KIT A (KITS) ×1 IMPLANT
NDL ANESTHESIA 27G X 3.5 (NEEDLE) ×1 IMPLANT
NDL HYPO 27GX1-1/4 (NEEDLE) ×1 IMPLANT
NEEDLE ANESTHESIA 27G X 3.5 (NEEDLE) ×1 IMPLANT
NEEDLE HYPO 27GX1-1/4 (NEEDLE) ×1 IMPLANT
PACK ANTI FOG SOFT WIPE (PACKS) IMPLANT
PACK ENT CUSTOM (PACKS) ×1 IMPLANT
PACKING NASAL EPIS 4X2.4 XEROG (MISCELLANEOUS) IMPLANT
PATTIES SURGICAL .5 X3 (DISPOSABLE) ×1 IMPLANT
SPLINT NASAL SEPTAL BLV .50 ST (MISCELLANEOUS) ×1 IMPLANT
SPONGE GAUZE 4X4 16PLY NS LF (MISCELLANEOUS) IMPLANT
STRAP BODY AND KNEE 60X3 (MISCELLANEOUS) ×1 IMPLANT
SUT ETHILON 3-0 KS 30 BLK (SUTURE) ×1 IMPLANT
SUT PLAIN GUT 4-0 (SUTURE) ×1 IMPLANT
SYR 3ML LL SCALE MARK (SYRINGE) ×1 IMPLANT
TOWEL OR 17X26 4PK STRL BLUE (TOWEL DISPOSABLE) ×1 IMPLANT
WATER STERILE IRR 250ML POUR (IV SOLUTION) ×1 IMPLANT

## 2024-06-14 NOTE — H&P (Signed)
H&P has been reviewed and patient reevaluated, no changes necessary. To be downloaded later.  

## 2024-06-14 NOTE — Anesthesia Postprocedure Evaluation (Signed)
 Anesthesia Post Note  Patient: FARHANA FELLOWS  Procedure(s) Performed: SEPTOPLASTY, NOSE (Bilateral: Nose) REDUCTION, NASAL TURBINATE (Bilateral: Nose)  Patient location during evaluation: PACU Anesthesia Type: General Level of consciousness: awake and alert Pain management: pain level controlled Vital Signs Assessment: post-procedure vital signs reviewed and stable Respiratory status: spontaneous breathing, nonlabored ventilation, respiratory function stable and patient connected to nasal cannula oxygen Cardiovascular status: blood pressure returned to baseline and stable Postop Assessment: no apparent nausea or vomiting Anesthetic complications: no   No notable events documented.   Last Vitals:  Vitals:   06/14/24 1030 06/14/24 1035  BP: (!) 140/77 136/77  Pulse: 85 85  Resp: 14 20  Temp:  36.6 C  SpO2: 100% 95%    Last Pain:  Vitals:   06/14/24 1035  TempSrc:   PainSc: 0-No pain                 Debby Mines

## 2024-06-14 NOTE — Anesthesia Procedure Notes (Signed)
 Procedure Name: Intubation Date/Time: 06/14/2024 8:53 AM  Performed by: Elly Pfeiffer, CRNAPre-anesthesia Checklist: Patient identified, Emergency Drugs available, Suction available and Patient being monitored Patient Re-evaluated:Patient Re-evaluated prior to induction Oxygen Delivery Method: Circle system utilized Preoxygenation: Pre-oxygenation with 100% oxygen Induction Type: IV induction Ventilation: Mask ventilation without difficulty Laryngoscope Size: Mac and 3 Grade View: Grade II Tube type: Oral Tube size: 6.5 mm Number of attempts: 1 Airway Equipment and Method: Stylet and Oral airway Placement Confirmation: ETT inserted through vocal cords under direct vision, positive ETCO2 and breath sounds checked- equal and bilateral Secured at: 21 cm Tube secured with: Tape Dental Injury: Teeth and Oropharynx as per pre-operative assessment  Comments: Cords clear; no trauma. CA

## 2024-06-14 NOTE — Op Note (Signed)
 06/14/2024  10:03 AM  [id number]   Pre-Op Dx:  Deviated Nasal Septum, Hypertrophic Inferior Turbinates  Post-op Dx: Same  Proc: Nasal Septoplasty, Bilateral Partial Reduction Inferior Turbinates   Surg:  Tara Craig  Anes:  GOT  EBL: 50 mL  Comp: None  Findings: Septum deviated to left with a large left vomer spur.  Enlarged inferior turbinates and lateralized middle turbinates  Procedure: With the patient in a comfortable supine position,  general orotracheal anesthesia was induced without difficulty.     The patient received preoperative Afrin spray for topical decongestion and vasoconstriction.  Intravenous prophylactic antibiotics were administered.  At an appropriate level, the patient was placed in a semi-sitting position.  Nasal vibrissae were trimmed.   1% Xylocaine  with 1:100,000 epinephrine , 4.5 cc's, was infiltrated into the anterior floor of the nose, into the nasal spine region, into the membranous columella, and finally into the submucoperichondrial plane of the septum on both sides.  Several minutes were allowed for this to take effect.  Cottoniod pledgetts soaked in Afrin and 4% Xylocaine  were placed into both nasal cavities and left while the patient was prepped and draped in the standard fashion.  The materials were removed from the nose and observed to be intact and correct in number.  The nose was inspected with a headlight and zero degree scope with the findings as described above.  A left Killian incision was sharply executed and carried down to the quadrangular cartilage. The mucoperichondrium was elelvated along the quadrangular plate back to the bony-cartilaginous junction. The mucoperiostium was then elevated along the ethmoid plate and the vomer. The boney-catilaginous junction was then split with a freer elevator and the mucoperiosteum was elevated on the opposite side. The mucoperiosteum was then elevated along the maxillary crest as needed to expose the  crooked bone of the crest.  Boney spurs of the vomer and maxillary crest were removed with Rudean forceps.  The cartilaginous plate was trimmed along its posterior and inferior borders of about 2 mm of cartilage to free it up inferiorly. Some of the deviated ethmoid plate was then fractured and removed with Takahashi forceps to free up the posterior border of the quadrangular plate and allow it to swing back to the midline. The mucosal flaps were placed back into their anatomic position to allow visualization of the airways. The septum now sat in the midline with an improved airway.  A 3-0 Chromic suture on a Keith needle in used to anchor the inferior septum at the nasal spine with a through and through suture. The mucosal flaps are then sutured together using a through and through whip stitch of 4-0 Plain Gut with a mini-Keith needle. This was used to close the [place] incision as well.   The inferior turbinates were then inspected. An incision was created along the inferior aspect of the left inferior turbinate with removal of some of the inferior soft tissue and bone. Electrocautery was used to control bleeding in the area. The remaining turbinate was then outfractured to open up the airway further. There was no significant bleeding noted. The right turbinate was then trimmed and outfractured in a similar fashion.  The middle turbinates were then evaluated with the 0 degree scope.  On both sides middle turbinate had paradoxical curves and were lateralized and blocking much of the middle meatus.  On both sides the middle turbinate was infractured towards the midline and then some xerogel was placed in the middle meatus to help keep the middle  turbinate from lateralizing again.  These were soaked with water  to get them to soften.  The airways were then visualized and showed open passageways on both sides that were significantly improved compared to before surgery. There was no signifcant bleeding.  Nasal splints were applied to both sides of the septum using Xomed 0.3mm regular sized splints that were trimmed, and then held in position with a 3-0 Nylon through and through suture.  The patient was turned back over to anesthesia, and awakened, extubated, and taken to the PACU in satisfactory condition.  Dispo:   PACU to home  Plan: Ice, elevation, narcotic analgesia, steroid taper, and prophylactic antibiotics for the duration of indwelling nasal foreign bodies.  We will reevaluate the patient in the office in 6 days and remove the septal splints.  Return to work in 10 days, strenuous activities in two weeks.   Tara Craig 06/14/2024 10:03 AM

## 2024-06-14 NOTE — Anesthesia Preprocedure Evaluation (Signed)
 Anesthesia Evaluation  Patient identified by MRN, date of birth, ID band Patient awake    Reviewed: Allergy & Precautions, NPO status , Patient's Chart, lab work & pertinent test results  History of Anesthesia Complications Negative for: history of anesthetic complications  Airway Mallampati: III  TM Distance: >3 FB Neck ROM: full    Dental  (+) Chipped, Dental Advidsory Given   Pulmonary asthma , sleep apnea , Current Smoker   Pulmonary exam normal        Cardiovascular hypertension, On Medications + dysrhythmias Supra Ventricular Tachycardia   EKG 7/19 Sinus tachycardia Paired ventricular premature complexes Nonspecific intraventricular conduction delay Borderline ST depression, diffuse leads   Neuro/Psych  PSYCHIATRIC DISORDERS Anxiety Depression     Neuromuscular disease    GI/Hepatic ,GERD  Controlled,,(+)     substance abuse (hx of opiod dependence on suboxone )    Endo/Other  negative endocrine ROS    Renal/GU      Musculoskeletal   Abdominal   Peds  Hematology  (+) Blood dyscrasia, anemia   Anesthesia Other Findings Past Medical History: No date: Arthritis No date: Asthma No date: Back pain, chronic No date: Bilateral foot-drop No date: Skin cancer  Past Surgical History: No date: ABDOMINAL HYSTERECTOMY No date: APPENDECTOMY 2010: BACK SURGERY No date: TENNIS ELBOW RELEASE/NIRSCHEL PROCEDURE  BMI    Body Mass Index: 24.54 kg/m      Reproductive/Obstetrics negative OB ROS                              Anesthesia Physical Anesthesia Plan  ASA: 3  Anesthesia Plan: General ETT   Post-op Pain Management:    Induction: Intravenous  PONV Risk Score and Plan: 3 and Ondansetron , Dexamethasone and Midazolam   Airway Management Planned: Oral ETT  Additional Equipment:   Intra-op Plan:   Post-operative Plan: Extubation in OR  Informed Consent: I have reviewed  the patients History and Physical, chart, labs and discussed the procedure including the risks, benefits and alternatives for the proposed anesthesia with the patient or authorized representative who has indicated his/her understanding and acceptance.     Dental Advisory Given  Plan Discussed with: Anesthesiologist, CRNA and Surgeon  Anesthesia Plan Comments: (Patient consented for risks of anesthesia including but not limited to:  - adverse reactions to medications - damage to eyes, teeth, lips or other oral mucosa - nerve damage due to positioning  - sore throat or hoarseness - Damage to heart, brain, nerves, lungs, other parts of body or loss of life  Patient voiced understanding and assent.)        Anesthesia Quick Evaluation

## 2024-06-14 NOTE — Transfer of Care (Signed)
 Immediate Anesthesia Transfer of Care Note  Patient: Tara Craig  Procedure(s) Performed: SEPTOPLASTY, NOSE (Bilateral: Nose) REDUCTION, NASAL TURBINATE (Bilateral: Nose)  Patient Location: PACU  Anesthesia Type: General ETT  Level of Consciousness: awake, alert  and patient cooperative  Airway and Oxygen Therapy: Patient Spontanous Breathing and Patient connected to supplemental oxygen  Post-op Assessment: Post-op Vital signs reviewed, Patient's Cardiovascular Status Stable, Respiratory Function Stable, Patent Airway and No signs of Nausea or vomiting  Post-op Vital Signs: Reviewed and stable  Complications: No notable events documented.
